# Patient Record
Sex: Female | Born: 1935 | Race: White | Hispanic: No | Smoking: Never smoker
Health system: Southern US, Community
[De-identification: ages and names within clinical notes are randomized; demographics above are authoritative.]

## PROBLEM LIST (undated history)

## (undated) DIAGNOSIS — R011 Cardiac murmur, unspecified: Secondary | ICD-10-CM

## (undated) DIAGNOSIS — I429 Cardiomyopathy, unspecified: Secondary | ICD-10-CM

## (undated) DIAGNOSIS — N183 Chronic kidney disease, stage 3 unspecified: Secondary | ICD-10-CM

## (undated) DIAGNOSIS — C55 Malignant neoplasm of uterus, part unspecified: Secondary | ICD-10-CM

## (undated) DIAGNOSIS — I482 Chronic atrial fibrillation, unspecified: Secondary | ICD-10-CM

## (undated) DIAGNOSIS — Z860100 Personal history of colon polyps, unspecified: Secondary | ICD-10-CM

## (undated) DIAGNOSIS — I499 Cardiac arrhythmia, unspecified: Secondary | ICD-10-CM

## (undated) DIAGNOSIS — I340 Nonrheumatic mitral (valve) insufficiency: Secondary | ICD-10-CM

## (undated) DIAGNOSIS — I071 Rheumatic tricuspid insufficiency: Secondary | ICD-10-CM

## (undated) DIAGNOSIS — I739 Peripheral vascular disease, unspecified: Secondary | ICD-10-CM

## (undated) DIAGNOSIS — I779 Disorder of arteries and arterioles, unspecified: Secondary | ICD-10-CM

## (undated) DIAGNOSIS — I493 Ventricular premature depolarization: Secondary | ICD-10-CM

## (undated) DIAGNOSIS — I10 Essential (primary) hypertension: Secondary | ICD-10-CM

## (undated) DIAGNOSIS — E785 Hyperlipidemia, unspecified: Secondary | ICD-10-CM

## (undated) DIAGNOSIS — I83813 Varicose veins of bilateral lower extremities with pain: Secondary | ICD-10-CM

## (undated) DIAGNOSIS — E039 Hypothyroidism, unspecified: Secondary | ICD-10-CM

## (undated) DIAGNOSIS — I5022 Chronic systolic (congestive) heart failure: Secondary | ICD-10-CM

## (undated) HISTORY — DX: Chronic kidney disease, stage 3 unspecified: N18.30

## (undated) HISTORY — DX: Peripheral vascular disease, unspecified: I73.9

## (undated) HISTORY — DX: Cardiomyopathy, unspecified: I42.9

## (undated) HISTORY — PX: APPENDECTOMY: SHX54

## (undated) HISTORY — PX: EYE SURGERY: SHX253

## (undated) HISTORY — DX: Ventricular premature depolarization: I49.3

## (undated) HISTORY — DX: Nonrheumatic mitral (valve) insufficiency: I34.0

## (undated) HISTORY — PX: PARTIAL COLECTOMY: SHX5273

## (undated) HISTORY — DX: Hyperlipidemia, unspecified: E78.5

## (undated) HISTORY — DX: Personal history of colon polyps, unspecified: Z86.0100

## (undated) HISTORY — DX: Chronic systolic (congestive) heart failure: I50.22

## (undated) HISTORY — DX: Personal history of colonic polyps: Z86.010

## (undated) HISTORY — DX: Chronic atrial fibrillation, unspecified: I48.20

## (undated) HISTORY — PX: CATARACT EXTRACTION W/ INTRAOCULAR LENS IMPLANT: SHX1309

## (undated) HISTORY — DX: Chronic kidney disease, stage 3 (moderate): N18.3

## (undated) HISTORY — PX: ABDOMINAL HYSTERECTOMY: SHX81

## (undated) HISTORY — PX: COLON SURGERY: SHX602

## (undated) HISTORY — DX: Disorder of arteries and arterioles, unspecified: I77.9

## (undated) HISTORY — PX: ABDOMINAL HYSTERECTOMY: SUR658

## (undated) HISTORY — DX: Malignant neoplasm of uterus, part unspecified: C55

## (undated) HISTORY — PX: TONSILLECTOMY: SUR1361

## (undated) HISTORY — DX: Rheumatic tricuspid insufficiency: I07.1

## (undated) HISTORY — DX: Essential (primary) hypertension: I10

---

## 2007-03-27 ENCOUNTER — Ambulatory Visit: Payer: Self-pay | Admitting: Cardiology

## 2007-04-04 ENCOUNTER — Ambulatory Visit: Payer: Self-pay

## 2007-04-04 ENCOUNTER — Encounter: Payer: Self-pay | Admitting: Cardiology

## 2007-04-26 ENCOUNTER — Ambulatory Visit: Payer: Self-pay | Admitting: Cardiology

## 2010-01-14 DIAGNOSIS — I10 Essential (primary) hypertension: Secondary | ICD-10-CM

## 2010-01-14 DIAGNOSIS — E785 Hyperlipidemia, unspecified: Secondary | ICD-10-CM | POA: Insufficient documentation

## 2010-01-15 ENCOUNTER — Ambulatory Visit: Payer: Self-pay | Admitting: Cardiology

## 2010-01-15 DIAGNOSIS — R002 Palpitations: Secondary | ICD-10-CM

## 2010-01-27 ENCOUNTER — Ambulatory Visit: Payer: Self-pay | Admitting: Cardiology

## 2010-01-27 DIAGNOSIS — R0989 Other specified symptoms and signs involving the circulatory and respiratory systems: Secondary | ICD-10-CM | POA: Insufficient documentation

## 2010-01-27 DIAGNOSIS — I482 Chronic atrial fibrillation, unspecified: Secondary | ICD-10-CM

## 2010-01-27 DIAGNOSIS — I498 Other specified cardiac arrhythmias: Secondary | ICD-10-CM

## 2010-01-28 ENCOUNTER — Telehealth: Payer: Self-pay | Admitting: Cardiology

## 2010-02-08 ENCOUNTER — Telehealth (INDEPENDENT_AMBULATORY_CARE_PROVIDER_SITE_OTHER): Payer: Self-pay | Admitting: *Deleted

## 2010-02-09 ENCOUNTER — Encounter: Payer: Self-pay | Admitting: Cardiology

## 2010-02-09 ENCOUNTER — Ambulatory Visit: Payer: Self-pay

## 2010-02-09 ENCOUNTER — Encounter (HOSPITAL_COMMUNITY): Admission: RE | Admit: 2010-02-09 | Discharge: 2010-04-23 | Payer: Self-pay | Admitting: Cardiology

## 2010-02-09 ENCOUNTER — Ambulatory Visit: Payer: Self-pay | Admitting: Cardiovascular Disease

## 2010-02-09 ENCOUNTER — Ambulatory Visit (HOSPITAL_COMMUNITY): Admission: RE | Admit: 2010-02-09 | Discharge: 2010-02-09 | Payer: Self-pay | Admitting: Gastroenterology

## 2010-02-10 ENCOUNTER — Ambulatory Visit: Payer: Self-pay | Admitting: Cardiology

## 2010-02-13 ENCOUNTER — Telehealth: Payer: Self-pay | Admitting: Physician Assistant

## 2010-02-15 ENCOUNTER — Ambulatory Visit: Payer: Self-pay | Admitting: Cardiovascular Disease

## 2010-02-15 ENCOUNTER — Telehealth: Payer: Self-pay | Admitting: Cardiology

## 2010-02-15 LAB — CONVERTED CEMR LAB

## 2010-02-17 ENCOUNTER — Telehealth (INDEPENDENT_AMBULATORY_CARE_PROVIDER_SITE_OTHER): Payer: Self-pay | Admitting: *Deleted

## 2010-02-19 ENCOUNTER — Ambulatory Visit: Payer: Self-pay | Admitting: Internal Medicine

## 2010-02-19 LAB — CONVERTED CEMR LAB: POC INR: 2

## 2010-02-25 ENCOUNTER — Ambulatory Visit: Payer: Self-pay | Admitting: Cardiology

## 2010-02-25 LAB — CONVERTED CEMR LAB: POC INR: 3

## 2010-03-03 ENCOUNTER — Ambulatory Visit: Payer: Self-pay | Admitting: Cardiology

## 2010-03-11 ENCOUNTER — Ambulatory Visit: Payer: Self-pay | Admitting: Internal Medicine

## 2010-03-31 ENCOUNTER — Encounter: Payer: Self-pay | Admitting: Cardiology

## 2010-03-31 ENCOUNTER — Ambulatory Visit: Payer: Self-pay | Admitting: Cardiology

## 2010-07-13 ENCOUNTER — Ambulatory Visit: Payer: Self-pay | Admitting: Cardiology

## 2010-08-04 ENCOUNTER — Telehealth: Payer: Self-pay | Admitting: Cardiology

## 2010-10-06 ENCOUNTER — Telehealth: Payer: Self-pay | Admitting: Cardiology

## 2010-10-12 ENCOUNTER — Telehealth: Payer: Self-pay | Admitting: Cardiology

## 2010-11-21 LAB — CONVERTED CEMR LAB
AST: 24 units/L (ref 0–37)
Albumin: 3.9 g/dL (ref 3.5–5.2)
BUN: 18 mg/dL (ref 6–23)
Basophils Absolute: 0 10*3/uL (ref 0.0–0.1)
CO2: 31 meq/L (ref 19–32)
Chloride: 106 meq/L (ref 96–112)
Eosinophils Absolute: 0.1 10*3/uL (ref 0.0–0.7)
GFR calc non Af Amer: 73.85 mL/min (ref >60)
Glucose, Bld: 90 mg/dL (ref 70–99)
HCT: 41.4 % (ref 36.0–46.0)
Hemoglobin: 13.9 g/dL (ref 12.0–15.0)
Lymphs Abs: 1.3 10*3/uL (ref 0.7–4.0)
MCHC: 33.7 g/dL (ref 30.0–36.0)
MCV: 97 fL (ref 78.0–100.0)
Monocytes Absolute: 0.5 10*3/uL (ref 0.1–1.0)
Neutro Abs: 3.7 10*3/uL (ref 1.4–7.7)
Platelets: 184 10*3/uL (ref 150.0–400.0)
Potassium: 3.9 meq/L (ref 3.5–5.1)
RDW: 12.1 % (ref 11.5–14.6)
Sodium: 144 meq/L (ref 135–145)
TSH: 3.56 microintl units/mL (ref 0.35–5.50)
Total Bilirubin: 0.7 mg/dL (ref 0.3–1.2)

## 2010-11-24 NOTE — Medication Information (Signed)
Summary: rov/tm  Anticoagulant Therapy  Managed by: Charolotte Eke, PharmD Referring MD: B.Juanda Chance PCP: Dr Marjory Sneddon MD: Shirlee Latch MD, Freida Busman Indication 1: Atrial Fibrillation Lab Used: [facility] [place] Site: [address] INR POC 3.0 INR RANGE 2.0-3.0  Dietary changes: no    Health status changes: no    Bleeding/hemorrhagic complications: no    Recent/future hospitalizations: no    Any changes in medication regimen? no    Recent/future dental: no  Any missed doses?: no       Is patient compliant with meds? yes      Comments: Interested in being dosed by Dr. Gwendlyn Deutscher at [facility] in [place].  Also, interested in reading about Pradaxa.  Gave her Carenotes.  Current Medications (verified): 1)  Crestor 10 Mg Tabs (Rosuvastatin Calcium) .... Take One Tablet By Mouth Daily. 2)  Cardizem Cd 180 Mg Xr24h-Cap (Diltiazem Hcl Coated Beads) .... Take One Tab By Mouth Once Daily 3)  Fish Oil 1200mg  .... 1 Tab Once Daily 4)  Calicum With Vit D .... As Needed 5)  Vitamin C .... As Needed 6)  Co Q 10 .... Prn 7)  Coumadin 5 Mg Tabs (Warfarin Sodium) .... Take One Tab By Mouth Once Daily As Directed 8)  Cozaar 50 Mg Tabs (Losartan Potassium) .... Take 1 Tablet By Mouth Once A Day For Blood Pressure 9)  Aspirin 81 Mg Tbec (Aspirin) .... Take One Tablet By Mouth Daily 10)  Flecainide Acetate 50 Mg Tabs (Flecainide Acetate) .... Take One Tablet By Mouth Every 12 Hours  Allergies (verified): 1)  ! * Tussionex  Anticoagulation Management History:      The patient is taking warfarin and comes in today for a routine follow up visit.  Positive risk factors for bleeding include an age of 75 years or older.  The bleeding index is 'intermediate risk'.  Positive CHADS2 values include History of HTN and Age > 50 years old.  Anticoagulation responsible provider: Shirlee Latch MD, Gidget Quizhpi.  INR POC: 3.0.  Cuvette Lot#: [id number].  Exp: 03/2011.     Anticoagulation Management Assessment/Plan:      The patient's current anticoagulation dose is Coumadin 5 mg tabs: take one tab by mouth once daily as directed.  The target INR is 2.0-3.0.  The next INR is due 03/11/2010.  Anticoagulation instructions were given to patient.  Results were reviewed/authorized by Charolotte Eke, PharmD.  She was notified by Charolotte Eke, PharmD.         Prior Anticoagulation Instructions: INR 2.0 Continue 5mg s daily. Reccheck in one week.   Current Anticoagulation Instructions: Continue 5mg  daily except take 2.5mg  every Thurs.

## 2010-11-24 NOTE — Assessment & Plan Note (Signed)
Summary: f2y   Visit Type:  Follow-up Primary Provider:  Dr Jeanie Sewer  CC:  fatigue.  History of Present Illness: the patient is 77 years old returned for a followup visit because of symptoms of palpitations. I saw her last in July of 2008. She had hypertension and left bundle branch block and symptoms of fatigue. We have added her with a Myoview scan which was negative and an echocardiogram which was normal.  She's done fairly well since that time but says over the last 6-12 months she has had recurrent symptoms of palpitations. She notices this most when she takes her blood pressure. These may occur every 2 weeks. She's also had fatigue but this has improved over the last 2 weeks. She's had no chest pain.  Problems Prior to Update: 1)  Palpitations  (ICD-785.1) 2)  Hyperlipidemia  (ICD-272.4) 3)  Hypertension  (ICD-401.9)  Current Medications (verified): 1)  Lisinopril-Hydrochlorothiazide 20-25 Mg Tabs (Lisinopril-Hydrochlorothiazide) .... 1/4 Tab By Mouth Once Daily 2)  Aspirin Ec 325 Mg Tbec (Aspirin) .... As Needed  Past History:  Past Medical History: Reviewed history from 01/14/2010 and no changes required. Current Problems:  HYPERLIPIDEMIA (ICD-272.4) HYPERTENSION (ICD-401.9)  Vital Signs:  Patient profile:   77 year old female Height:      65 inches Weight:      132 pounds BMI:     22.05 Pulse rate:   59 / minute Resp:     12 per minute BP sitting:   160 / 90  (left arm)  Vitals Entered By: Kem Parkinson (January 15, 2010 11:39 AM)   Other Orders: EKG w/ Interpretation (93000) Event (Event) TLB-BMP (Basic Metabolic Panel-BMET) (80048-METABOL) TLB-CBC Platelet - w/Differential (85025-CBCD) TLB-Hepatic/Liver Function Pnl (80076-HEPATIC) TLB-TSH (Thyroid Stimulating Hormone) (63875-IEP)  Patient Instructions: 1)  Your physician recommends that you schedule a follow-up appointment in: 4 weeks. 2)  Your physician has recommended that you wear an event monitor.   Event monitors are medical devices that record the heart's electrical activity. Doctors most often use these monitors to diagnose arrhythmias. Arrhythmias are problems with the speed or rhythm of the heartbeat. The monitor is a small, portable device. You can wear one while you do your normal daily activities. This is usually used to diagnose what is causing palpitations/syncope (passing out). 3)  Your physician recommends that you have lab work today: cbc/bmp/liver/tsh (785.1;402.10;780.79)  Appended Document: f2y Impression 1.  Palpitations:  Etiol unclear.  We will evaluate with event monitor 2.  Hypertensio:  Well controlled on current meds. 3.  Hyperlipidemia:   Currently managed with statin

## 2010-11-24 NOTE — Assessment & Plan Note (Signed)
Summary: new afib on event.amber   Visit Type:  Follow-up Primary Provider:  Dr Jeanie Sewer  CC:  new on se afib.  History of Present Illness: H. and is 77 years old and returned for evaluation and management of palpitations. I saw her a few weeks ago for symptoms of palpitations and we arranged for her to have an event monitor. This showed runs of SVT some of which were irregular and some of which were regular. Her baseline QRS has a wide complex. I'm not sure where the arrhythmia represents a multifocally to tachycardia and a reentrant supraventricular tachycardia or atrial flutter or whether this represents atrial fibrillation.  She is not having too many symptoms from the palpitations but when they occur they last 5-10 minutes.  She has a fairly high risk profile for vascular disease. She has hypertension and hyperlipidemia and she has a son who died of a heart attack in his 30s. She also has a right carotid bruit.  Current Medications (verified): 1)  Lisinopril-Hydrochlorothiazide 20-25 Mg Tabs (Lisinopril-Hydrochlorothiazide) .... 1/4 Tab By Mouth Once Daily 2)  Aspirin Ec 325 Mg Tbec (Aspirin) .... As Needed  Allergies (verified): No Known Drug Allergies  Past History:  Past Medical History: Reviewed history from 01/14/2010 and no changes required. Current Problems:  HYPERLIPIDEMIA (ICD-272.4) HYPERTENSION (ICD-401.9)  Review of Systems       ROS is negative except as outlined in HPI.   Vital Signs:  Patient profile:   77 year old female Height:      65 inches Weight:      135 pounds Pulse rate:   71 / minute BP sitting:   182 / 85 Cuff size:   regular  Vitals Entered By: Burnett Kanaris, CNA (January 27, 2010 3:48 PM)  Physical Exam  Additional Exam:  Gen. Well-nourished, in no distress   Neck: No JVD, thyroid not enlarged, right carotid bruit Lungs: No tachypnea, clear without rales, rhonchi or wheezes Cardiovascular: Rhythm regular, PMI not displaced,  heart sounds   normal, no murmurs or gallops, no peripheral edema, pulses normal in all 4 extremities. Abdomen: BS normal, abdomen soft and non-tender without masses or organomegaly, no hepatosplenomegaly. MS: No deformities, no cyanosis or clubbing   Neuro:  No focal sns   Skin:  no lesions    Impression & Recommendations:  Problem # 1:  SUPRAVENTRICULAR TACHYCARDIA (ICD-427.89) She has palpitations and documented supraventricular tachycardia on event monitor tracings. I'm not certain whether this represents a multifocal atrial tachycardia plus a reentrant SVT or atrial flutter or whether this represents some atrial fibrillation. We will continue the event monitor and get some more tracings. We will start her on Cardizem CD 180 daily and will start low-dose lisinopril hydrochlorothiazide. We will continue the aspirin and we will consider Coumadin after we have some more tracings. She has some reluctance against Coumadin and we discussed Pradaxa  as well.  We will also reevaluate her for structural ischemic heart disease. We will get a exercise stress test Myoview scan, a 2-D echocardiogram.  Her updated medication list for this problem includes:    Lisinopril-hydrochlorothiazide 20-25 Mg Tabs (Lisinopril-hydrochlorothiazide) .Marland Kitchen... 1/4 tab by mouth once daily    Aspirin Ec 325 Mg Tbec (Aspirin) .Marland Kitchen... As needed  Problem # 2:  CAROTID BRUIT, RIGHT (ICD-785.9) Shas a right carotid bruit and we'll get carotid Dopplers. Orders: Carotid Duplex (Carotid Duplex)  Problem # 3:  HYPERTENSION (ICD-401.9)  Hhypertension is poorly controlled. I think we can use a calcium  channel blocker both for her blood pressure and for treatment of her arrhythmia and we'll start Cardizem CD 180 daily and titrate as needed. The following medications were removed from the medication list:    Lisinopril-hydrochlorothiazide 20-25 Mg Tabs (Lisinopril-hydrochlorothiazide) .Marland Kitchen... 1/4 tab by mouth once daily Her updated medication list  for this problem includes:    Aspirin Ec 325 Mg Tbec (Aspirin) .Marland Kitchen... As needed    Cardizem Cd 180 Mg Xr24h-cap (Diltiazem hcl coated beads) .Marland Kitchen... Take one tab by mouth once daily  Orders: Nuclear Stress Test (Nuc Stress Test) Echocardiogram (Echo) Carotid Duplex (Carotid Duplex)  The following medications were removed from the medication list:    Lisinopril-hydrochlorothiazide 20-25 Mg Tabs (Lisinopril-hydrochlorothiazide) .Marland Kitchen... 1/4 tab by mouth once daily Her updated medication list for this problem includes:    Aspirin Ec 325 Mg Tbec (Aspirin) .Marland Kitchen... As needed    Cardizem Cd 180 Mg Xr24h-cap (Diltiazem hcl coated beads) .Marland Kitchen... Take one tab by mouth once daily  Problem # 4:  HYPERLIPIDEMIA (ICD-272.4) She was told by Dr. Jeanie Sewer that her cholesterol is greater than 300 and he gave her some Crestor samples. She was concerned that this might affect her liver and didn't take it. She has a very strong risk profile has a carotid bruit venous engorgement she be treated with a statin and I encouraged her to begin the Crestor. Her updated medication list for this problem includes:    Crestor 10 Mg Tabs (Rosuvastatin calcium) .Marland Kitchen... Take one tablet by mouth daily.  Patient Instructions: 1)  Your physician recommends that you schedule a follow-up appointment in: 2 weeks as scheduled. 2)  Your physician has recommended you make the following change in your medication: 1) STOP lisinopril/hctz, 2) START Cardizem CD 180mg  once daily 3)  Your physician has requested that you have a carotid duplex. This test is an ultrasound of the carotid arteries in your neck. It looks at blood flow through these arteries that supply the brain with blood. Allow one hour for this exam. There are no restrictions or special instructions. 4)  Your physician has requested that you have an echocardiogram.  Echocardiography is a painless test that uses sound waves to create images of your heart. It provides your doctor with  information about the size and shape of your heart and how well your heart's chambers and valves are working.  This procedure takes approximately one hour. There are no restrictions for this procedure. 5)  Your physician has requested that you have an exercise stress myoview.  For further information please visit [email or web address]  Please follow instruction sheet, as given. Prescriptions: CRESTOR 10 MG TABS (ROSUVASTATIN CALCIUM) Take one tablet by mouth daily.  #35 x 0   Entered by:   Sherri Rad, RN, BSN   Authorized by:   Lenoria Farrier, MD, [facility]   Signed by:   Lenoria Farrier, MD, [facility] on 01/27/2010   Method used:   Samples Given   RxID:   [id number] CARDIZEM CD 180 MG XR24H-CAP (DILTIAZEM HCL COATED BEADS) take one tab by mouth once daily  #30 x 6   Entered by:   Sherri Rad, RN, BSN   Authorized by:   Lenoria Farrier, MD, [facility]   Signed by:   Sherri Rad, RN, BSN on 01/27/2010   Method used:   Electronically to        [facility]. [phone]* (retail)       [address]  [place], [place]  [address]       Ph: [phone] or [phone]       Fax: [phone]   RxID:   [id number]

## 2010-11-24 NOTE — Progress Notes (Signed)
Summary: MEDICATION MAKING PT DIZZY   Phone Note Call from Patient Call back at Home Phone [phone]   Caller: Patient Summary of Call: PT CALLING ABOUT MEDICATION  HER DIZZY (CARTIA) Initial call taken by: Judie Grieve,  January 28, 2010 4:51 PM  Follow-up for Phone Call        I called and spoke with the pt. She states she took her Cartia this morning about 9:30am/ 10:00am. She has had bouts of severe dizziness today that are intermittent. They have improved through the afternoon. The pt states she took her BP a few minutes ago and it was 142/82 and she states her HR was good. She was not dizzy when she took her most recent BP. I instructed the pt to take her bp tonight if she feels an episode of dizziness setting in. I will call her in the morning to find out how her readings are and then we will decide about her meds.  Follow-up by: Sherri Rad, RN, BSN,  January 28, 2010 5:25 PM  Additional Follow-up for Phone Call Additional follow up Details #1::        I called and spoke with the pt this morning to followup on her BP readings. She took several readings last night. 5:45pm- 171/95, 6:45pm- 186/98, 7:45pm- 201/98, 8:45pm- 207/107& this morning at  8:3am- 203/101. She just recently rechecked her bp and it was 154/85. The pt states her HR's have averaged between 62-69 bpm. The pt has not taken her Cartia this morning. She does feel much better than yesterday, but still feels a little dizzy. The pt also states that when she was taking the lisinopril/hctz 20/25mg  tab, that she could only take a 1/4 of a tablet due to weakness. I explained to the pt I will address this with the DOD and call her back. Additional Follow-up by: Sherri Rad, RN, BSN,  January 29, 2010 10:47 AM    Additional Follow-up for Phone Call Additional follow up Details #2::    I discussed the pt's situation with Dr. Graciela Husbands and had him review the pt's strips from her event monitor. Since the pt has elevated BP  readings and also has what looks like an atrial tach per Dr. Graciela Husbands, orders from Dr. Graciela Husbands are as follows: 1) have the pt take her Cardizem 180mg  evening night before bed. If she still feels bad, the pt should call on monday. 2) The next step would be to put her back on lisinopril/hctz 20/25mg  1/4 tab two times a day, if that is not good for her then 3) try diltiazem 30mg  three times a day, & a last resort would be 4) flecainide 30mg . I have attempted to call the pt about this. I have left a message that I will try to call her back. Sherri Rad, RN, BSN  January 29, 2010 11:14 AM   I called the pt back and made her aware to take her Cardizem CD 180mg  at bedtime per Dr. Graciela Husbands. I also explained to her that if she does not feel well on monday to please call us and we will try another option per Dr. Graciela Husbands. The pt is agreeable. Follow-up by: Sherri Rad, RN, BSN,  January 29, 2010 3:38 PM

## 2010-11-24 NOTE — Progress Notes (Signed)
Summary: refill  Phone Note Refill Request Message from:  Patient on August 04, 2010 8:52 AM  Refills Requested: Medication #1:  COUMADIN 5 MG TABS take one tab by mouth once daily as directed [facility] [phone]  Initial call taken by: Judie Grieve,  August 04, 2010 8:52 AM  Follow-up for Phone Call        Spoke with pt and she states she is still f/u with [facility] in [place] for INR. Thus, she was instructed that they would have to refill due to the fact that they were now dosing. Understanding verbalized.  Follow-up by: Bethena Midget, RN, BSN,  August 04, 2010 4:48 PM

## 2010-11-24 NOTE — Medication Information (Signed)
Summary: rov-tp  Anticoagulant Therapy  Managed by: Eda Keys, PharmD Referring MD: B.Juanda Chance PCP: Dr Marjory Sneddon MD: Gala Romney MD, Reuel Boom Indication 1: Atrial Fibrillation Lab Used: [facility] [place] Site: [address] INR POC 2.2 INR RANGE 2.0-3.0  Dietary changes: no    Health status changes: no    Bleeding/hemorrhagic complications: no    Recent/future hospitalizations: no    Any changes in medication regimen? no    Recent/future dental: no  Any missed doses?: no       Is patient compliant with meds? yes       Allergies: 1)  ! * Tussionex  Anticoagulation Management History:      The patient is taking warfarin and comes in today for a routine follow up visit.  Positive risk factors for bleeding include an age of 75 years or older.  The bleeding index is 'intermediate risk'.  Positive CHADS2 values include History of HTN and Age > 36 years old.  Anticoagulation responsible provider: Caroll Cunnington MD, Reuel Boom.  INR POC: 2.2.  Cuvette Lot#: [id number].  Exp: 05/2011.    Anticoagulation Management Assessment/Plan:      The patient's current anticoagulation dose is Coumadin 5 mg tabs: take one tab by mouth once daily as directed.  The target INR is 2.0-3.0.  The next INR is due 03/31/2010.  Anticoagulation instructions were given to patient.  Results were reviewed/authorized by Eda Keys, PharmD.  She was notified by Eda Keys.         Prior Anticoagulation Instructions: Continue 5mg  daily except take 2.5mg  every Thurs.  Current Anticoagulation Instructions: INR 2.2  Continue taking 1/2 tablet on Thursday and 1 tablet all other days.  Return to clinic in 3 weeks.

## 2010-11-24 NOTE — Assessment & Plan Note (Signed)
Summary: per check out   Primary Provider:  Dr Jeanie Sewer  CC:  check up.  History of Present Illness: The patient is 77 years old and return for management of atrial fibrillation and hypertension. She was recently found to have paroxysmal atrial fibrillation. We evaluated her with a Myoview scan and echocardiogram which were normal. We started her on flecainide and Cardizem but she's been very noncompliant with these medications and with Coumadin.  She says she's been feeling well has had no major recurrences recently.    Her other problems include hypertension, hyperlipidemia, and a carotid bruit.  Her husband sees Dr. Antoine Poche on so we will arrange follow up with Dr. Antoine Poche next year for Mrs. Keidel.  Current Medications (verified): 1)  Cardizem Cd 240 Mg Xr24h-Cap (Diltiazem Hcl Coated Beads) .Marland Kitchen.. 1 Tab Every 3 or 4 Days 2)  Fish Oil 1200mg  .... 1 Tab Once Daily 3)  Calicum With Vit D .... As Needed 4)  Vitamin C .... As Needed 5)  Coumadin 5 Mg Tabs (Warfarin Sodium) .... Take One Tab By Mouth Once Daily As Directed 6)  Cozaar 50 Mg Tabs (Losartan Potassium) .... 3 or 4 Times Weekly 7)  Aspirin 81 Mg Tbec (Aspirin) .... Take One Tablet By Mouth Pt Does Not Take Asa Everyday(Prn) 8)  Flecainide Acetate 50 Mg Tabs (Flecainide Acetate) .Marland Kitchen.. 1 Tab 3 or 4 Times Weekly 9)  Vitamin B12 .... As Needed  Allergies: 1)  ! * Tussionex  Past History:  Family History: Last updated: 01/14/2010 .       Positive for cardiovascular disease.  She has a father   who died at age 32 of a heart attack and a son who died at age 40 of a   heart attack.  Her mother lived to be 20.     age.   Social History: Last updated: 01/14/2010   She is married and has five children.  She had one son   who played football at [employer] who died of a heart attack at a young   age.   Past Medical History: Reviewed history from 01/14/2010 and no changes required. Current Problems:  HYPERLIPIDEMIA  (ICD-272.4) HYPERTENSION (ICD-401.9)  Review of Systems       ROS is negative except as outlined in HPI.   Vital Signs:  Patient profile:   77 year old female Height:      64 inches Weight:      127 pounds BMI:     21.88 Pulse rate:   61 / minute Resp:     14 per minute BP sitting:   154 / 77  (left arm)  Vitals Entered By: Kem Parkinson (July 13, 2010 9:32 AM)  Physical Exam  Additional Exam:  Gen. Well-nourished, in no distress   Neck: No JVD, thyroid not enlarged, no carotid bruits Lungs: No tachypnea, clear without rales, rhonchi or wheezes Cardiovascular: Rhythm regular, PMI not displaced,  heart sounds  normal, no murmurs or gallops, no peripheral edema, pulses normal in all 4 extremities. Abdomen: BS normal, abdomen soft and non-tender without masses or organomegaly, no hepatosplenomegaly. MS: No deformities, no cyanosis or clubbing   Neuro:  No focal sns   Skin:  no lesions    Impression & Recommendations:  Problem # 1:  ATRIAL FIBRILLATION (ICD-427.31) She has had no recent symptomatic recurrences. Unfortunately she has been noncompliant to her Cardizem but denied and Coumadin. I explained to her that with paroxysmal atrial fibrillation and being 77  years old and having hypertension and being a woman that her risk of stroke is about 30-40% over the next 10 years.for compliance reasons we agreed to stop the flecainide and I encouraged her to take the Cardizem regularly and to take the Coumadin regularly and have it monitored regularly The following medications were removed from the medication list:    Flecainide Acetate 50 Mg Tabs (Flecainide acetate) .Marland Kitchen... Take one tablet by mouth every 12 hours Her updated medication list for this problem includes:    Coumadin 5 Mg Tabs (Warfarin sodium) .Marland Kitchen... Take one tab by mouth once daily as directed    Aspirin 81 Mg Tbec (Aspirin) .Marland Kitchen... Take one tablet by mouth pt does not take asa everyday(prn)  The following  medications were removed from the medication list:    Flecainide Acetate 50 Mg Tabs (Flecainide acetate) .Marland Kitchen... Take one tablet by mouth every 12 hours Her updated medication list for this problem includes:    Coumadin 5 Mg Tabs (Warfarin sodium) .Marland Kitchen... Take one tab by mouth once daily as directed    Aspirin 81 Mg Tbec (Aspirin) .Marland Kitchen... Take one tablet by mouth pt does not take asa everyday(prn)  Problem # 2:  HYPERTENSION (ICD-401.9) Her blood pressure slightly elevated today. If she takes her Cardizem regularly hopefully this will be under better control. Her updated medication list for this problem includes:    Cardizem Cd 240 Mg Xr24h-cap (Diltiazem hcl coated beads) .Marland Kitchen... 1 tab every 3 or 4 days    Cozaar 50 Mg Tabs (Losartan potassium) .Marland KitchenMarland KitchenMarland KitchenMarland Kitchen 3 or 4 times weekly    Aspirin 81 Mg Tbec (Aspirin) .Marland Kitchen... Take one tablet by mouth pt does not take asa everyday(prn)  Other Orders: EKG w/ Interpretation (93000)  Patient Instructions: 1)  Stop flecainide. 2)  Please take your cardizem (diltiazem) on a daily basis. 3)  Please take your coumadin (warfarin) on a daily basis. 4)  Your physician wants you to follow-up in: 6 months with Dr. Antoine Poche.  You will receive a reminder letter in the mail two months in advance. If you don't receive a letter, please call our office to schedule the follow-up appointment.

## 2010-11-24 NOTE — Medication Information (Signed)
Summary: rov/tm  Anticoagulant Therapy  Managed by: Bethena Midget, RN, BSN Referring MD: B.Juanda Chance PCP: Dr Marjory Sneddon MD: Gala Romney MD, Reuel Boom Indication 1: Atrial Fibrillation Lab Used: [facility]  Site: [address] INR POC 2.0 INR RANGE 2.0-3.0  Dietary changes: no    Health status changes: no    Bleeding/hemorrhagic complications: no    Recent/future hospitalizations: no    Any changes in medication regimen? no    Recent/future dental: no  Any missed doses?: no       Is patient compliant with meds? yes       Current Medications (verified): 1)  Crestor 10 Mg Tabs (Rosuvastatin Calcium) .... Take One Tablet By Mouth Daily. 2)  Cardizem Cd 180 Mg Xr24h-Cap (Diltiazem Hcl Coated Beads) .... Take One Tab By Mouth Once Daily 3)  Fish Oil 1200mg  .... 1 Tab Once Daily 4)  Calicum With Vit D .... As Needed 5)  Vitamin C .... As Needed 6)  Co Q 10 .... Prn 7)  Coumadin 5 Mg Tabs (Warfarin Sodium) .... Take One Tab By Mouth Once Daily As Directed 8)  Cozaar 50 Mg Tabs (Losartan Potassium) .... Take 1 Tablet By Mouth Once A Day For Blood Pressure 9)  Aspirin 81 Mg Tbec (Aspirin) .... Take One Tablet By Mouth Daily 10)  Flecainide Acetate 50 Mg Tabs (Flecainide Acetate) .... Take One Tablet By Mouth Every 12 Hours  Allergies: 1)  ! * Tussionex  Anticoagulation Management History:      The patient is taking warfarin and comes in today for a routine follow up visit.  Positive risk factors for bleeding include an age of 75 years or older.  The bleeding index is 'intermediate risk'.  Positive CHADS2 values include History of HTN and Age > 58 years old.  Anticoagulation responsible provider: Darcel Zick MD, Reuel Boom.  INR POC: 2.0.  Cuvette Lot#: [id number].  Exp: 03/2011.    Anticoagulation Management Assessment/Plan:      The patient's current anticoagulation dose is Coumadin 5 mg tabs: take one tab by mouth once daily as directed.  The target INR is  2.0-3.0.  The next INR is due 02/25/2010.  Anticoagulation instructions were given to patient.  Results were reviewed/authorized by Bethena Midget, RN, BSN.  She was notified by Bethena Midget, RN, BSN.         Prior Anticoagulation Instructions: INR 1.1 Continue 5mg s daily. Recheck in 4 days.   Current Anticoagulation Instructions: INR 2.0 Continue 5mg s daily. Reccheck in one week.

## 2010-11-24 NOTE — Assessment & Plan Note (Signed)
Summary: Cardiology Nuclear Study  Nuclear Med Background Indications for Stress Test: Evaluation for Ischemia   History: Echo, Myocardial Perfusion Study   Symptoms: DOE, Fatigue, Palpitations    Nuclear Pre-Procedure Cardiac Risk Factors: Family History - CAD, Hypertension, LBBB, Lipids Caffeine/Decaff Intake: None NPO After: 5:30 PM Lungs: clear IV 0.9% NS with Angio Cath: 22g     IV Site: (R) Hand IV Started by: Irean Hong RN Chest Size (in) 36     Cup Size B     Height (in): 64 Weight (lb): 130 BMI: 22.40 Tech Comments: The patient took cardizem at 7:20 am today. Patsy Edwards,RN.  Nuclear Med Study 1 or 2 day study:  1 day     Stress Test Type:  Adenosine Reading MD:  Charlton Haws, MD     Referring MD:  B.Brodie Resting Radionuclide:  Technetium 54m Tetrofosmin     Resting Radionuclide Dose:  11 mCi  Stress Radionuclide:  Technetium 70m Tetrofosmin     Stress Radionuclide Dose:  33 mCi   Stress Protocol  Dose of Adenosine:  33.1 mg    Stress Test Technologist:  Milana Na EMT-P     Nuclear Technologist:  Domenic Polite CNMT  Rest Procedure  Myocardial perfusion imaging was performed at rest 45 minutes following the intravenous administration of Myoview Technetium 94m Tetrofosmin.  Stress Procedure  The patient received IV adenosine at 140 mcg/kg/min for 4 minutes. 2nd degree avb with infusion. There were no significant changes with infusion. Myoview was injected at the 2 minute mark and quantitative spect images were obtained after a 45 minute delay.  QPS Raw Data Images:  Normal; no motion artifact; normal heart/lung ratio. Stress Images:  NI: Uniform and normal uptake of tracer in all myocardial segments. Rest Images:  Normal homogeneous uptake in all areas of the myocardium. Subtraction (SDS):  Normal Transient Ischemic Dilatation:  .93  (Normal <1.22)  Lung/Heart Ratio:  .24  (Normal <0.45)  Quantitative Gated Spect Images QGS EDV:  49 ml QGS  ESV:  10 ml QGS EF:  80 % QGS cine images:  normal  Findings Normal nuclear study      Overall Impression  Exercise Capacity: Adenosine study with no exercise. BP Response: Normal blood pressure response. Clinical Symptoms: Dyspnea ECG Impression: LBBB Overall Impression: Normal stress nuclear study.  Appended Document: Cardiology Nuclear Study Discussed with the pt at her office visit today.

## 2010-11-24 NOTE — Assessment & Plan Note (Signed)
Summary: ROV   Visit Type:  Follow-up Primary Provider:  Dr Jeanie Sewer  CC:  pt continues to have irregular heart beats.  History of Present Illness: Patient is 77 years old and return for management of atrial fibrillation. She developed tachycardia palpitations which we documented on event monitor to be atrial fibrillation. Initially had planned to start her on amiodarone but she read about the side effects and was frayed take this so we started on flecainide 50 mg twice a day. She has been on this about 2 weeks. She had one episode on "Sunday which lasted most of the day with tachycardia palpitations. She's doing better since that time. She says she was not sure she was taking medicine regularly.  Her other major medical problems include hypertension and hyperlipidemia. She also has a carotid bruit. She has been previously evaluated with a Myoview scan and echo which were normal.  Current Medications (verified): 1)  Crestor 10 Mg Tabs (Rosuvastatin Calcium) .... Take One Tablet By Mouth Daily. 2)  Cardizem Cd 180 Mg Xr24h-Cap (Diltiazem Hcl Coated Beads) .... Take One Tab By Mouth Once Daily 3)  Fish Oil 1200mg .... 1 Tab Once Daily 4)  Calicum With Vit D .... As Needed 5)  Vitamin C .... As Needed 6)  Co Q 10 .... Prn 7)  Coumadin 5 Mg Tabs (Warfarin Sodium) .... Take One Tab By Mouth Once Daily As Directed 8)  Cozaar 50 Mg Tabs (Losartan Potassium) .... Take 1 Tablet By Mouth Once A Day For Blood Pressure 9)  Aspirin 81 Mg Tbec (Aspirin) .... Take One Tablet By Mouth Pt Does Not Take Asa Everyday(Prn) 10)  Flecainide Acetate 50 Mg Tabs (Flecainide Acetate) .... Take One Tablet By Mouth Every 12 Hours  Allergies (verified): 1)  ! * Tussionex  Past History:  Past Medical History: Reviewed history from 01/14/2010 and no changes required. Current Problems:  HYPERLIPIDEMIA (ICD-272.4) HYPERTENSION (ICD-401.9)  Review of Systems       ROS is negative except as outlined in HPI.    Vital Signs:  Patient profile:   77 year old female Height:      64 inches Weight:      131 pounds BMI:     22" .57 Pulse rate:   72 / minute BP sitting:   157 / 79  (left arm) Cuff size:   regular  Vitals Entered By: Burnett Kanaris, CNA (Mar 03, 2010 12:07 PM)  Physical Exam  Additional Exam:  Gen. Well-nourished, in no distress   Neck: No JVD, thyroid not enlarged, no carotid bruits Lungs: No tachypnea, clear without rales, rhonchi or wheezes Cardiovascular: Rhythm regular, PMI not displaced,  heart sounds  normal, no murmurs or gallops, no peripheral edema, pulses normal in all 4 extremities. Abdomen: BS normal, abdomen soft and non-tender without masses or organomegaly, no hepatosplenomegaly. MS: No deformities, no cyanosis or clubbing   Neuro:  No focal sns   Skin:  no lesions    Impression & Recommendations:  Problem # 1:  ATRIAL FIBRILLATION (ICD-427.31)  She has paroxysmally defibrillation and was recently started on flecainide. She has had one recurrence over the first 2 weeks.  She is not sure she was taking the flecainide regularly. I encouraged her to do this and we will increase her Cardizem from 180-240 daily. We'll plan to have her come back for a stress test in 4 weeks to assess the safety of flecainide. I told her to be sure and call us if she has  significant recurrences between now and then in which case we might adjust the flecainide dose upwards.  She is [place] score 2 and is taking Coumadin. Her updated medication list for this problem includes:    Coumadin 5 Mg Tabs (Warfarin sodium) .Marland Kitchen... Take one tab by mouth once daily as directed    Aspirin 81 Mg Tbec (Aspirin) .Marland Kitchen... Take one tablet by mouth pt does not take asa everyday(prn)    Flecainide Acetate 50 Mg Tabs (Flecainide acetate) .Marland Kitchen... Take one tablet by mouth every 12 hours  Orders: EKG w/ Interpretation (93000) Treadmill (Treadmill)  Problem # 2:  HYPERTENSION (ICD-401.9)  Her updated medication  list for this problem includes:    Cardizem Cd 240 Mg Xr24h-cap (Diltiazem hcl coated beads) .Marland Kitchen... Take one capsule by mouth once daily    Cozaar 50 Mg Tabs (Losartan potassium) .Marland Kitchen... Take 1 tablet by mouth once a day for blood pressure    Aspirin 81 Mg Tbec (Aspirin) .Marland Kitchen... Take one tablet by mouth pt does not take asa everyday(prn)  Her updated medication list for this problem includes:    Cardizem Cd 180 Mg Xr24h-cap (Diltiazem hcl coated beads) .Marland Kitchen... Take one tab by mouth once daily    Cozaar 50 Mg Tabs (Losartan potassium) .Marland Kitchen... Take 1 tablet by mouth once a day for blood pressure    Aspirin 81 Mg Tbec (Aspirin) .Marland Kitchen... Take one tablet by mouth pt does not take asa everyday(prn)  Orders: EKG w/ Interpretation (93000) Treadmill (Treadmill)  Problem # 3:  HYPERLIPIDEMIA (ICD-272.4)  This is managed with Crestor. Her updated medication list for this problem includes:    Crestor 10 Mg Tabs (Rosuvastatin calcium) .Marland Kitchen... Take one tablet by mouth daily.  Her updated medication list for this problem includes:    Crestor 10 Mg Tabs (Rosuvastatin calcium) .Marland Kitchen... Take one tablet by mouth daily.  Patient Instructions: 1)  Your physician has recommended you make the following change in your medication: 1) Increase Cardizem to 240mg  once daily  2)  Your physician has requested that you have an exercise tolerance test in 4 weeks with Dr. Juanda Chance.  For further information please visit [email or web address]  Please also follow instruction sheet, as given. 3)  Call us if you have any recurrence of your symptoms prior to your treadmill test. Prescriptions: CARDIZEM CD 240 MG XR24H-CAP (DILTIAZEM HCL COATED BEADS) take one capsule by mouth once daily  #30 x 6   Entered by:   Sherri Rad, RN, BSN   Authorized by:   Lenoria Farrier, MD, [facility]   Signed by:   Sherri Rad, RN, BSN on 03/03/2010   Method used:   Electronically to        [facility]. [phone]* (retail)       [address]       [place], [place]  [address]       Ph: [phone] or [phone]       Fax: [phone]   RxID:   [id number]

## 2010-11-24 NOTE — Progress Notes (Signed)
Summary: Medication questions (lmtc)  Phone Note Other Incoming   Summary of Call: The pt was here in our office today for a coumadin check. Tiffany-RN from [facility] came to talk to me about the pt. The pt has not started amiodarone as instructed due to her concern about side effects. She is also concerned that she is still on an Aspirin 325mg  once daily now that she has started coumadin. The other concern is what dose of Cozaar is the pt to be on. Dr. Juanda Chance dictated 25mg  once daily in his note, but told me the pt needed 50mg  once daily at her office visit. I explained that the pt should be on Cozaar 50mg  once daily and that she should go ahead and start her amiodarone as prescribed, but I will address this with Dr. Juanda Chance and also talk with him about the pt's ASA dose. I will call the pt back after speaking with Dr. Juanda Chance. Initial call taken by: Sherri Rad, RN, BSN,  February 15, 2010 11:16 AM  Follow-up for Phone Call        I have discussed the pt's medications with Dr. Juanda Chance. The pt may decrease ASA to 81mg  once daily. If the pt is uncomfortable with amiodarone, then Dr. Juanda Chance recommends starting flecainide 50mg  two times a day with a return office visit in 2 weeks. I have left a message for the pt to call. Sherri Rad, RN, BSN  February 15, 2010 5:44 PM  Pt returning call about medication Judie Grieve  February 16, 2010 8:13 AM  Additional Follow-up for Phone Call Additional follow up Details #1::        Called patient back...she is aware to decrease ASA to 81mg  every day. She does not want to start Amiodarone so she will start Flecainide 50mg  two times a day and will come back to see Dr.Mylia Pondexter on 5/11. Script sent to pharmacy. Additional Follow-up by: J REISS RN    New/Updated Medications: ASPIRIN 81 MG TBEC (ASPIRIN) Take one tablet by mouth daily FLECAINIDE ACETATE 50 MG TABS (FLECAINIDE ACETATE) Take one tablet by mouth every 12 hours Prescriptions: FLECAINIDE ACETATE 50 MG TABS  (FLECAINIDE ACETATE) Take one tablet by mouth every 12 hours  #60 x 6   Entered by:   Layne Benton, RN, BSN   Authorized by:   Lenoria Farrier, MD, [facility]   Signed by:   Layne Benton, RN, BSN on 02/16/2010   Method used:   Electronically to        [facility]. [phone]* (retail)       [address]       [place], [place]  [address]       Ph: [phone] or [phone]       Fax: [phone]   RxID:   [id number]

## 2010-11-24 NOTE — Progress Notes (Signed)
Summary: Nuclear Pre-Procedure  Phone Note Outgoing Call   Call placed by: Milana Na, EMT-P,  February 08, 2010 2:39 PM Summary of Call: Left message with information on Myoview Information Sheet (see scanned document for details).      Nuclear Med Background Indications for Stress Test: Evaluation for Ischemia   History: Echo, Myocardial Perfusion Study   Symptoms: Palpitations    Nuclear Pre-Procedure Cardiac Risk Factors: Family History - CAD, Hypertension, LBBB, Lipids Height (in): 65  Nuclear Med Study Referring MD:  B.Juanda Chance

## 2010-11-24 NOTE — Miscellaneous (Signed)
Summary: Patient instructions  Clinical Lists Changes  Observations: Added new observation of PI CARDIO: Your physician recommends that you schedule a follow-up appointment in: 3 months. Your physician recommends that you continue on your current medications as directed. Please refer to the Current Medication list given to you today. (03/31/2010 16:15)      Patient Instructions: 1)  Your physician recommends that you schedule a follow-up appointment in: 3 months. 2)  Your physician recommends that you continue on your current medications as directed. Please refer to the Current Medication list given to you today.

## 2010-11-24 NOTE — Medication Information (Signed)
Summary: Coumadin Clinic  Anticoagulant Therapy  Managed by: Inactive Referring MD: B.Juanda Chance PCP: Dr Marjory Sneddon MD: Juanda Chance MD, Jazel Nimmons Indication 1: Atrial Fibrillation Lab Used: [facility] [place] Site: [address] INR RANGE 2.0-3.0          Comments: Pt being followed by [facility] in [place]  Allergies: 1)  ! * Tussionex  Anticoagulation Management History:      Positive risk factors for bleeding include an age of 72 years or older.  The bleeding index is 'intermediate risk'.  Positive CHADS2 values include History of HTN and Age > 26 years old.  Anticoagulation responsible provider: Juanda Chance MD, Smitty Cords.  Exp: 05/2011.    Anticoagulation Management Assessment/Plan:      The patient's current anticoagulation dose is Coumadin 5 mg tabs: take one tab by mouth once daily as directed.  The target INR is 2.0-3.0.  The next INR is due 04/14/2010.  Anticoagulation instructions were given to patient.  Results were reviewed/authorized by Inactive.         Prior Anticoagulation Instructions: INR 1.8  Continue same dose of 1 tablet every day.

## 2010-11-24 NOTE — Medication Information (Signed)
Summary: rov/eac  Anticoagulant Therapy  Managed by: Weston Brass, PharmD Referring MD: B.Juanda Chance PCP: Dr Marjory Sneddon MD: Juanda Chance MD, Makenna Macaluso Indication 1: Atrial Fibrillation Lab Used: [facility] [place] Site: [address] INR POC 1.8 INR RANGE 2.0-3.0  Dietary changes: no    Health status changes: yes       Details: had some chest pain this AM; had stress test today and seeing Dr. Juanda Chance  Bleeding/hemorrhagic complications: no    Recent/future hospitalizations: no    Any changes in medication regimen? no    Recent/future dental: no  Any missed doses?: no       Is patient compliant with meds? yes       Allergies: 1)  ! * Tussionex  Anticoagulation Management History:      The patient is taking warfarin and comes in today for a routine follow up visit.  Positive risk factors for bleeding include an age of 75 years or older.  The bleeding index is 'intermediate risk'.  Positive CHADS2 values include History of HTN and Age > 75 years old.  Anticoagulation responsible provider: Juanda Chance MD, Smitty Cords.  INR POC: 1.8.  Cuvette Lot#: [id number].  Exp: 05/2011.    Anticoagulation Management Assessment/Plan:      The patient's current anticoagulation dose is Coumadin 5 mg tabs: take one tab by mouth once daily as directed.  The target INR is 2.0-3.0.  The next INR is due 04/14/2010.  Anticoagulation instructions were given to patient.  Results were reviewed/authorized by Weston Brass, PharmD.  She was notified by Weston Brass PharmD.         Prior Anticoagulation Instructions: INR 2.2  Continue taking 1/2 tablet on Thursday and 1 tablet all other days.  Return to clinic in 3 weeks.      Current Anticoagulation Instructions: INR 1.8  Continue same dose of 1 tablet every day.

## 2010-11-24 NOTE — Medication Information (Signed)
Summary: coumadin new eval  Anticoagulant Therapy  Managed by: Bethena Midget, RN, BSN Referring MD: B.Juanda Chance PCP: Dr Marjory Sneddon MD: Excell Seltzer MD, Casimiro Needle Indication 1: Atrial Fibrillation Lab Used: [facility] [place] Site: [address] INR POC 1.1 INR RANGE 2.0-3.0  Dietary changes: no    Health status changes: no    Bleeding/hemorrhagic complications: no    Recent/future hospitalizations: no    Any changes in medication regimen? no    Recent/future dental: no  Any missed doses?: no       Is patient compliant with meds? yes      Comments: New pt education completed.   Allergies (verified): 1)  ! * Tussionex  Anticoagulation Management History:      The patient comes in today for her initial visit for anticoagulation therapy.  Positive risk factors for bleeding include an age of 75 years or older.  The bleeding index is 'intermediate risk'.  Positive CHADS2 values include History of HTN and Age > 64 years old.  Anticoagulation responsible provider: Excell Seltzer MD, Casimiro Needle.  INR POC: 1.1.  Cuvette Lot#: [id number].  Exp: 03/2011.    Anticoagulation Management Assessment/Plan:      The patient's current anticoagulation dose is Coumadin 5 mg tabs: take one tab by mouth once daily as directed.  The next INR is due 02/19/2010.  Anticoagulation instructions were given to patient.  Results were reviewed/authorized by Bethena Midget, RN, BSN.  She was notified by Bethena Midget, RN, BSN.         Current Anticoagulation Instructions: INR 1.1 Continue 5mg s daily. Recheck in 4 days.

## 2010-11-24 NOTE — Assessment & Plan Note (Signed)
Summary: 1 month rov   Visit Type:  Follow-up Primary Provider:  Dr Jeanie Sewer  CC:  pt had a spell last night.  History of Present Illness: The patient is 77 years old and return for management of cardiac arrhythmia. I saw her for palpitations and we did an event monitor which showed an SVT at 150 and questionable atrial fibrillation. We started her on Cardizem and held off on Coumadin until we are more certain she had atrial fibrillation. Since that time her monitor has clearly shown a 2 fibrillation as well as more SVT with rate of 150.  She had an episode last night of rapid palpitations which lasted 30 minutes.  We have evaluated her recently with an echocardiogram which was normal and with a Myoview scan which was normal.  Her other problems include hypertension, hyperlipidemia, carotid bruit, and a positive family history for CAD.  She is [place] score 2  Current Medications (verified): 1)  Aspirin Ec 325 Mg Tbec (Aspirin) .... As Needed 2)  Crestor 10 Mg Tabs (Rosuvastatin Calcium) .... Take One Tablet By Mouth Daily. 3)  Cardizem Cd 180 Mg Xr24h-Cap (Diltiazem Hcl Coated Beads) .... Take One Tab By Mouth Once Daily 4)  Fish Oil 1200mg  .... 1 Tab Once Daily 5)  Calicum With Vit D .... As Needed 6)  Vitamin C .... As Needed 7)  Co Q 10 .... Prn  Allergies (verified): 1)  ! * Tussionex  Past History:  Past Medical History: Reviewed history from 01/14/2010 and no changes required. Current Problems:  HYPERLIPIDEMIA (ICD-272.4) HYPERTENSION (ICD-401.9)  Review of Systems       ROS is negative except as outlined in HPI.   Vital Signs:  Patient profile:   77 year old female Height:      64 inches Weight:      133 pounds BMI:     22.91 Pulse rate:   72 / minute BP sitting:   167 / 82  (left arm) Cuff size:   regular  Vitals Entered By: Burnett Kanaris, CNA (February 10, 2010 11:55 AM)  Physical Exam  Additional Exam:  Gen. Well-nourished, in no distress   Neck: No JVD,  thyroid not enlarged, no carotid bruits Lungs: No tachypnea, clear without rales, rhonchi or wheezes Cardiovascular: Rhythm regular, PMI not displaced,  heart sounds  normal, no murmurs or gallops, no peripheral edema, pulses normal in all 4 extremities. Abdomen: BS normal, abdomen soft and non-tender without masses or organomegaly, no hepatosplenomegaly. MS: No deformities, no cyanosis or clubbing   Neuro:  No focal sns   Skin:  no lesions    Impression & Recommendations:  Problem # 1:  ATRIAL FIBRILLATION (ICD-427.31) She now has clear documentation of atrial fibrillation on her event monitor as well as other episodes of SVT that are regular at rate of about 150 which could be reentrant SVT or atrial flutter. She is quite symptomatic from these episodes. We will plan to start amiodarone 400 mg t.i.d. for a week and then 400 mg daily. We will also start Coumadin with early followup in the Coumadin clinic. Her updated medication list for this problem includes:    Aspirin Ec 325 Mg Tbec (Aspirin) .Marland Kitchen... As needed    Amiodarone Hcl 400 Mg Tabs (Amiodarone hcl) .Marland Kitchen... Take one tablet by mouth twice a day for 1 week, then take one tablet by mouth daily    Coumadin 5 Mg Tabs (Warfarin sodium) .Marland Kitchen... Take one tab by mouth once daily as directed  Orders: [address] Cou[facility]in Clinic Referral (Coumadin clinic)  Problem # 2:  HYPERTENSION (ICD-401.9) Her blood pressure is elevated today. We will add Cozaar 50 mg her current medications. Her updated medication list for this problem includes:    Aspirin Ec 325 Mg Tbec (Aspirin) .Marland Kitchen... As needed    Cardizem Cd 180 Mg Xr24h-cap (Diltiazem hcl coated beads) .Marland Kitchen... Take one tab by mouth once daily    Cozaar 25 Mg Tabs (Losartan potassium) .Marland Kitchen... Take one tablet by mouth daily  Problem # 3:  HYPERLIPIDEMIA (ICD-272.4) This is currently being treated with Crestor. Her updated medication list for this problem includes:    Crestor 10 Mg Tabs (Rosuvastatin  calcium) .Marland Kitchen... Take one tablet by mouth daily.  Patient Instructions: 1)  Your physician recommends that you schedule a follow-up appointment in: 4 weeks. 2)  Your physician has recommended you make the following change in your medication: 1) Start amiodarone 400mg  two times a day x one week, then take 400mg  once a day. 2) Start Cozaar (losartan) 50mg  once daily. 3) Start Coumadin (warfarin) 5 mg once daily on saturday (4/23) evening. 3)  We will make a Coumadin Clinic appointment for monday 02/15/10.

## 2010-11-24 NOTE — Progress Notes (Signed)
----   Converted from flag ---- ---- 02/17/2010 8:32 AM, Sherri Rad, RN, BSN wrote: Vickii Chafe. Ms. Louis decided not to start amiodarone, so Dr. Juanda Chance has started flecainide. Thanks ------------------------------

## 2010-11-24 NOTE — Progress Notes (Signed)
Summary: Needs Rx for Amio, Coumadin, Cozaar 50  Phone Note Call from Patient   Caller: Patient Reason for Call: Refill Medication Action Taken: Rx Called In Summary of Call: Patient seen in office last week.  Was to start Amio, coumadin and Cozaar 50 per notes. [place] never got prescription.  Initial call taken by: Tereso Newcomer PA-C,  February 13, 2010 10:39 AM    New/Updated Medications: COZAAR 50 MG TABS (LOSARTAN POTASSIUM) Take 1 tablet by mouth once a day for blood pressure Prescriptions: COZAAR 50 MG TABS (LOSARTAN POTASSIUM) Take 1 tablet by mouth once a day for blood pressure  #30 x 5   Entered and Authorized by:   Tereso Newcomer PA-C   Signed by:   Tereso Newcomer PA-C on 02/13/2010   Method used:   Electronically to        [facility]. [phone]* (retail)       [address]       [place], [place]  [address]       Ph: [phone] or [phone]       Fax: [phone]   RxID:   [id number] COUMADIN 5 MG TABS (WARFARIN SODIUM) take one tab by mouth once daily as directed  #30 x 5   Entered and Authorized by:   Tereso Newcomer PA-C   Signed by:   Tereso Newcomer PA-C on 02/13/2010   Method used:   Electronically to        [facility] [phone]* (retail)       [address]       [place], [place]  [address]       Ph: [phone] or [phone]       Fax: [phone]   RxID:   [id number] AMIODARONE HCL 400 MG TABS (AMIODARONE HCL) Take one tablet by mouth twice a day for 1 week, then take one tablet by mouth daily  #30 x 5   Entered and Authorized by:   Tereso Newcomer PA-C   Signed by:   Tereso Newcomer PA-C on 02/13/2010   Method used:   Electronically to        [facility] [phone]* (retail)       [address]       [place], [place]  [address]       Ph: [phone] or [phone]       Fax: [phone]   RxID:   [id number]

## 2010-11-25 NOTE — Progress Notes (Signed)
Summary: question about Cozaar    waiting on pt to call back  pfh  Phone Note From Pharmacy Call back at [phone]   Caller: [facility] [phone]* Summary of Call: Pharmacy have questions about  Cozaar Initial call taken by: Judie Grieve,  October 12, 2010 3:55 PM  Follow-up for Phone Call        I attempted to call the pt before calling the pharmacy back to see if she is taking Cozaar daily or is she only taking this 3 or 4 times/ week. There is no answer x 10 rings. I will ask the triage nurse to attempt to call the pt tomorrow. I have called the pharmacy and made them aware we will verify dosing tomorrow and call them back. Follow-up by: Sherri Rad, RN, BSN,  October 12, 2010 6:02 PM  Additional Follow-up for Phone Call Additional follow up Details #1::        Spoke with Mr Pollio - he is unsure of how pt takes her Cozaar and will have the pt call us back with that information  Sander Nephew, RN  8:22am 10/13/2010  per pt - states she is taking cozaar every day Additional Follow-up by: Charolotte Capuchin, RN,  October 13, 2010 3:45 PM    New/Updated Medications: COZAAR 50 MG TABS (LOSARTAN POTASSIUM) one daily

## 2010-11-25 NOTE — Progress Notes (Signed)
Summary: refill  Phone Note Refill Request Message from:  Patient on October 06, 2010 9:08 AM  Refills Requested: Medication #1:  CARDIZEM CD 240 MG XR24H-CAP 1 tab every 3 or 4 days  Medication #2:  Losartan 50 mg Send to [facility] [phone]  Initial call taken by: Judie Grieve,  October 06, 2010 9:10 AM    Prescriptions: COZAAR 50 MG TABS (LOSARTAN POTASSIUM) 3 or 4 times weekly  #30 x 12   Entered by:   Kem Parkinson   Authorized by:   Lenoria Farrier, MD, [facility]   Signed by:   Kem Parkinson on 10/06/2010   Method used:   Electronically to        [facility]. [phone]* (retail)       [address]       [place], [place]  [address]       Ph: [phone] or [phone]       Fax: [phone]   RxID:   [id number] CARDIZEM CD 240 MG XR24H-CAP (DILTIAZEM HCL COATED BEADS) 1 tab every 3 or 4 days  #30 x 12   Entered by:   Kem Parkinson   Authorized by:   Lenoria Farrier, MD, [facility]   Signed by:   Kem Parkinson on 10/06/2010   Method used:   Electronically to        [facility]. [phone]* (retail)       [address]       [place], [place]  [address]       Ph: [phone] or [phone]       Fax: [phone]   RxID:   [id number]

## 2011-02-12 ENCOUNTER — Encounter: Payer: Self-pay | Admitting: Cardiology

## 2011-02-15 ENCOUNTER — Encounter: Payer: Self-pay | Admitting: Cardiology

## 2011-02-15 ENCOUNTER — Ambulatory Visit (INDEPENDENT_AMBULATORY_CARE_PROVIDER_SITE_OTHER): Payer: Medicare Other | Admitting: Cardiology

## 2011-02-15 VITALS — BP 170/80 | HR 61 | Ht 64.0 in | Wt 130.0 lb

## 2011-02-15 DIAGNOSIS — I10 Essential (primary) hypertension: Secondary | ICD-10-CM

## 2011-02-15 DIAGNOSIS — R0989 Other specified symptoms and signs involving the circulatory and respiratory systems: Secondary | ICD-10-CM

## 2011-02-15 DIAGNOSIS — I4891 Unspecified atrial fibrillation: Secondary | ICD-10-CM

## 2011-02-15 MED ORDER — DABIGATRAN ETEXILATE MESYLATE 150 MG PO CAPS: 150.0000 mg | ORAL_CAPSULE | Freq: Two times a day (BID) | ORAL | Status: DC

## 2011-02-15 MED ORDER — LOSARTAN POTASSIUM 50 MG PO TABS: 50.0000 mg | ORAL_TABLET | Freq: Two times a day (BID) | ORAL | Status: DC

## 2011-02-15 NOTE — Assessment & Plan Note (Signed)
Her blood pressure is not controlled. I will increase Cozaar to 50 mg b.i.d.

## 2011-02-15 NOTE — Progress Notes (Signed)
HPI The patient was previously seen by Dr. Juanda Chance.  She has a history of hypertension and paroxysmal atrial fibrillation. She feels this every few months but it doesn't last long. It looks from Dr. Regino Schultze notes highlight  that she has not been consistent with taking Cardizem and she admits to this. She rarely takes her Coumadin. He has clearly outlined risk of stroke. She has not had any presyncope or syncope. She is not describing chest pressure, neck or arm discomfort. She's not having any new shortness of breath, PND or orthopnea. She has had no weight gain or edema.  Allergies  Allergen Reactions  . Tussionex Pennkinetic Er     REACTION: Reaction not known    Current Outpatient Prescriptions  Medication Sig Dispense Refill  . aspirin 81 MG tablet Take 81 mg by mouth. Pat. does not take asa everyday PRN       . Calcium Carbonate-Vitamin D (RA CALCIUM PLUS VITAMIN D) 600-400 MG-UNIT per tablet Take by mouth. As needed        . losartan (COZAAR) 50 MG tablet Take 50 mg by mouth daily.        . Omega-3 Fatty Acids (FISH OIL) 1200 MG CAPS Take 1 capsule by mouth daily.        Marland Kitchen diltiazem (CARDIZEM CD) 240 MG 24 hr capsule Take by mouth. As needed      . warfarin (COUMADIN) 5 MG tablet Take 5 mg by mouth daily. 4mg  tabs pt takes about 1 mg prn        Past Medical History  Diagnosis Date  . Hyperlipidemia   . Hypertension   . Atrial fibrillation   . Uterine cancer   . Hx of colonic polyps     Past Surgical History  Procedure Date  . Partial colectomy   . Abdominal hysterectomy     ROS:  As stated in the HPI and negative for all other systems.  PHYSICAL EXAM BP 170/80  Pulse 61  Ht 5\' 4"  (1.626 m)  Wt 130 lb (58.968 kg)  BMI 22.31 kg/m2 GENERAL:  Well appearing HEENT:  Pupils equal round and reactive, fundi not visualized, oral mucosa unremarkable NECK:  No jugular venous distention, waveform within normal limits, carotid upstroke brisk and symmetric, no bruits, no  thyromegaly LYMPHATICS:  No cervical, inguinal adenopathy LUNGS:  Clear to auscultation bilaterally BACK:  No CVA tenderness CHEST:  Unremarkable HEART:  PMI not displaced or sustained,S1 and S2 within normal limits, no S3, no S4, no clicks, no rubs, no murmurs ABD:  Flat, positive bowel sounds normal in frequency in pitch, no bruits, no rebound, no guarding, no midline pulsatile mass, no hepatomegaly, no splenomegaly EXT:  2 plus pulses throughout, no edema, no cyanosis no clubbing SKIN:  No rashes no nodules NEURO:  Cranial nerves II through XII grossly intact, motor grossly intact throughout PSYCH:  Cognitively intact, oriented to person place and time  EKG:  Sinus rhythm, rate 63, axis within normal limits, intervals within normal limits, mild interventricular conduction delay  ASSESSMENT AND PLAN

## 2011-02-15 NOTE — Assessment & Plan Note (Signed)
I will need to review her chart to see if I can find previous Dopplers and follow up appropriately.

## 2011-02-15 NOTE — Assessment & Plan Note (Signed)
We have discussed at length the risk of stroke. She agrees to try Pradaxa.Marland Kitchen She will get appropriate blood work followup. Since she is not taking the Cardizem I will stay off of this for now and treat her blood pressure is below.

## 2011-02-15 NOTE — Patient Instructions (Signed)
Stop Cardiazem Stop Warfarin Start Pradaxa 150 mg one twice a day Increase Losartan to 50 mg one twice a day Have lab work in one week See Dr Antoine Poche back in 6 months

## 2011-02-18 ENCOUNTER — Telehealth: Payer: Self-pay | Admitting: Cardiology

## 2011-02-18 NOTE — Telephone Encounter (Signed)
Pt states she does not want to take Pradaxa at this time.  She will continue Coumadin and have it followed at Dr Robyne Peers office.

## 2011-02-18 NOTE — Telephone Encounter (Signed)
Pt has question re her meds. °

## 2011-02-23 ENCOUNTER — Other Ambulatory Visit: Payer: PRIVATE HEALTH INSURANCE | Admitting: *Deleted

## 2011-03-08 NOTE — Assessment & Plan Note (Signed)
[facility]                            CARDIOLOGY OFFICE NOTE   MARNESHA, Sara Griffin                     MRN:          [id number]  DATE:04/26/2007                            DOB:          [birth year]    REFERRING PHYSICIAN:  Gwendlyn Deutscher II, M.D.   Sara Griffin is 77 years old, and returns for followup after her evaluation  for left bundle branch block and symptoms of fatigue.  She has a history  of hypertension, and a strong family history for cardiovascular disease,  and we felt she had a high-risk profile, and thought her left bundle  branch block could be an indication of ischemic heart disease or other  organic heart disease.  We evaluated her with an adenosine Myoview scan,  which showed no evidence of ischemia, and showed good left ventricular  function.  We did an echocardiogram, which showed normal left  ventricular function with ejection fraction of 60%, and no major  valvular abnormalities.   In retrospect, she thinks her symptoms of fatigue have been very  gradual, and she has attributed these to her age.   PAST MEDICAL HISTORY:  Significant for:  1. Hypertension.  2. Previous partial colectomy.  3. Hysterectomy for endometrial cancer.   EXAMINATION:  The blood pressure was 170/90, and her pulse was 68 (she  indicated she did not take her medications this morning).  There was no venous distention.  The carotid pulses were full without  bruits.  CHEST:  Clear without rales or rhonchi.  HEART:  Rhythm was regular.  I could hear no murmurs or gallops.  ABDOMEN:  Soft with normal bowel sounds.  Peripheral pulses were full.  There was no peripheral edema.   IMPRESSION:  1. Left bundle branch block.  2. No evidence of organic heart disease.  3. Hypertension.  4. Hyperlipidemia.  5. Positive family history for coronary heart disease.   IMPRESSION:  Sara Griffin has left bundle branch block, but no evidence of  organic heart disease or  ischemic heart disease.  We reassured her  today.  I encouraged her to continue her  treatment for hypertension and hyperlipidemia since she still is at  moderately high risk for cardiac events in the future.     Bruce Elvera Lennox Juanda Chance, MD, [facility]  Electronically Signed    BRB/MedQ  DD: 04/26/2007  DT: 04/26/2007  Job #: [id number]   cc:   Durenda Hurt, M.D.

## 2011-03-08 NOTE — Assessment & Plan Note (Signed)
[facility]                            CARDIOLOGY OFFICE NOTE   AURIA, MCKINLAY                     MRN:          [id number]  DATE:03/27/2007                            DOB:          12-15-1929    PRIMARY CARE PHYSICIAN:  Gwendlyn Deutscher, M.D.   REASON FOR REFERRAL:  Abnormal electrocardiogram.   CLINICAL HISTORY:  Sara Griffin is 77 years old and had been evaluated for  [place] in 2000 with an exercise nuclear scan which showed no evidence of  ischemia.  She recently was seen by Dr. Jeanie Sewer for an examination and  was found to have a new left bundle-branch block on her  electrocardiogram.  For this reason, he arranged for her referral here.   She says she has had no chest pain or palpitations but she does say she  gets short of breath if she has moderate exertion.  She had attributed  this to age.   Her risk factors for vascular disease include hypertension,  hyperlipidemia, and a strong family history as outlined below.  Dr.  Jeanie Sewer started her on enalapril 5 mg b.i.d. but she has not started  this medicine yet.   PAST MEDICAL HISTORY:  Significant for partial colectomy.  She also had  a total hysterectomy.  The hysterectomy was for endometrial cancer.  The  colectomy was for polyps.   CURRENT MEDICATIONS:  She is currently not on any medications.   SOCIAL HISTORY:  She is married and has five children.  She had one son  who played football at [employer] who died of a heart attack at a young  age.   FAMILY HISTORY:  Positive for cardiovascular disease.  She has a father  who died at age 55 of a heart attack and a son who died at age 8 of a  heart attack.  Her mother lived to be 82.   REVIEW OF SYSTEMS:  Positive for symptoms of fatigue.   EXAMINATION TODAY:  VITAL SIGNS:  The blood pressure was 190/87, the  pulse 66 and regular.  NECK:  There was no venous distension.  The carotid pulses were full  without bruits.  CHEST:  Clear  without rales or rhonchi.  CARDIAC:  Rhythm was regular.  I hear no murmurs or gallops.  ABDOMEN:  Soft.  The bowel sounds were normal.  There was no  hepatosplenomegaly and no pulsatile masses.  Peripheral pulses were  full.  There was no peripheral edema.  There were slight varicosities of  the left lower extremity.  MUSCULOSKELETAL:  Showed no deformities.  SKIN:  Warm and dry.  NEUROLOGIC:  Showed no focal neurological signs.   IMPRESSION:  1. New left bundle-branch block.  2. Symptoms of fatigue and dyspnea with exertion.  3. Hypertension, currently not taking medications.  4. Hyperlipidemia.  5. Positive family history of coronary heart disease.   RECOMMENDATIONS:  Ms. Sabo has new left bundle-branch block and has a  strong risk profile for vascular disease.  I think we should evaluate  her further with an exercise rest/stress Myoview scan and with an  echocardiogram.  I recommend that she go ahead and start the enalapril 5  mg a day that Dr. Jeanie Sewer prescribed and I have also recommended that  she go on simvastatin 40 mg a day.  She is somewhat reluctant to do this  but I think is agreeable.  We will plan to see her back in 4 weeks to  review her tests with her and decide if any further treatment is needed.     Bruce Elvera Lennox Juanda Chance, MD, [facility]  Electronically Signed    BRB/MedQ  DD: 03/27/2007  DT: 03/27/2007  Job #: [id number]   cc:   Durenda Hurt, M.D.

## 2012-01-11 ENCOUNTER — Ambulatory Visit (INDEPENDENT_AMBULATORY_CARE_PROVIDER_SITE_OTHER): Payer: Medicare Other | Admitting: Cardiology

## 2012-01-11 ENCOUNTER — Encounter: Payer: Self-pay | Admitting: Cardiology

## 2012-01-11 VITALS — BP 130/85 | HR 58 | Ht 65.0 in | Wt 132.0 lb

## 2012-01-11 DIAGNOSIS — I10 Essential (primary) hypertension: Secondary | ICD-10-CM

## 2012-01-11 DIAGNOSIS — I4891 Unspecified atrial fibrillation: Secondary | ICD-10-CM

## 2012-01-11 MED ORDER — WARFARIN SODIUM 5 MG PO TABS: 5.0000 mg | ORAL_TABLET | Freq: Every day | ORAL | Status: DC

## 2012-01-11 NOTE — Assessment & Plan Note (Signed)
The blood pressure is at target. No change in medications is indicated. We will continue with therapeutic lifestyle changes (TLC).  

## 2012-01-11 NOTE — Assessment & Plan Note (Signed)
She had 0 - 39% bilateral stenosis in 2011.  No further study is indicated at this point.

## 2012-01-11 NOTE — Progress Notes (Signed)
   HPI The patient was previously seen by Dr. Juanda Chance.  She has a history of hypertension and paroxysmal atrial fibrillation. At the last visit we talked about switching to Pradaxa because she was not taking her warfarin consistently. However, she now says she is taking this in fact it's been high recently. She called [place] back and said she couldn't wear Pradaxa.  However she may not have investigated this thoroughly. He continues to occasional palpitations but is not having any presyncope or syncope. Her symptoms lasted for only an hour at a time or maybe several minutes. She's not having any chest pressure or shortness of breath.   Allergies  Allergen Reactions  . Tussionex Pennkinetic Er     REACTION: Reaction not known    Current Outpatient Prescriptions  Medication Sig Dispense Refill  . aspirin 81 MG tablet Take 81 mg by mouth. Pat. does not take asa everyday PRN       . Calcium Carbonate-Vitamin D (RA CALCIUM PLUS VITAMIN D) 600-400 MG-UNIT per tablet Take by mouth. As needed        . dabigatran (PRADAXA) 150 MG CAPS Take 1 capsule (150 mg total) by mouth every 12 (twelve) hours.  60 capsule  6  . losartan (COZAAR) 50 MG tablet Take 1 tablet (50 mg total) by mouth 2 (two) times daily.  60 tablet  11  . Omega-3 Fatty Acids (FISH OIL) 1200 MG CAPS Take 1 capsule by mouth daily.          Past Medical History  Diagnosis Date  . Hyperlipidemia   . Hypertension   . Atrial fibrillation   . Uterine cancer   . Hx of colonic polyps     Past Surgical History  Procedure Date  . Partial colectomy   . Abdominal hysterectomy     ROS:  As stated in the HPI and negative for all other systems.  PHYSICAL EXAM There were no vitals taken for this visit. GENERAL:  Well appearing NECK:  No jugular venous distention, waveform within normal limits, carotid upstroke brisk and symmetric, no bruits, no thyromegaly LYMPHATICS:  No cervical, inguinal adenopathy LUNGS:  Clear to auscultation  bilaterally BACK:  No CVA tenderness CHEST:  Unremarkable HEART:  PMI not displaced or sustained,S1 and S2 within normal limits, no S3, no S4, no clicks, no rubs, no murmurs ABD:  Flat, positive bowel sounds normal in frequency in pitch, no bruits, no rebound, no guarding, no midline pulsatile mass, no hepatomegaly, no splenomegaly EXT:  2 plus pulses throughout, no edema, no cyanosis no clubbing  EKG:  Sinus rhythm, rate 58, axis within normal limits, intervals within normal limits, mild interventricular conduction delay.  No change from previous. 01/11/2012  ASSESSMENT AND PLAN

## 2012-01-11 NOTE — Patient Instructions (Signed)
The current medical regimen is effective;  continue present plan and medications.  Follow up in 1 year with Dr Hochrein.  You will receive a letter in the mail 2 months before you are due.  Please call us when you receive this letter to schedule your follow up appointment.  

## 2012-01-11 NOTE — Assessment & Plan Note (Signed)
If she is having trouble taking warfarin she clearly might benefit from Pradaxa.  We again discussed this and she will look into it further and call us back if she wants to switch. At least she understands now the importance of anticoagulation and she will continue.

## 2012-03-05 ENCOUNTER — Encounter: Payer: Self-pay | Admitting: Cardiology

## 2012-04-05 ENCOUNTER — Other Ambulatory Visit: Payer: Self-pay | Admitting: Cardiology

## 2012-04-05 NOTE — Telephone Encounter (Signed)
..   Requested Prescriptions   Pending Prescriptions Disp Refills  . losartan (COZAAR) 50 MG tablet [Pharmacy Med Name: LOSARTAN 50MG  TABLETS] 60 tablet 10    Sig: TAKE 1 TABLET BY MOUTH TWICE DAILY

## 2013-03-11 ENCOUNTER — Encounter: Payer: Self-pay | Admitting: Cardiology

## 2013-03-11 ENCOUNTER — Ambulatory Visit (INDEPENDENT_AMBULATORY_CARE_PROVIDER_SITE_OTHER): Payer: Medicare Other | Admitting: Cardiology

## 2013-03-11 VITALS — BP 135/94 | HR 60 | Ht 61.0 in | Wt 144.8 lb

## 2013-03-11 DIAGNOSIS — I4891 Unspecified atrial fibrillation: Secondary | ICD-10-CM

## 2013-03-11 DIAGNOSIS — E785 Hyperlipidemia, unspecified: Secondary | ICD-10-CM

## 2013-03-11 DIAGNOSIS — I498 Other specified cardiac arrhythmias: Secondary | ICD-10-CM

## 2013-03-11 DIAGNOSIS — R002 Palpitations: Secondary | ICD-10-CM

## 2013-03-11 DIAGNOSIS — R0989 Other specified symptoms and signs involving the circulatory and respiratory systems: Secondary | ICD-10-CM

## 2013-03-11 NOTE — Patient Instructions (Addendum)

## 2013-03-11 NOTE — Progress Notes (Signed)
   HPI The patient is seen for follow up of hypertension and paroxysmal atrial fibrillation. He continues to occasional palpitations but is not having any presyncope or syncope. Her symptoms last maybe several minutes. She's not having any chest pressure or shortness of breath. The patient denies any new symptoms such as neck or arm discomfort. There has been no new shortness of breath, PND or orthopnea.  She has had some recent issues with hematuria but this is now apparently microscopic and not bleeding to anemia. She's had a thorough workup.  Allergies  Allergen Reactions  . Hydrocod Polst-Cpm Polst Er     REACTION: Reaction not known    Current Outpatient Prescriptions  Medication Sig Dispense Refill  . aspirin 81 MG tablet Take 81 mg by mouth. Pat. does not take asa everyday PRN       . COD LIVER OIL PO Take by mouth.      . losartan (COZAAR) 50 MG tablet TAKE 1 TABLET BY MOUTH TWICE DAILY  60 tablet  10  . warfarin (COUMADIN) 5 MG tablet Take 5 mg by mouth daily.      . [DISCONTINUED] dabigatran (PRADAXA) 150 MG CAPS Take 1 capsule (150 mg total) by mouth every 12 (twelve) hours.  60 capsule  6   No current facility-administered medications for this visit.    Past Medical History  Diagnosis Date  . Hyperlipidemia   . Hypertension   . Atrial fibrillation   . Uterine cancer   . Hx of colonic polyps     Past Surgical History  Procedure Laterality Date  . Partial colectomy    . Abdominal hysterectomy      ROS:  As stated in the HPI and negative for all other systems.  PHYSICAL EXAM BP 135/94  Pulse 60  Ht 5\' 1"  (1.549 m)  Wt 144 lb 12.8 oz (65.681 kg)  BMI 27.37 kg/m2 GENERAL:  Well appearing NECK:  No jugular venous distention, waveform within normal limits, carotid upstroke brisk and symmetric, right bruits, no thyromegaly LYMPHATICS:  No cervical, inguinal adenopathy LUNGS:  Clear to auscultation bilaterally BACK:  No CVA tenderness CHEST:  Unremarkable HEART:   PMI not displaced or sustained,S1 and S2 within normal limits, no S3, no S4, no clicks, no rubs, no murmurs ABD:  Flat, positive bowel sounds normal in frequency in pitch, no bruits, no rebound, no guarding, no midline pulsatile mass, no hepatomegaly, no splenomegaly EXT:  2 plus pulses throughout, no edema, no cyanosis no clubbing  EKG:  Sinus rhythm, rate 60, axis within normal limits, intervals within normal limits, mild interventricular conduction delay.  No change from previous. 03/11/2013  ASSESSMENT AND PLAN  ATRIAL FIBRILLATION:   She has symptomatic paroxysms of this.  She should continue anticoagulation unless she has worsening problems with hematuria.  BRUIT:  I will order a carotid Doppler  HTN:  The blood pressure is at target. No change in medications is indicated. We will continue with therapeutic lifestyle changes (TLC).

## 2013-03-13 ENCOUNTER — Telehealth: Payer: Self-pay | Admitting: Cardiology

## 2013-03-13 NOTE — Telephone Encounter (Signed)
New problem    Patient on coumadin .   Pending procedure -biopsy  bladder .   need to come off coumadin 5 days prior .

## 2013-03-14 NOTE — Telephone Encounter (Signed)
She can hold the warfarin as needed for her procedure.  No need for bridging.  (Please update the med list.  She lists ASA, Pradaxa and warfarin.  She should be on warfarin only.)

## 2013-03-15 NOTE — Telephone Encounter (Signed)
Called and spoke with Stanton Kidney who is aware of the instructions OK to hold warfarin as needed for the procedure.  Faxed information to [phone] as requested.  Medication list updated as requested.

## 2013-03-22 ENCOUNTER — Encounter (INDEPENDENT_AMBULATORY_CARE_PROVIDER_SITE_OTHER): Payer: Medicare Other

## 2013-03-22 DIAGNOSIS — R0989 Other specified symptoms and signs involving the circulatory and respiratory systems: Secondary | ICD-10-CM

## 2013-03-22 DIAGNOSIS — I6529 Occlusion and stenosis of unspecified carotid artery: Secondary | ICD-10-CM

## 2013-05-20 ENCOUNTER — Other Ambulatory Visit: Payer: Self-pay | Admitting: *Deleted

## 2013-05-20 MED ORDER — LOSARTAN POTASSIUM 50 MG PO TABS: ORAL_TABLET | ORAL | Status: DC

## 2014-03-03 ENCOUNTER — Ambulatory Visit (INDEPENDENT_AMBULATORY_CARE_PROVIDER_SITE_OTHER): Payer: Medicare Other | Admitting: Cardiology

## 2014-03-03 ENCOUNTER — Encounter: Payer: Self-pay | Admitting: Cardiology

## 2014-03-03 VITALS — BP 146/78 | HR 55 | Ht 61.0 in | Wt 131.0 lb

## 2014-03-03 DIAGNOSIS — I4891 Unspecified atrial fibrillation: Secondary | ICD-10-CM

## 2014-03-03 DIAGNOSIS — R0989 Other specified symptoms and signs involving the circulatory and respiratory systems: Secondary | ICD-10-CM

## 2014-03-03 DIAGNOSIS — Z7901 Long term (current) use of anticoagulants: Secondary | ICD-10-CM

## 2014-03-03 LAB — PROTIME-INR
INR: 3.1 ratio — AB (ref 0.8–1.0)
Prothrombin Time: 33.5 s — ABNORMAL HIGH (ref 9.6–13.1)

## 2014-03-03 NOTE — Patient Instructions (Signed)
Your physician recommends that you continue on your current medications as directed. Please refer to the Current Medication list given to you today.  Your physician wants you to follow-up in: 1 year with Dr. Percival Spanish at [facility]. You will receive a reminder letter in the mail two months in advance. If you don't receive a letter, please call our office to schedule the follow-up appointment.   Dr. Percival Spanish would like for you to get PT/INR checked here in our office today.   Your physician has requested that you have a carotid duplex MAY of 2016. This test is an ultrasound of the carotid arteries in your neck. It looks at blood flow through these arteries that supply the brain with blood. Allow one hour for this exam. There are no restrictions or special instructions.

## 2014-03-03 NOTE — Progress Notes (Signed)
HPI The patient is seen for follow up of hypertension and paroxysmal atrial fibrillation. He continues to occasional palpitations at times but is not having any presyncope or syncope. Her symptoms last maybe several minutes. She's not having any chest pressure or shortness of breath. The patient denies any new symptoms such as neck or arm discomfort. There has been no new shortness of breath, PND or orthopnea.  She does have some increasing fatigue.  She unfortunately reports she's not been getting her warfarin check by primary provider as she was supposed to. She says she hasn't had it checked since September. She denies any bleeding issues. She had hematuria last year but this cleared up. She does have some mild bruising.  Allergies  Allergen Reactions  . Hydrocod Polst-Cpm Polst Er     REACTION: Reaction not known    Current Outpatient Prescriptions  Medication Sig Dispense Refill  . aspirin 81 MG tablet Take 81 mg by mouth daily. Taking every other day      . COD LIVER OIL PO Take by mouth.      . losartan (COZAAR) 50 MG tablet TAKE 1 TABLET BY MOUTH TWICE DAILY  60 tablet  10  . warfarin (COUMADIN) 5 MG tablet Take 5 mg by mouth daily.      Marland Kitchen warfarin (COUMADIN) 5 MG tablet Take 1 tablet (5 mg total) by mouth daily.      . [DISCONTINUED] dabigatran (PRADAXA) 150 MG CAPS Take 1 capsule (150 mg total) by mouth every 12 (twelve) hours.  60 capsule  6   No current facility-administered medications for this visit.    Past Medical History  Diagnosis Date  . Hyperlipidemia   . Hypertension   . Atrial fibrillation   . Uterine cancer   . Hx of colonic polyps     Past Surgical History  Procedure Laterality Date  . Partial colectomy    . Abdominal hysterectomy      ROS:  As stated in the HPI and negative for all other systems.  PHYSICAL EXAM BP 146/78  Pulse 55  Ht 5\' 1"  (1.549 m)  Wt 131 lb (59.421 kg)  BMI 24.76 kg/m2 GENERAL:  Well appearing NECK:  No jugular venous  distention, waveform within normal limits, carotid upstroke brisk and symmetric, right bruits, no thyromegaly LYMPHATICS:  No cervical, inguinal adenopathy LUNGS:  Clear to auscultation bilaterally BACK:  No CVA tenderness CHEST:  Unremarkable HEART:  PMI not displaced or sustained,S1 and S2 within normal limits, no S3, no S4, no clicks, no rubs, no murmurs ABD:  Flat, positive bowel sounds normal in frequency in pitch, no bruits, no rebound, no guarding, no midline pulsatile mass, no hepatomegaly, no splenomegaly EXT:  2 plus pulses throughout, no edema, no cyanosis no clubbing  EKG:  Sinus rhythm, rate 55, axis within normal limits, intervals within normal limits, mild interventricular conduction delay.  No change from previous. 03/03/2014  ASSESSMENT AND PLAN  ATRIAL FIBRILLATION:   She has symptomatic paroxysms of this.  She refuses to take a NOAC.  We discussed dangerous it is that she is not getting of warfarin checked and she says she will do this routinely. If she does not she will need to stop warfarin.  This is followed by [name], MD  BRUIT:  She had mild plaque last year and this can be repeated next year.   HTN:  The blood pressure is at target. No change in medications is indicated. We will continue with therapeutic  lifestyle changes (TLC).

## 2014-07-16 ENCOUNTER — Other Ambulatory Visit: Payer: Self-pay

## 2014-07-16 MED ORDER — LOSARTAN POTASSIUM 50 MG PO TABS: ORAL_TABLET | ORAL | Status: DC

## 2015-03-02 ENCOUNTER — Encounter: Payer: Self-pay | Admitting: Cardiology

## 2015-03-02 ENCOUNTER — Ambulatory Visit (INDEPENDENT_AMBULATORY_CARE_PROVIDER_SITE_OTHER): Payer: Medicare Other | Admitting: Cardiology

## 2015-03-02 VITALS — BP 162/80 | HR 58 | Ht 64.0 in | Wt 130.0 lb

## 2015-03-02 DIAGNOSIS — I4819 Other persistent atrial fibrillation: Secondary | ICD-10-CM

## 2015-03-02 DIAGNOSIS — I481 Persistent atrial fibrillation: Secondary | ICD-10-CM

## 2015-03-02 NOTE — Progress Notes (Signed)
HPI The patient is seen for follow up of hypertension and paroxysmal atrial fibrillation. He continues to occasional palpitations at times but is not having any presyncope or syncope. She's not having any chest pressure or shortness of breath. The patient denies any new symptoms such as neck or arm discomfort. There has been no new shortness of breath, PND or orthopnea.  She does have some increasing fatigue.  She denies any bleeding issues.  She does her chores of daily living without significant limitations.    Allergies  Allergen Reactions  . Hydrocod Polst-Cpm Polst Er     REACTION: Reaction not known    Current Outpatient Prescriptions  Medication Sig Dispense Refill  . aspirin 81 MG tablet Take 81 mg by mouth daily. Taking every other day    . losartan (COZAAR) 50 MG tablet TAKE 1 TABLET BY MOUTH TWICE DAILY 60 tablet 10  . warfarin (COUMADIN) 5 MG tablet Take 5 mg by mouth daily.    . [DISCONTINUED] dabigatran (PRADAXA) 150 MG CAPS Take 1 capsule (150 mg total) by mouth every 12 (twelve) hours. 60 capsule 6   No current facility-administered medications for this visit.    Past Medical History  Diagnosis Date  . Hyperlipidemia   . Hypertension   . Atrial fibrillation   . Uterine cancer   . Hx of colonic polyps     Past Surgical History  Procedure Laterality Date  . Partial colectomy    . Abdominal hysterectomy      ROS:  As stated in the HPI and negative for all other systems.  PHYSICAL EXAM BP 162/80 mmHg  Pulse 58  Ht 5\' 4"  (1.626 m)  Wt 130 lb (58.968 kg)  BMI 22.30 kg/m2 GENERAL:  Well appearing NECK:  No jugular venous distention, waveform within normal limits, carotid upstroke brisk and symmetric, right bruits, no thyromegaly LYMPHATICS:  No cervical, inguinal adenopathy LUNGS:  Clear to auscultation bilaterally BACK:  No CVA tenderness CHEST:  Unremarkable HEART:  PMI not displaced or sustained,S1 and S2 within normal limits, no S3, no S4, no clicks,  no rubs, no murmurs ABD:  Flat, positive bowel sounds normal in frequency in pitch, no bruits, no rebound, no guarding, no midline pulsatile mass, no hepatomegaly, no splenomegaly EXT:  2 plus pulses throughout, no edema, no cyanosis no clubbing  EKG:  Sinus rhythm, rate 58, axis within normal limits, intervals within normal limits, mild interventricular conduction delay. PACs.   No change from previous. 03/02/2015  ASSESSMENT AND PLAN  ATRIAL FIBRILLATION:   She has symptomatic paroxysms of this.  She refuses to take a NOAC.  She was to be having her warfarin checked by   [name],[name], MD.   However, we called his office today and she has not been compliant with Coumadin checked. We have discussed this last year.  Sara Griffin has a CHA2DS2 - VASc score of 4 with a risk of stroke of 4%.  We checked her INR today. The level was 1.5.  I told her I would need to take her off work if she missed even 1 more warfarin check at Dr. Arna Griffin office.  I will ask him to stop the warfarin if that happens and she understands the risk of this. We discussed at length the risk of bleeding or subtherapeutic levels. She promises this year to do better.  BRUIT:  She had mild plaque last year and this can be repeated next year when she comes back in  one year.   HTN:  The blood pressure is at target. No change in medications is indicated. We will continue with therapeutic lifestyle changes (TLC).

## 2015-03-02 NOTE — Patient Instructions (Signed)
Your physician recommends that you schedule a follow-up appointment in: one year with Dr. Hochrein  

## 2015-06-03 ENCOUNTER — Other Ambulatory Visit: Payer: Self-pay | Admitting: Cardiology

## 2016-01-12 DIAGNOSIS — Z7901 Long term (current) use of anticoagulants: Secondary | ICD-10-CM | POA: Insufficient documentation

## 2016-02-29 ENCOUNTER — Encounter: Payer: Self-pay | Admitting: *Deleted

## 2016-03-02 ENCOUNTER — Ambulatory Visit (INDEPENDENT_AMBULATORY_CARE_PROVIDER_SITE_OTHER): Payer: Medicare Other | Admitting: Cardiology

## 2016-03-02 ENCOUNTER — Other Ambulatory Visit: Payer: Self-pay

## 2016-03-02 VITALS — Ht 64.0 in | Wt 130.0 lb

## 2016-03-02 DIAGNOSIS — I480 Paroxysmal atrial fibrillation: Secondary | ICD-10-CM | POA: Diagnosis not present

## 2016-03-02 DIAGNOSIS — Z79899 Other long term (current) drug therapy: Secondary | ICD-10-CM

## 2016-03-02 DIAGNOSIS — R072 Precordial pain: Secondary | ICD-10-CM | POA: Diagnosis not present

## 2016-03-02 DIAGNOSIS — R079 Chest pain, unspecified: Secondary | ICD-10-CM | POA: Insufficient documentation

## 2016-03-02 DIAGNOSIS — R0609 Other forms of dyspnea: Secondary | ICD-10-CM | POA: Diagnosis not present

## 2016-03-02 DIAGNOSIS — Z7901 Long term (current) use of anticoagulants: Secondary | ICD-10-CM | POA: Diagnosis not present

## 2016-03-02 DIAGNOSIS — R0602 Shortness of breath: Secondary | ICD-10-CM | POA: Insufficient documentation

## 2016-03-02 LAB — CBC WITH DIFFERENTIAL/PLATELET
Basophils Absolute: 0 cells/uL (ref 0–200)
Basophils Relative: 0 %
Eosinophils Absolute: 128 cells/uL (ref 15–500)
Eosinophils Relative: 2 %
HCT: 47.1 % — ABNORMAL HIGH (ref 35.0–45.0)
Hemoglobin: 16 g/dL — ABNORMAL HIGH (ref 11.7–15.5)
Lymphocytes Relative: 29 %
Lymphs Abs: 1856 cells/uL (ref 850–3900)
MCH: 32.2 pg (ref 27.0–33.0)
MCHC: 34 g/dL (ref 32.0–36.0)
MCV: 94.8 fL (ref 80.0–100.0)
MPV: 9.8 fL (ref 7.5–12.5)
Monocytes Absolute: 384 cells/uL (ref 200–950)
Monocytes Relative: 6 %
Neutro Abs: 4032 cells/uL (ref 1500–7800)
Neutrophils Relative %: 63 %
Platelets: 162 10*3/uL (ref 140–400)
RBC: 4.97 MIL/uL (ref 3.80–5.10)
RDW: 14 % (ref 11.0–15.0)
WBC: 6.4 10*3/uL (ref 3.8–10.8)

## 2016-03-02 LAB — COMPREHENSIVE METABOLIC PANEL
ALT: 23 U/L (ref 6–29)
AST: 25 U/L (ref 10–35)
Albumin: 4.3 g/dL (ref 3.6–5.1)
Alkaline Phosphatase: 79 U/L (ref 33–130)
BUN: 20 mg/dL (ref 7–25)
CO2: 25 mmol/L (ref 20–31)
Calcium: 9.3 mg/dL (ref 8.6–10.4)
Chloride: 103 mmol/L (ref 98–110)
Creat: 0.91 mg/dL — ABNORMAL HIGH (ref 0.60–0.88)
Glucose, Bld: 82 mg/dL (ref 65–99)
Potassium: 5 mmol/L (ref 3.5–5.3)
Sodium: 139 mmol/L (ref 135–146)
Total Bilirubin: 1.1 mg/dL (ref 0.2–1.2)
Total Protein: 6.5 g/dL (ref 6.1–8.1)

## 2016-03-02 LAB — PROTIME-INR
INR: 2.04 — ABNORMAL HIGH (ref <1.50)
Prothrombin Time: 23.3 seconds — ABNORMAL HIGH (ref 11.6–15.2)

## 2016-03-02 MED ORDER — DILTIAZEM HCL 60 MG PO TABS: 60.0000 mg | ORAL_TABLET | Freq: Every day | ORAL | Status: DC | PRN

## 2016-03-02 NOTE — Progress Notes (Signed)
    HPI  82 year old female, previously followed by Dr Percival Spanish, h/o PAF and hypertension. She gets episodes of a-fin 1-2 /months, she is not interested in ablation. She states that she can live with it. She takes PRN diltiazem for it. No presyncope or syncope, but DOE and chest tightness with it. She is very active, completely independent, she has noticed worsening worsening DOE in the last few months. No LE edema, orthopnea or PND.    Allergies  Allergen Reactions  . Hydrocod Polst-Cpm Polst Er     REACTION: Reaction not known    Current Outpatient Prescriptions  Medication Sig Dispense Refill  . aspirin 81 MG tablet Take 81 mg by mouth as needed. Taking as needed when heart feels funny    . losartan (COZAAR) 50 MG tablet TAKE 1 TABLET BY MOUTH TWICE DAILY 60 tablet 6  . warfarin (COUMADIN) 5 MG tablet Take 5 mg by mouth daily.    Marland Kitchen diltiazem (CARDIZEM) 60 MG tablet Take 1 tablet (60 mg total) by mouth daily as needed (FOR PARAXYMAL ATRIAL FIBRILLATION). 90 tablet 3  . [DISCONTINUED] dabigatran (PRADAXA) 150 MG CAPS Take 1 capsule (150 mg total) by mouth every 12 (twelve) hours. 60 capsule 6   No current facility-administered medications for this visit.    Past Medical History  Diagnosis Date  . Hyperlipidemia   . Hypertension   . Atrial fibrillation ([place])   . Uterine cancer ([place])   . Hx of colonic polyps     Past Surgical History  Procedure Laterality Date  . Partial colectomy    . Abdominal hysterectomy      ROS:  As stated in the HPI and negative for all other systems.  PHYSICAL EXAM BP 140/90 mmHg, HR 60 Ht 5\' 4"  (1.626 m)  Wt 130 lb (58.968 kg)  BMI 22.30 kg/m2 GENERAL:  Well appearing NECK:  No jugular venous distention, waveform within normal limits, carotid upstroke brisk and symmetric, right bruits, no thyromegaly LYMPHATICS:  No cervical, inguinal adenopathy LUNGS:  Clear to auscultation bilaterally BACK:  No CVA tenderness CHEST:  Unremarkable HEART:   PMI not displaced or sustained,S1 and S2 within normal limits, no S3, no S4, no clicks, no rubs, no murmurs ABD:  Flat, positive bowel sounds normal in frequency in pitch, no bruits, no rebound, no guarding, no midline pulsatile mass, no hepatomegaly, no splenomegaly EXT:  2 plus pulses throughout, no edema, no cyanosis no clubbing  EKG:  Performed today 03/02/2016 showed normal sinus rhythm old anterior infarct age undetermined and negative T waves in inferolateral leads that are new when compared to 03/03/2014.  ASSESSMENT AND PLAN  1. DOE - ECG shows new neg T waves in the inferolateral leads, I will order an exercise treadmill stress test.  2. PAF - continue the same regimen with diltiazem PRN, none on daily basis as she is bradycardic at baseline. On Warfarin. CHA2DS2 - VASc score of 4.  3. Essential hypertension - controlled for age  81. Bruit - B/L carotid stenosis only 1-39%  Follow up in 3 months.  [name], MD 03/02/2016

## 2016-03-02 NOTE — Patient Instructions (Signed)
Medication Instructions:   DR [name] HAS PRESCRIBED YOU CARDIZEM 60 MG ONCE DAILY ONLY AS NEEDED FOR PAROXYMAL ATRIAL FIBRILLATION   Labwork:  TODAY--CMET, CBC W DIFF, AND PT/INR   Testing/Procedures:  Your physician has requested that you have en exercise stress myoview. For further information please visit [email or web address]. Please follow instruction sheet, as given.    Follow-Up:  3 MONTHS WITH DR [name]       If you need a refill on your cardiac medications before your next appointment, please call your pharmacy.

## 2016-03-03 MED ORDER — LOSARTAN POTASSIUM 50 MG PO TABS
50.0000 mg | ORAL_TABLET | Freq: Two times a day (BID) | ORAL | Status: DC
Start: 2016-03-03 — End: 2016-06-10

## 2016-03-15 ENCOUNTER — Telehealth (HOSPITAL_COMMUNITY): Payer: Self-pay | Admitting: *Deleted

## 2016-03-15 NOTE — Telephone Encounter (Signed)
Patient given detailed instructions per Myocardial Perfusion Study Information Sheet for the test on 03/18/16 at 0930. Patient notified to arrive 15 minutes early and that it is imperative to arrive on time for appointment to keep from having the test rescheduled.  If you need to cancel or reschedule your appointment, please call the office within 24 hours of your appointment. Failure to do so may result in a cancellation of your appointment, and a $50 no show fee. Patient verbalized understanding.Sara Griffin, [place]anae [place]

## 2016-03-18 ENCOUNTER — Ambulatory Visit (HOSPITAL_COMMUNITY): Payer: Medicare Other | Attending: Cardiology

## 2016-03-18 ENCOUNTER — Encounter (HOSPITAL_COMMUNITY): Payer: PRIVATE HEALTH INSURANCE

## 2016-03-18 DIAGNOSIS — Z7901 Long term (current) use of anticoagulants: Secondary | ICD-10-CM | POA: Insufficient documentation

## 2016-03-18 DIAGNOSIS — R072 Precordial pain: Secondary | ICD-10-CM | POA: Diagnosis not present

## 2016-03-18 DIAGNOSIS — Z79899 Other long term (current) drug therapy: Secondary | ICD-10-CM | POA: Diagnosis not present

## 2016-03-18 DIAGNOSIS — I10 Essential (primary) hypertension: Secondary | ICD-10-CM | POA: Insufficient documentation

## 2016-03-18 DIAGNOSIS — I480 Paroxysmal atrial fibrillation: Secondary | ICD-10-CM | POA: Insufficient documentation

## 2016-03-18 DIAGNOSIS — R9439 Abnormal result of other cardiovascular function study: Secondary | ICD-10-CM | POA: Insufficient documentation

## 2016-03-18 DIAGNOSIS — I779 Disorder of arteries and arterioles, unspecified: Secondary | ICD-10-CM | POA: Insufficient documentation

## 2016-03-18 DIAGNOSIS — R0609 Other forms of dyspnea: Secondary | ICD-10-CM | POA: Diagnosis present

## 2016-03-18 LAB — MYOCARDIAL PERFUSION IMAGING
LV dias vol: 59 mL (ref 46–106)
LV sys vol: 25 mL
Peak HR: 85 {beats}/min
RATE: 0.24
Rest HR: 60 {beats}/min
SDS: 0
SRS: 3
SSS: 3
TID: 1.08

## 2016-03-18 MED ORDER — TECHNETIUM TC 99M TETROFOSMIN IV KIT
11.0000 | PACK | Freq: Once | INTRAVENOUS | Status: AC | PRN
  Administered 2016-03-18: 11 via INTRAVENOUS
  Filled 2016-03-18: qty 11

## 2016-03-18 MED ORDER — TECHNETIUM TC 99M TETROFOSMIN IV KIT
31.7000 | PACK | Freq: Once | INTRAVENOUS | Status: AC | PRN
  Administered 2016-03-18: 31.7 via INTRAVENOUS
  Filled 2016-03-18: qty 32

## 2016-03-18 MED ORDER — REGADENOSON 0.4 MG/5ML IV SOLN
0.4000 mg | Freq: Once | INTRAVENOUS | Status: AC
  Administered 2016-03-18: 0.4 mg via INTRAVENOUS

## 2016-04-14 DIAGNOSIS — L309 Dermatitis, unspecified: Secondary | ICD-10-CM | POA: Insufficient documentation

## 2016-04-14 DIAGNOSIS — B372 Candidiasis of skin and nail: Secondary | ICD-10-CM | POA: Insufficient documentation

## 2016-06-10 ENCOUNTER — Ambulatory Visit (INDEPENDENT_AMBULATORY_CARE_PROVIDER_SITE_OTHER): Payer: Medicare Other | Admitting: Cardiology

## 2016-06-10 VITALS — BP 124/80 | HR 67 | Ht 64.0 in | Wt 125.0 lb

## 2016-06-10 DIAGNOSIS — I10 Essential (primary) hypertension: Secondary | ICD-10-CM

## 2016-06-10 DIAGNOSIS — Z7901 Long term (current) use of anticoagulants: Secondary | ICD-10-CM | POA: Diagnosis not present

## 2016-06-10 DIAGNOSIS — I480 Paroxysmal atrial fibrillation: Secondary | ICD-10-CM | POA: Diagnosis not present

## 2016-06-10 DIAGNOSIS — R0600 Dyspnea, unspecified: Secondary | ICD-10-CM

## 2016-06-10 DIAGNOSIS — E785 Hyperlipidemia, unspecified: Secondary | ICD-10-CM | POA: Diagnosis not present

## 2016-06-10 DIAGNOSIS — R0609 Other forms of dyspnea: Secondary | ICD-10-CM

## 2016-06-10 MED ORDER — LOSARTAN POTASSIUM 25 MG PO TABS: 25.0000 mg | ORAL_TABLET | Freq: Every day | ORAL | 6 refills | Status: DC

## 2016-06-10 NOTE — Progress Notes (Signed)
Cardiology Office Note    Date:  06/10/2016   ID:  Sara Griffin, DOB Mar 27, 1930, MRN [id number]  PCP:  [name], MD  Cardiologist:   Ena Dawley, MD   No chief complaint on file.   History of Present Illness:  Sara Griffin is a 82 y.o. female followed for paroxysmal atrial fibrillation, hyperlipidemia and hypertension. She was seen in May 2017 when she complained of Versed and dyspnea on exertion and underwent an exercise nuclear stress test that was negative for ischemia she showed good access capacity for 82 year old. Today she denies any chest pain she says her symptoms are shortness of breath have improved, she is no lower extremity edema orthopnea or proximal nocturnal dyspnea. Her new concern is that she feels very weak especially in the morning and her blood pressure tends to be real low. No syncope.  Past Medical History:  Diagnosis Date  . Atrial fibrillation ([place])   . Hx of colonic polyps   . Hyperlipidemia   . Hypertension   . Uterine cancer [facility])     Past Surgical History:  Procedure Laterality Date  . ABDOMINAL HYSTERECTOMY    . PARTIAL COLECTOMY      Current Medications: Outpatient Medications Prior to Visit  Medication Sig Dispense Refill  . aspirin 81 MG tablet Take 81 mg by mouth as needed. Taking as needed when heart feels funny    . diltiazem (CARDIZEM) 60 MG tablet Take 1 tablet (60 mg total) by mouth daily as needed (FOR PARAXYMAL ATRIAL FIBRILLATION). 90 tablet 3  . losartan (COZAAR) 50 MG tablet Take 1 tablet (50 mg total) by mouth 2 (two) times daily. 60 tablet 6  . warfarin (COUMADIN) 5 MG tablet Take 5 mg by mouth daily.     No facility-administered medications prior to visit.      Allergies:   Hydrocod polst-cpm polst er   Social History   Social History  . Marital status: Married    Spouse name: N/A  . Number of children: 5  . Years of education: N/A   Social History Main Topics  . Smoking status: Never Smoker  .  Smokeless tobacco: Not on file  . Alcohol use Not on file  . Drug use: Unknown  . Sexual activity: Not on file   Other Topics Concern  . Not on file   Social History Narrative  . No narrative on file     Family History:  The patient's family history includes Heart attack (age of onset: 40) in her son; Heart attack (age of onset: 16) in her father.   ROS:   Please see the history of present illness.    ROS All other systems reviewed and are negative.   PHYSICAL EXAM:   VS:  BP 124/80   Pulse 67   Ht 5\' 4"  (1.626 m)   Wt 125 lb (56.7 kg)   SpO2 94%   BMI 21.46 kg/m    GEN: Well nourished, well developed, in no acute distress  HEENT: normal  Neck: no JVD, carotid bruits, or masses Cardiac: RRR; no murmurs, rubs, or gallops,no edema  Respiratory:  clear to auscultation bilaterally, normal work of breathing GI: soft, nontender, nondistended, + BS MS: no deformity or atrophy  Skin: warm and dry, no rash Neuro:  Alert and Oriented x 3, Strength and sensation are intact Psych: euthymic mood, full affect  Wt Readings from Last 3 Encounters:  06/10/16 125 lb (56.7 kg)  03/02/16 130 lb (59 kg)  03/02/15 130 lb (59 kg)    Studies/Labs Reviewed:    Recent Labs: 03/02/2016: ALT 23; BUN 20; Creat 0.91; Hemoglobin 16.0; Platelets 162; Potassium 5.0; Sodium 139   Lipid Panel No results found for: CHOL, TRIG, HDL, CHOLHDL, VLDL, LDLCALC, LDLDIRECT  EKG:  Performed today 03/02/2016 showed normal sinus rhythm old anterior infarct age undetermined and negative T waves in inferolateral leads that are new when compared to 03/03/2014.  Nuclear stress test: 02/2016  Nuclear stress EF: 57%.  Defect 1: There is a medium defect of mild severity present in the mid inferoseptal location. Low risk stress nuclear study with inferoseptal thinning; no ischemia; EF 57 with normal wall motion.     ASSESSMENT:    No diagnosis found.   PLAN:  In order of problems listed above:  1.  DOE - ECG showed new neg T waves in the inferolateral leads, an exercise treadmill nuclear stress test was negative for ischemia. LVEF is normal. No further workup at this point.  2. PAF - continue the same regimen with diltiazem PRN, none on daily basis as she is bradycardic at baseline. On Warfarin, INR is always 2-3 and is no bleeding complications. CHA2DS2 - VASc score of 4.  3. Essential hypertension - currently pillow with symptoms of dizziness, I will decrease to losartan 25 mg by mouth daily.   4. Bruit - B/L carotid stenosis only 1-39%  Follow up in 6 months.     Medication Adjustments/Labs and Tests Ordered: Current medicines are reviewed at length with the patient today.  Concerns regarding medicines are outlined above.  Medication changes, Labs and Tests ordered today are listed in the Patient Instructions below. There are no Patient Instructions on file for this visit.   Signed, Ena Dawley, MD  06/10/2016 11:22 AM    [facility] [address], [place], [place]  [address] Phone: [phone]; Fax: [phone]

## 2016-06-10 NOTE — Patient Instructions (Signed)
Medication Instructions:   DECREASE YOUR LOSARTAN TO 25 MG ONCE DAILY    Follow-Up:  Your physician wants you to follow-up in: [place] will receive a reminder letter in the mail two months in advance. If you don't receive a letter, please call our office to schedule the follow-up appointment.       If you need a refill on your cardiac medications before your next appointment, please call your pharmacy.

## 2016-06-22 DIAGNOSIS — Z131 Encounter for screening for diabetes mellitus: Secondary | ICD-10-CM | POA: Insufficient documentation

## 2016-10-20 ENCOUNTER — Encounter (HOSPITAL_COMMUNITY): Payer: Self-pay | Admitting: Emergency Medicine

## 2016-10-20 ENCOUNTER — Other Ambulatory Visit: Payer: Self-pay

## 2016-10-20 ENCOUNTER — Inpatient Hospital Stay (HOSPITAL_COMMUNITY)
Admission: EM | Admit: 2016-10-20 | Discharge: 2016-10-23 | DRG: 316 | Disposition: A | Discharge status: Home / Self Care | Payer: Medicare Other | Attending: Internal Medicine | Admitting: Internal Medicine

## 2016-10-20 DIAGNOSIS — Z8249 Family history of ischemic heart disease and other diseases of the circulatory system: Secondary | ICD-10-CM | POA: Diagnosis not present

## 2016-10-20 DIAGNOSIS — Z79899 Other long term (current) drug therapy: Secondary | ICD-10-CM

## 2016-10-20 DIAGNOSIS — Z90710 Acquired absence of both cervix and uterus: Secondary | ICD-10-CM | POA: Diagnosis not present

## 2016-10-20 DIAGNOSIS — I480 Paroxysmal atrial fibrillation: Secondary | ICD-10-CM | POA: Diagnosis present

## 2016-10-20 DIAGNOSIS — E8779 Other fluid overload: Secondary | ICD-10-CM

## 2016-10-20 DIAGNOSIS — I272 Pulmonary hypertension, unspecified: Secondary | ICD-10-CM | POA: Diagnosis present

## 2016-10-20 DIAGNOSIS — R079 Chest pain, unspecified: Secondary | ICD-10-CM | POA: Diagnosis not present

## 2016-10-20 DIAGNOSIS — Z7901 Long term (current) use of anticoagulants: Secondary | ICD-10-CM

## 2016-10-20 DIAGNOSIS — R0602 Shortness of breath: Secondary | ICD-10-CM

## 2016-10-20 DIAGNOSIS — Z86010 Personal history of colonic polyps: Secondary | ICD-10-CM | POA: Diagnosis not present

## 2016-10-20 DIAGNOSIS — I10 Essential (primary) hypertension: Secondary | ICD-10-CM | POA: Diagnosis present

## 2016-10-20 DIAGNOSIS — Z7982 Long term (current) use of aspirin: Secondary | ICD-10-CM | POA: Diagnosis not present

## 2016-10-20 DIAGNOSIS — I482 Chronic atrial fibrillation, unspecified: Secondary | ICD-10-CM | POA: Diagnosis present

## 2016-10-20 DIAGNOSIS — I420 Dilated cardiomyopathy: Secondary | ICD-10-CM | POA: Diagnosis present

## 2016-10-20 DIAGNOSIS — Z8542 Personal history of malignant neoplasm of other parts of uterus: Secondary | ICD-10-CM

## 2016-10-20 DIAGNOSIS — I481 Persistent atrial fibrillation: Secondary | ICD-10-CM | POA: Diagnosis not present

## 2016-10-20 DIAGNOSIS — Z885 Allergy status to narcotic agent status: Secondary | ICD-10-CM | POA: Diagnosis not present

## 2016-10-20 DIAGNOSIS — I4891 Unspecified atrial fibrillation: Secondary | ICD-10-CM | POA: Diagnosis present

## 2016-10-20 DIAGNOSIS — E785 Hyperlipidemia, unspecified: Secondary | ICD-10-CM | POA: Diagnosis present

## 2016-10-20 LAB — URINALYSIS, ROUTINE W REFLEX MICROSCOPIC
BILIRUBIN URINE: NEGATIVE
Bacteria, UA: NONE SEEN
Glucose, UA: NEGATIVE mg/dL
Ketones, ur: NEGATIVE mg/dL
LEUKOCYTES UA: NEGATIVE
Nitrite: NEGATIVE
PH: 5 (ref 5.0–8.0)
Protein, ur: NEGATIVE mg/dL
SPECIFIC GRAVITY, URINE: 1.004 — AB (ref 1.005–1.030)
SQUAMOUS EPITHELIAL / LPF: NONE SEEN

## 2016-10-20 LAB — BASIC METABOLIC PANEL
ANION GAP: 10 (ref 5–15)
BUN: 17 mg/dL (ref 6–20)
CALCIUM: 9.1 mg/dL (ref 8.9–10.3)
CHLORIDE: 108 mmol/L (ref 101–111)
CO2: 22 mmol/L (ref 22–32)
Creatinine, Ser: 0.91 mg/dL (ref 0.44–1.00)
GFR calc non Af Amer: 56 mL/min — ABNORMAL LOW (ref >60)
Glucose, Bld: 94 mg/dL (ref 65–99)
Potassium: 4.3 mmol/L (ref 3.5–5.1)
SODIUM: 140 mmol/L (ref 135–145)

## 2016-10-20 LAB — PROTIME-INR
INR: 2.49
PROTHROMBIN TIME: 27.4 s — AB (ref 11.4–15.2)

## 2016-10-20 LAB — CBC
HCT: 46.7 % — ABNORMAL HIGH (ref 36.0–46.0)
HEMOGLOBIN: 15.4 g/dL — AB (ref 12.0–15.0)
MCH: 31.9 pg (ref 26.0–34.0)
MCHC: 33 g/dL (ref 30.0–36.0)
MCV: 96.7 fL (ref 78.0–100.0)
Platelets: 171 10*3/uL (ref 150–400)
RBC: 4.83 MIL/uL (ref 3.87–5.11)
RDW: 13.5 % (ref 11.5–15.5)
WBC: 6 10*3/uL (ref 4.0–10.5)

## 2016-10-20 LAB — I-STAT TROPONIN, ED: TROPONIN I, POC: 0.02 ng/mL (ref 0.00–0.08)

## 2016-10-20 LAB — BRAIN NATRIURETIC PEPTIDE: B NATRIURETIC PEPTIDE 5: 530.7 pg/mL — AB (ref 0.0–100.0)

## 2016-10-20 MED ORDER — DILTIAZEM HCL 60 MG PO TABS
60.0000 mg | ORAL_TABLET | Freq: Once | ORAL | Status: DC
  Filled 2016-10-20: qty 1

## 2016-10-20 MED ORDER — DILTIAZEM HCL 25 MG/5ML IV SOLN
10.0000 mg | Freq: Once | INTRAVENOUS | Status: AC
  Administered 2016-10-20: 10 mg via INTRAVENOUS
  Filled 2016-10-20: qty 5

## 2016-10-20 MED ORDER — FUROSEMIDE 10 MG/ML IJ SOLN
20.0000 mg | Freq: Once | INTRAMUSCULAR | Status: AC
  Administered 2016-10-20: 20 mg via INTRAVENOUS
  Filled 2016-10-20: qty 2

## 2016-10-20 MED ORDER — DILTIAZEM HCL 100 MG IV SOLR
5.0000 mg/h | INTRAVENOUS | Status: DC
  Administered 2016-10-20: 5 mg/h via INTRAVENOUS
  Filled 2016-10-20 (×2): qty 100

## 2016-10-20 NOTE — ED Triage Notes (Signed)
Pt reports she has been short of breath and having sharp chest pains since last night. Pt is in A fib RVR in triage. ekg done.

## 2016-10-20 NOTE — ED Notes (Signed)
MD Little at the bedside  

## 2016-10-20 NOTE — ED Notes (Signed)
IV team at bedside at this time. 

## 2016-10-20 NOTE — H&P (Signed)
Sara Griffin [id number] DOB: 1930/07/03 DOA: 10/20/2016     PCP: [name], MD   Outpatient Specialists: [name] Cardiologist Patient coming from:   home Lives alone,      Chief Complaint: palpitation, chest pain SOB  HPI: Sara Griffin is a 82 y.o. female with medical history significant of a.fib  On anticoagulation, hyperlipidemia and hypertension, uterine cancer  Presented with a 24-hour history of worsening dyspnea and exertion as well as at rest with occasional sharp chest pain lasting just a few seconds at a time. Patient has known history of atrial fibrillation but not on any agent long-term secondary to propensity to develop bradycardia when she converts back to sinus rhythm. Patient states she only takes Cardizem "when she feels bad" she does not check her HR or BP prior to taking it. She is unaware of its affects or purpose. Spoke at length with patient and family to make them aware that Cardizem is ment to be taken if her HR is elevated and she has normal or  high blood pressure. She reports occasional palpitations but hasn't been felling well lately for the past 24 hours. Also reports nonproductive cough but no fevers or chills. On her presentation to emergency department patient was found to be in A. fib with RVR with heart rate up to 140s. Her cough has not resolved. She hasn't had any chest pain today, currently chest pain free. Given lower extremity edema and some shortness of breath patient was given Lasix emerge department and has diuresed her lower extremity edema now resolved   Regarding pertinent Chronic problems: in May 2017 was seen for dyspnea on exertion had nuclear stress test Was Negative for Ischemia. Patient has history of hyperlipidemia controlled by her family medicine provider not at goal total cholesterol over 300   IN ER:  Temp (24hrs), Avg:98 F (36.7 C), Min:98 F (36.7 C), Max:98 F (36.7 C)      RR 20 satting 92% HR initially 146 BP  155/96 K4.3 currently 0.91 which is baseline WBC 6.0 hemoglobin 15.4 INR 2.49 BNP 530 troponin 0.02 Following Medications were ordered in ER: Medications  diltiazem (CARDIZEM) tablet 60 mg (60 mg Oral Not Given 10/20/16 1817)  diltiazem (CARDIZEM) injection 10 mg (10 mg Intravenous Given 10/20/16 1631)  furosemide (LASIX) injection 20 mg (20 mg Intravenous Given 10/20/16 1824)     ER provider discussed case with: [name] with cardiology rec administering diltiazem PO and observe over night As patient has propensity to develop bradycardia  Hospitalist was called for admission for A.fib w RVR and chest pain   Review of Systems:    Pertinent positives include: chest pain, palpitations shortness of breath at rest. dyspnea on exertion,  non-productive cough, Constitutional:  No weight loss, night sweats, Fevers, chills, fatigue, weight loss  HEENT:  No headaches, Difficulty swallowing,Tooth/dental problems,Sore throat,  No sneezing, itching, ear ache, nasal congestion, post nasal drip,  Cardio-vascular:  No  Orthopnea, PND, anasarca, dizziness, .no Bilateral lower extremity swelling  GI:  No heartburn, indigestion, abdominal pain, nausea, vomiting, diarrhea, change in bowel habits, loss of appetite, melena, blood in stool, hematemesis Resp:    No excess mucus, no productive cough, No No coughing up of blood.No change in color of mucus.No wheezing. Skin:  no rash or lesions. No jaundice GU:  no dysuria, change in color of urine, no urgency or frequency. No straining to urinate.  No flank pain.  Musculoskeletal:  No joint pain or no joint swelling.  No decreased range of motion. No back pain.  Psych:  No change in mood or affect. No depression or anxiety. No memory loss.  Neuro: no localizing neurological complaints, no tingling, no weakness, no double vision, no gait abnormality, no slurred speech, no confusion  As per HPI otherwise 10 point review of systems negative.   Past  Medical History: Past Medical History:  Diagnosis Date  . Atrial fibrillation ([place])   . Hx of colonic polyps   . Hyperlipidemia   . Hypertension   . Uterine cancer [facility])    Past Surgical History:  Procedure Laterality Date  . ABDOMINAL HYSTERECTOMY    . PARTIAL COLECTOMY       Social History:  Ambulatory   independently      reports that she has never smoked. She does not have any smokeless tobacco history on file. Her alcohol and drug histories are not on file.  Allergies:   Allergies  Allergen Reactions  . Hydrocod Polst-Cpm Polst Er Other (See Comments)    Felt bad       Family History:   Family History  Problem Relation Age of Onset  . Heart attack Father 93    died  . Heart disease      famil,y hx of  . Heart attack Son 82    died    Medications: Prior to Admission medications   Medication Sig Start Date End Date Taking? Authorizing Provider  diltiazem (CARDIZEM) 60 MG tablet Take 1 tablet (60 mg total) by mouth daily as needed (FOR PARAXYMAL ATRIAL FIBRILLATION). 03/02/16  Yes [name], MD  losartan (COZAAR) 25 MG tablet Take 1 tablet (25 mg total) by mouth daily. 06/10/16  Yes [name], MD  warfarin (JANTOVEN) 5 MG tablet Take 5 mg by mouth daily. 03/08/16  Yes Historical Provider, MD    Physical Exam: Patient Vitals for the past 24 hrs:  BP Temp Temp src Pulse Resp SpO2 Height  10/20/16 1815 155/96 - - 68 20 92 % -  10/20/16 1800 150/86 - - (!) 51 21 94 % -  10/20/16 1730 130/96 - - 63 21 94 % -  10/20/16 1700 149/94 - - 68 (!) 27 91 % -  10/20/16 1645 135/83 - - - 19 93 % -  10/20/16 1630 (!) 147/127 - - (!) 146 19 93 % -  10/20/16 1615 (!) 158/118 - - (!) 137 20 93 % -  10/20/16 1604 (!) 150/110 - - (!) 123 (!) 28 93 % -  10/20/16 1436 127/77 98 F (36.7 C) Oral (!) 154 18 99 % 5\' 4"  (1.626 m)    1. General:  in No Acute distress 2. Psychological: Alert and  Oriented 3. Head/ENT:   Dry Mucous Membranes                           Head Non traumatic, neck supple                           Poor Dentition 4. SKIN:   decreased Skin turgor,  Skin clean Dry and intact no rash 5. Heart: irRegular rate and rhythm no Murmur, Rub or gallop 6. Lungs:  no wheezes or crackles   7. Abdomen: Soft,  non-tender, Non distended 8. Lower extremities: no clubbing, cyanosis, or edema 9. Neurologically Grossly intact, moving all 4 extremities equally   10. MSK: Normal range of  motion   body mass index is unknown because there is no height or weight on file.  Labs on Admission:   Labs on Admission: I have personally reviewed following labs and imaging studies  CBC:  Recent Labs Lab 10/20/16 1438  WBC 6.0  HGB 15.4*  HCT 46.7*  MCV 96.7  PLT [id number]   Basic Metabolic Panel:  Recent Labs Lab 10/20/16 1438  NA 140  K 4.3  CL 108  CO2 22  GLUCOSE 94  BUN 17  CREATININE 0.91  CALCIUM 9.1   GFR: CrCl cannot be calculated (Unknown ideal weight.). Liver Function Tests: No results for input(s): AST, ALT, ALKPHOS, BILITOT, PROT, ALBUMIN in the last 168 hours. No results for input(s): LIPASE, AMYLASE in the last 168 hours. No results for input(s): AMMONIA in the last 168 hours. Coagulation Profile:  Recent Labs Lab 10/20/16 1438  INR 2.49   Cardiac Enzymes: No results for input(s): CKTOTAL, CKMB, CKMBINDEX, TROPONINI in the last 168 hours. BNP (last 3 results) No results for input(s): PROBNP in the last 8760 hours. HbA1C: No results for input(s): HGBA1C in the last 72 hours. CBG: No results for input(s): GLUCAP in the last 168 hours. Lipid Profile: No results for input(s): CHOL, HDL, LDLCALC, TRIG, CHOLHDL, LDLDIRECT in the last 72 hours. Thyroid Function Tests: No results for input(s): TSH, T4TOTAL, FREET4, T3FREE, THYROIDAB in the last 72 hours. Anemia Panel: No results for input(s): VITAMINB12, FOLATE, FERRITIN, TIBC, IRON, RETICCTPCT in the last 72 hours. Urine analysis: No results found for:  COLORURINE, APPEARANCEUR, LABSPEC, PHURINE, GLUCOSEU, HGBUR, BILIRUBINUR, KETONESUR, PROTEINUR, UROBILINOGEN, NITRITE, LEUKOCYTESUR Sepsis Labs: @LABRCNTIP (procalcitonin:4,lacticidven:4) )No results found for this or any previous visit (from the past 240 hour(s)).    UA  ordered  No results found for: HGBA1C  CrCl cannot be calculated (Unknown ideal weight.).  BNP (last 3 results) No results for input(s): PROBNP in the last 8760 hours.   ECG REPORT  Independently reviewed Rate: 143  Rhythm: Right axis deviation ST&T Change: No acute ischemic changes   QTC 512      There were no vitals filed for this visit.   Cultures: No results found for: [place], [place], [place], REPTSTATUS   Radiological Exams on Admission: No results found.  Chart has been reviewed    Assessment/Plan   82 y.o. female with medical history significant of a.fib  On anticoagulation, hyperlipidemia and hypertension, uterine cancer being admitted for A.fib w RVR and chest pain    Present on Admission: . Atrial fibrillation with RVR (HCC) -  - Admit to step down on Cardizem drip       CHA2D-VASC score 5       Will contiue coumadin,                  Check TSH      Cycle cardiac enzymes      Obtain ECHO      Cardiology consult in AM       . Essential hypertension would convert to by mouth Cardizem when heart rate is  controlled examination needs better education requiring use of when necessary Cardizem versus changing medication approach. Appreciate cardiology consult to discuss this with her. Patient has been seen extensively by cardiology in the past and had her medications adjusted . Chest pain cycle cardiac enzymes and  Monitor on  telemetry Fluid overload - clinically evidence of fluid overload likely in a setting of atrial fibrillation will order echo gram   Other plan as per  orders.  DVT prophylaxis:  On coumadin  Code Status:  FULL CODE as per patient    Family Communication:   Family  at  Bedside  plan of care was discussed with  Son,   Disposition Plan:     To home once workup is complete and patient is stable                    Consults called: Cardiology   Admission status:    inpatient     Level of care       SDU      I have spent a total of 56 min on this admission  Rastus Borton 10/20/2016, 8:35 PM    [facility]  Pager [phone]   after 2 AM please page floor coverage PA If 7AM-7PM, please contact the day team taking care of the patient  [email or web address]  Password [email or web address]

## 2016-10-20 NOTE — ED Provider Notes (Signed)
[name] DEPT Provider Note   CSN: [id number] Arrival date & time: 10/20/16 1401     History    Chief Complaint  Patient presents with  . Shortness of Breath     HPI Sara Griffin is a 82 y.o. female.  82yo F w/ A fib on coumadin, HTN, HLD who p/w shortness of breath and chest pain. Last night around bedtime, the patient states that she began having a deep cough productive of mucus and she then started feeling bad. She reports intermittent, sharp, central chest pain lasting a second in duration and occurring intermittently, associated with some shortness of breath. These symptoms have been present today but are better than yesterday. She denies any problem with cough today. No fevers or recent illness. She has not taken any diltiazem today but has been compliant with her other medications. She has not been able to tell that her heart is racing.  Past Medical History:  Diagnosis Date  . Atrial fibrillation ([place])   . Hx of colonic polyps   . Hyperlipidemia   . Hypertension   . Uterine cancer [facility])      Patient Active Problem List   Diagnosis Date Noted  . Intertriginous candidiasis 04/14/2016  . Dermatitis 04/14/2016  . Paroxysmal atrial fibrillation ([place]) 03/02/2016  . Encounter for long-term (current) use of high-risk medication 03/02/2016  . Warfarin anticoagulation 03/02/2016  . DOE (dyspnea on exertion) 03/02/2016  . Chest pain 03/02/2016  . Chronic atrial fibrillation ([place]) 01/27/2010  . SUPRAVENTRICULAR TACHYCARDIA 01/27/2010  . Cardiovascular symptoms 01/27/2010  . PALPITATIONS 01/15/2010  . HYPERLIPIDEMIA 01/14/2010  . Essential hypertension 01/14/2010    Past Surgical History:  Procedure Laterality Date  . ABDOMINAL HYSTERECTOMY    . PARTIAL COLECTOMY      OB History    No data available        Home Medications    Prior to Admission medications   Medication Sig Start Date End Date Taking? Authorizing Provider  diltiazem (CARDIZEM) 60  MG tablet Take 1 tablet (60 mg total) by mouth daily as needed (FOR PARAXYMAL ATRIAL FIBRILLATION). 03/02/16  Yes [name], MD  losartan (COZAAR) 25 MG tablet Take 1 tablet (25 mg total) by mouth daily. 06/10/16  Yes [name], MD  warfarin (JANTOVEN) 5 MG tablet Take 5 mg by mouth daily. 03/08/16  Yes Historical Provider, MD      Family History  Problem Relation Age of Onset  . Heart attack Father 69    died  . Heart disease      famil,y hx of  . Heart attack Son 51    died     Social History  Substance Use Topics  . Smoking status: Never Smoker  . Smokeless tobacco: Not on file  . Alcohol use Not on file     Allergies     Hydrocod polst-cpm polst er    Review of Systems  10 Systems reviewed and are negative for acute change except as noted in the HPI.   Physical Exam Updated Vital Signs BP (!) 150/110 (BP Location: Right Arm)   Pulse (!) 123   Temp 98 F (36.7 C) (Oral)   Resp (!) 28   Ht 5\' 4"  (1.626 m)   SpO2 93%   Physical Exam  Constitutional: She is oriented to person, place, and time. She appears well-developed and well-nourished. No distress.  HENT:  Head: Normocephalic and atraumatic.  Moist mucous membranes  Eyes: Conjunctivae are normal. Pupils are  equal, round, and reactive to light.  Neck: Neck supple.  Cardiovascular: Normal heart sounds.  An irregularly irregular rhythm present. Tachycardia present.   No murmur heard. Pulmonary/Chest: Effort normal and breath sounds normal.  Abdominal: Soft. Bowel sounds are normal. She exhibits no distension. There is no tenderness.  Musculoskeletal: She exhibits no edema.  Neurological: She is alert and oriented to person, place, and time.  Fluent speech  Skin: Skin is warm and dry.  Psychiatric: She has a normal mood and affect. Judgment normal.  Nursing note and vitals reviewed.     ED Treatments / Results  Labs (all labs ordered are listed, but only abnormal results are  displayed) Labs Reviewed  BASIC METABOLIC PANEL - Abnormal; Notable for the following:       Result Value   GFR calc non Af Amer 56 (*)    All other components within normal limits  CBC - Abnormal; Notable for the following:    Hemoglobin 15.4 (*)    HCT 46.7 (*)    All other components within normal limits  PROTIME-INR - Abnormal; Notable for the following:    Prothrombin Time 27.4 (*)    All other components within normal limits  BRAIN NATRIURETIC PEPTIDE - Abnormal; Notable for the following:    B Natriuretic Peptide 530.7 (*)    All other components within normal limits  URINALYSIS, ROUTINE W REFLEX MICROSCOPIC - Abnormal; Notable for the following:    Color, Urine COLORLESS (*)    Specific Gravity, Urine 1.004 (*)    Hgb urine dipstick SMALL (*)    All other components within normal limits  I-STAT TROPOININ, ED     EKG  EKG Interpretation  Date/Time:  Thursday October 20 2016 14:32:49 EST Ventricular Rate:  143 PR Interval:    QRS Duration: 102 QT Interval:  332 QTC Calculation: 512 R Axis:   115 Text Interpretation:  Atrial fibrillation with rapid ventricular response Right axis deviation Anteroseptal infarct , age undetermined Abnormal ECG No previous ECGs available Confirmed by LITTLE MD, RACHEL [id number]) on 10/20/2016 3:46:53 PM         Radiology No results found.  Procedures Procedures (including critical care time) Procedures  Medications Ordered in ED  Medications  diltiazem (CARDIZEM) injection 10 mg (not administered)     Initial Impression / Assessment and Plan / ED Course  I have reviewed the triage vital signs and the nursing notes.  Pertinent labs & imaging results that were available during my care of the patient were reviewed by me and considered in my medical decision making (see chart for details).  Clinical Course     PT w/ a fib on coumadin p/w intermittent sharp chest pains since last night, lasting a second at a time, associated  with shortness of breath. She was in atrial fibrillation with RVR on arrival, mildly hypertensive. Mentating appropriately and comfortable on exam. Gave 10 mg IV diltiazem and obtained above labs.   Patient's heart rate responded well to single dose of diltiazem. Gave 60 mg PO dilt.  labwork shows normal troponin, INR 2.49, BNP slightly elevated at 530. CBC and BMP reassuring. Gave a dose of IV Lasix given BNP. I discussed with cardiology, [name], who recommended overnight medicine admission given that the patient has a history of bradycardia when in sinus rhythm on diltiazem. I discussed with [facility] hospitalist, Dr. Roel Cluck, appreciate her assistance. Patient admitted for further treatment.   Final Clinical Impressions(s) / ED Diagnoses   Final diagnoses:  Atrial fibrillation with rapid ventricular response (HCC)  Chest pain, unspecified type  SOB (shortness of breath)     New Prescriptions   No medications on file       Sharlett Iles, MD 10/21/16 0020

## 2016-10-21 ENCOUNTER — Encounter (HOSPITAL_COMMUNITY): Payer: Self-pay

## 2016-10-21 ENCOUNTER — Inpatient Hospital Stay (HOSPITAL_COMMUNITY): Payer: Medicare Other

## 2016-10-21 DIAGNOSIS — I420 Dilated cardiomyopathy: Principal | ICD-10-CM

## 2016-10-21 DIAGNOSIS — R0602 Shortness of breath: Secondary | ICD-10-CM

## 2016-10-21 DIAGNOSIS — I4891 Unspecified atrial fibrillation: Secondary | ICD-10-CM

## 2016-10-21 DIAGNOSIS — R079 Chest pain, unspecified: Secondary | ICD-10-CM

## 2016-10-21 DIAGNOSIS — I10 Essential (primary) hypertension: Secondary | ICD-10-CM

## 2016-10-21 DIAGNOSIS — I272 Pulmonary hypertension, unspecified: Secondary | ICD-10-CM

## 2016-10-21 LAB — TROPONIN I
Troponin I: 0.03 ng/mL (ref <0.03)
Troponin I: 0.03 ng/mL (ref <0.03)

## 2016-10-21 LAB — COMPREHENSIVE METABOLIC PANEL
ALT: 28 U/L (ref 14–54)
AST: 29 U/L (ref 15–41)
Albumin: 3.4 g/dL — ABNORMAL LOW (ref 3.5–5.0)
Alkaline Phosphatase: 65 U/L (ref 38–126)
Anion gap: 9 (ref 5–15)
BUN: 16 mg/dL (ref 6–20)
CO2: 24 mmol/L (ref 22–32)
Calcium: 8.7 mg/dL — ABNORMAL LOW (ref 8.9–10.3)
Chloride: 107 mmol/L (ref 101–111)
Creatinine, Ser: 0.89 mg/dL (ref 0.44–1.00)
GFR calc Af Amer: >60 mL/min (ref >60)
GFR, EST NON AFRICAN AMERICAN: 58 mL/min — AB (ref >60)
Glucose, Bld: 90 mg/dL (ref 65–99)
POTASSIUM: 3.7 mmol/L (ref 3.5–5.1)
SODIUM: 140 mmol/L (ref 135–145)
Total Bilirubin: 1.9 mg/dL — ABNORMAL HIGH (ref 0.3–1.2)
Total Protein: 5.6 g/dL — ABNORMAL LOW (ref 6.5–8.1)

## 2016-10-21 LAB — CBC
HEMATOCRIT: 44.6 % (ref 36.0–46.0)
Hemoglobin: 15 g/dL (ref 12.0–15.0)
MCH: 32.4 pg (ref 26.0–34.0)
MCHC: 33.6 g/dL (ref 30.0–36.0)
MCV: 96.3 fL (ref 78.0–100.0)
Platelets: 166 10*3/uL (ref 150–400)
RBC: 4.63 MIL/uL (ref 3.87–5.11)
RDW: 13.7 % (ref 11.5–15.5)
WBC: 5.2 10*3/uL (ref 4.0–10.5)

## 2016-10-21 LAB — ECHOCARDIOGRAM COMPLETE
AVPHT: 563 ms
CHL CUP TV REG PEAK VELOCITY: 279 cm/s
E/e' ratio: 9.72
EWDT: 180 ms
FS: 28 % (ref 28–44)
Height: 64 in
IVS/LV PW RATIO, ED: 0.98
LA vol index: 51.4 mL/m2
LADIAMINDEX: 2 cm/m2
LASIZE: 32 mm
LAVOL: 82.3 mL
LAVOLA4C: 62.2 mL
LEFT ATRIUM END SYS DIAM: 32 mm
LV E/e' medial: 9.72
LV PW d: 13.1 mm — AB (ref 0.6–1.1)
LV TDI E'MEDIAL: 9.68
LV e' LATERAL: 8.49 cm/s
LVEEAVG: 9.72
LVOT area: 2.54 cm2
LVOT diameter: 18 mm
Lateral S' vel: 14 cm/s
MV Dec: 180
MV Peak grad: 3 mmHg
MV pk E vel: 82.5 m/s
RV sys press: 39 mmHg
TAPSE: 15.9 mm
TDI e' lateral: 8.49
TRMAXVEL: 279 cm/s
Weight: 2003.54 oz

## 2016-10-21 LAB — PROTIME-INR
INR: 2.34
Prothrombin Time: 26.1 seconds — ABNORMAL HIGH (ref 11.4–15.2)

## 2016-10-21 LAB — BRAIN NATRIURETIC PEPTIDE: B NATRIURETIC PEPTIDE 5: 504.1 pg/mL — AB (ref 0.0–100.0)

## 2016-10-21 LAB — MAGNESIUM: MAGNESIUM: 2.2 mg/dL (ref 1.7–2.4)

## 2016-10-21 LAB — MRSA PCR SCREENING: MRSA BY PCR: NEGATIVE

## 2016-10-21 LAB — HEMOGLOBIN A1C
HEMOGLOBIN A1C: 5.3 % (ref 4.8–5.6)
MEAN PLASMA GLUCOSE: 105 mg/dL

## 2016-10-21 LAB — TSH: TSH: 5.13 u[IU]/mL — ABNORMAL HIGH (ref 0.350–4.500)

## 2016-10-21 LAB — PHOSPHORUS: PHOSPHORUS: 3.4 mg/dL (ref 2.5–4.6)

## 2016-10-21 MED ORDER — SODIUM CHLORIDE 0.9 % IV SOLN: 250.0000 mL | INTRAVENOUS | Status: DC | PRN

## 2016-10-21 MED ORDER — WARFARIN SODIUM 5 MG PO TABS
5.0000 mg | ORAL_TABLET | Freq: Every day | ORAL | Status: DC
  Administered 2016-10-21 – 2016-10-22 (×2): 5 mg via ORAL
  Filled 2016-10-21 (×2): qty 1

## 2016-10-21 MED ORDER — SODIUM CHLORIDE 0.9% FLUSH
3.0000 mL | Freq: Two times a day (BID) | INTRAVENOUS | Status: DC
  Administered 2016-10-21 – 2016-10-22 (×2): 3 mL via INTRAVENOUS

## 2016-10-21 MED ORDER — ONDANSETRON HCL 4 MG/2ML IJ SOLN: 4.0000 mg | Freq: Four times a day (QID) | INTRAMUSCULAR | Status: DC | PRN

## 2016-10-21 MED ORDER — GUAIFENESIN ER 600 MG PO TB12
600.0000 mg | ORAL_TABLET | Freq: Two times a day (BID) | ORAL | Status: DC
  Administered 2016-10-21 – 2016-10-22 (×4): 600 mg via ORAL
  Filled 2016-10-21 (×5): qty 1

## 2016-10-21 MED ORDER — SODIUM CHLORIDE 0.9% FLUSH: 3.0000 mL | INTRAVENOUS | Status: DC | PRN

## 2016-10-21 MED ORDER — WARFARIN - PHARMACIST DOSING INPATIENT
Freq: Every day | Status: DC
  Administered 2016-10-21 – 2016-10-22 (×2)

## 2016-10-21 MED ORDER — ONDANSETRON HCL 4 MG PO TABS
4.0000 mg | ORAL_TABLET | Freq: Four times a day (QID) | ORAL | Status: DC | PRN
Start: 2016-10-21 — End: 2016-10-23

## 2016-10-21 MED ORDER — DILTIAZEM HCL 60 MG PO TABS
60.0000 mg | ORAL_TABLET | Freq: Four times a day (QID) | ORAL | Status: AC
  Administered 2016-10-21 – 2016-10-22 (×6): 60 mg via ORAL
  Filled 2016-10-21 (×6): qty 1

## 2016-10-21 MED ORDER — ACETAMINOPHEN 325 MG PO TABS: 650.0000 mg | ORAL_TABLET | Freq: Four times a day (QID) | ORAL | Status: DC | PRN

## 2016-10-21 MED ORDER — ACETAMINOPHEN 650 MG RE SUPP
650.0000 mg | Freq: Four times a day (QID) | RECTAL | Status: DC | PRN
Start: 2016-10-21 — End: 2016-10-23

## 2016-10-21 NOTE — Progress Notes (Addendum)
ANTICOAGULATION CONSULT NOTE - Initial Consult  Pharmacy Consult for Coumadin Indication: atrial fibrillation  Allergies  Allergen Reactions  . Hydrocod Polst-Cpm Polst Er Other (See Comments)    Felt bad    Patient Measurements: Height: 5\' 4"  (162.6 cm) Weight: 125 lb (56.7 kg) IBW/kg (Calculated) : 54.7  Vital Signs: Temp: 98 F (36.7 C) (12/28 1436) Temp Source: Oral (12/28 1436) BP: 116/73 (12/28 2330) Pulse Rate: 69 (12/28 2330)  Labs:  Recent Labs  10/20/16 1438  HGB 15.4*  HCT 46.7*  PLT 171  LABPROT 27.4*  INR 2.49  CREATININE 0.91    Estimated Creatinine Clearance: 39.7 mL/min (by C-G formula based on SCr of 0.91 mg/dL).   Medical History: Past Medical History:  Diagnosis Date  . Atrial fibrillation ([place])   . Hx of colonic polyps   . Hyperlipidemia   . Hypertension   . Uterine cancer [facility])     Assessment: 82yo female c/o palpitations, CP, and SOB, found to be in Afib w/ RVR in ED, to continue Coumadin for Afib; current INR at goal w/ last dose of Coumadin taken 12/28.  Goal of Therapy:  INR 2-3   Plan:  Will continue home dose of Coumadin 5mg  po daily and monitor INR.  Wynona Neat, PharmD, BCPS  10/21/2016,12:27 AM

## 2016-10-21 NOTE — Consult Note (Signed)
Cardiology Consult    Patient ID: Sara Griffin MRN: [id number], DOB/AGE: [birth year]   Admit date: 10/20/2016 Date of Consult: 10/21/2016  Primary Physician: [name], MD Reason for Consult: Afib, CHF Primary Cardiologist: [name]  Requesting Provider: Dr. Sherral Hammers  Patient Profile    Sara Griffin is a 82 year old female with a past medical history of PAF(on Coumadin), HTN, and HLD. She presented to the ED on 10/20/16 with chest pain and AFib RVR.   History of Present Illness    Sara Griffin says that she has been feeling more tired for about a week. On 10/20/16 she developed palpitations and some transient chest pain. She was found to be in atrial fibrillation with RVR. She was started on a diltiazem gtt and her rate is better controlled now.   She has a history of PAF and sees [name] and has seen Dr. Percival Spanish in the past. It was noted in her visit with [name] that she only take the diltiazem "as needed" when she feels bad. Presumably when she is having rapid Afib.   Her last Echo was in April 2011, and her EF was normal and she had some diastolic dysfunction. Her left atrium was mildly dilated. She also had a nuclear stress test in May of this year for dyspnea on exertion. There was some inferoseptal thinning, but no ischemia.   She is chest pain free, denies palpitations.   Past Medical History   Past Medical History:  Diagnosis Date  . Atrial fibrillation ([place])   . Hx of colonic polyps   . Hyperlipidemia   . Hypertension   . Uterine cancer [facility])     Past Surgical History:  Procedure Laterality Date  . ABDOMINAL HYSTERECTOMY    . PARTIAL COLECTOMY       Allergies  Allergies  Allergen Reactions  . Hydrocod Polst-Cpm Polst Er Other (See Comments)    Felt bad    Inpatient Medications    . diltiazem  60 mg Oral Once  . guaiFENesin  600 mg Oral BID  . sodium chloride flush  3 mL Intravenous Q12H  . sodium chloride flush  3 mL Intravenous Q12H  .  warfarin  5 mg Oral q1800  . Warfarin - Pharmacist Dosing Inpatient   Does not apply q1800    Family History    Family History  Problem Relation Age of Onset  . Heart attack Father 58    died  . Heart disease      famil,y hx of  . Heart attack Son 51    died    Social History    Social History   Social History  . Marital status: Married    Spouse name: N/A  . Number of children: 5  . Years of education: N/A   Occupational History  . Not on file.   Social History Main Topics  . Smoking status: Never Smoker  . Smokeless tobacco: Never Used  . Alcohol use No  . Drug use: No  . Sexual activity: Not on file   Other Topics Concern  . Not on file   Social History Narrative  . No narrative on file     Review of Systems    General:  No chills, fever, night sweats or weight changes.  Cardiovascular:  No chest pain, + dyspnea on exertion, edema, orthopnea, palpitations, paroxysmal nocturnal dyspnea. Dermatological: No rash, lesions/masses Respiratory: No cough, dyspnea Urologic: No hematuria, dysuria Abdominal:   No nausea,  vomiting, diarrhea, bright red blood per rectum, melena, or hematemesis Neurologic:  No visual changes, wkns, changes in mental status. All other systems reviewed and are otherwise negative except as noted above.  Physical Exam    Blood pressure 118/67, pulse 73, temperature 98.1 F (36.7 C), temperature source Oral, resp. rate (!) 26, height 5\' 4"  (1.626 m), weight 125 lb 3.5 oz (56.8 kg), SpO2 93 %.  General: Elderly female  Psych: Normal affect. Neuro: Alert and oriented X 3. Moves all extremities spontaneously. HEENT: Normal  Neck: Supple without bruits or JVD. Lungs:  Resp regular and unlabored, CTA. Heart: irregularly irregular rhythm, no s3, s4, or murmurs. Abdomen: Soft, non-tender, non-distended, BS + x 4.  Extremities: No clubbing, cyanosis or edema. DP/PT/Radials 2+ and equal bilaterally.  Labs    Troponin [facility] of Care  Test)  Recent Labs  10/20/16 1520  TROPIPOC 0.02    Recent Labs  10/21/16 0126 10/21/16 0339  TROPONINI 0.03* 0.03*   Lab Results  Component Value Date   WBC 5.2 10/21/2016   HGB 15.0 10/21/2016   HCT 44.6 10/21/2016   MCV 96.3 10/21/2016   PLT 166 10/21/2016    Recent Labs Lab 10/21/16 0339  NA 140  K 3.7  CL 107  CO2 24  BUN 16  CREATININE 0.89  CALCIUM 8.7*  PROT 5.6*  BILITOT 1.9*  ALKPHOS 65  ALT 28  AST 29  GLUCOSE 90     Radiology Studies    No results found.  EKG & Cardiac Imaging    EKG: Afib RVR  Echocardiogram: pending   Assessment & Plan    1. Atrial fibrillation with RVR: Patient has a known history of PAF, and presents with symptomatic RVR. She is now more rate controlled on diltiazem gtt. She takes diltiazem at home on an "as needed basis". She will need to take this daily.   There was some mention of baseline of bradycardia, however her past 2 office visits with [name] her HR was 60 and 64.   Her EKG has anterolateral T wave inversion, this was noted on the EKG from May 2017, which is why she was sent for the stress test as mentioned above.   This patients CHA2DS2-VASc Score and unadjusted Ischemic Stroke Rate (% per year) is equal to 3.2 % stroke rate/year from a score of 3 Above score calculated as 1 point each if present [CHF, HTN, DM, Vascular=MI/PAD/Aortic Plaque, Age if 65-74, or Female], 2 points each if present [Age > 75, or Stroke/TIA/TE]   She wants to remain on Coumadin, not interested in starting a NOAC.   We will follow the results of her Echo, if her EF is reduced, will need an ischemic evaluation.    Signed, Arbutus Leas, NP 10/21/2016, 1:12 PM Pager: [phone]  ------------------------------------------------------------------- 10/21/16 ECHO Study Conclusions  - Left ventricle: The cavity size was normal. Wall thickness was   increased in a pattern of mild LVH. Indeterminant diastolic   function (atrial  fibrillation). Systolic function was normal. The   estimated ejection fraction was in the range of 60% to 65%. Wall   motion was normal; there were no regional wall motion   abnormalities. - Aortic valve: There was no stenosis. There was trivial   regurgitation. - Mitral valve: Mildly calcified annulus. There was trivial   regurgitation. - Left atrium: The atrium was moderately to severely dilated. - Right ventricle: The cavity size was mildly dilated. Systolic   function was normal. -  Right atrium: The atrium was mildly dilated. - Tricuspid valve: Peak RV-RA gradient (S): 31 mm Hg. - Pulmonary arteries: PA peak pressure: 39 mm Hg (S). - Systemic veins: IVC measured 1.9 cm with < 50% respirophasic   variation, suggesting RA pressure 8 mmHg.  Impressions:  - The patient was in atrial fibrillation. Normal LV size with mild   LV hypertrophy. EF 60-65%. Mildly dilated RV with normal systolic   function. Biatrial enlargement. Mild pulmonary hypertension.  Patient seen and examined. Agree with assessment and plan.Sara Griffin is a very pleasant 54-year-old female patient who is recently been followed by [name].  She has a history of paroxysmal atrial fibrillation, hyperlipidemia, and hypertension.  In May 2017 she experienced symptoms with dyspnea on exertion and a nuclear stress test was negative for ischemia.  She last saw [name] in August 2017.  She was on losartan 50 mg twice a day, Coumadin, aspirin 81 mg, and was not on any chronic rate control medication, but had a prescription for diltiazem 60 mg to take as needed for PAF.  Two nights ago, she noticed her heart racing.  She also noticed some chest fullness.  She was admitted to [facility] hospital with a AF with rapid ventricular rate yesterday.  Her ECG showed A. fib at 143 bpm with right axis deviation and a QS complex in V1 and V2.  She underwent an echo Doppler study today which shows normal systolic function with an EF of 60-65%.  Her  RV was mildly dilated with normal function.  There was biatrial enlargement and mild pulmonary hypertension.  Specifically, her LA volume was significantly increased at 51.3 mL's per meter squared, although by the parasternal measurement was 32 mm.  At present, telemetry demonstrates atrial fibrillation with rates now in the 90s.  I will initiate beta blocker therapy with metoprolol 25 mg every 8 hours initially.  She may ultimately benefit from a pill in the pocket regimen of flecainide if she does not cardiovert.  She has been on chronic anticoagulation.  Her INR today is 2.34.  She will be seen by my colleagues in the morning for follow-up evaluation.   Troy Sine, MD, [facility] 10/21/2016 6:02 PM

## 2016-10-21 NOTE — Progress Notes (Signed)
PROGRESS NOTE    Sara Griffin  [id number] DOB: [birth year] DOA: 10/20/2016 PCP: [name], MD   Brief Narrative:  82 y.o. WF PMHx A-.Fib on Coumadin, HTN, HLD, Uterine cancer  Presented with a 24-hour history of worsening dyspnea and exertion as well as at rest with occasional sharp chest pain lasting just a few seconds at a time. Patient has known history of atrial fibrillation but not on any agent long-term secondary to propensity to develop bradycardia when she converts back to sinus rhythm. Patient states she only takes Cardizem "when she feels bad" she does not check her HR or BP prior to taking it. She is unaware of its affects or purpose. Spoke at length with patient and family to make them aware that Cardizem is ment to be taken if her HR is elevated and she has normal or  high blood pressure. She reports occasional palpitations but hasn't been felling well lately for the past 24 hours. Also reports nonproductive cough but no fevers or chills. On her presentation to emergency department patient was found to be in A. fib with RVR with heart rate up to 140s. Her cough has not resolved. She hasn't had any chest pain today, currently chest pain free. Given lower extremity edema and some shortness of breath patient was given Lasix emerge department and has diuresed her lower extremity edema now resolved   Regarding pertinent Chronic problems: in May 2017 was seen for dyspnea on exertion had nuclear stress test Was Negative for Ischemia. Patient has history of hyperlipidemia controlled by her family medicine provider not at goal total cholesterol over 300   Subjective: 12/29  A/O 4, NAD, negative CP, negative SOB. Patient states initial presenting symptoms were chest tightness. He has been up today ambulating around room without recurrent symptoms.   Assessment & Plan:   Active Problems:   Essential hypertension   Chronic atrial fibrillation (HCC)   Chest pain   Atrial  fibrillation with RVR (HCC)   A.- Fib with RVR (CHA2D-VASC score 5)/Chest pain -DC dust diltiazem drip, start Cardizem 60 mg QID -Patient desires to continue Coumadin Recent Labs Lab 10/20/16 1438 10/21/16 0339  INR 2.49 2.34  -Currently therapeutic  Dilated cardiomyopathy -See A. fib  Pulmonary hypertension -See A. fib  Essential HTN -See A. fib   DVT prophylaxis: Coumadin Code Status: Full Family Communication: None Disposition Plan: DC in a.m. if A. fib controlled   Consultants:  Cardiology    Procedures/Significant Events:  12/29 Echocardiogram: Left ventricle: mild LVH. -LVEF =60% to 65%. - Left atrium: moderately to severely dilated. - Pulmonary arteries: PA peak pressure: 39 mm Hg (S).   VENTILATOR SETTINGS: NA   Cultures NA  Antimicrobials:    Devices NA   LINES / TUBES:      Continuous Infusions: . diltiazem (CARDIZEM) infusion 5 mg/hr (10/20/16 2103)     Objective: Vitals:   10/21/16 0352 10/21/16 0830 10/21/16 1234 10/21/16 1654  BP: (!) 82/74 140/80 118/67 (!) 139/95  Pulse:  68 73 70  Resp: 17 15 (!) 26 (!) 22  Temp: 98.9 F (37.2 C) 97.9 F (36.6 C) 98.1 F (36.7 C) 98 F (36.7 C)  TempSrc: Oral Oral Oral Oral  SpO2: 95% 97% 93% 94%  Weight: 56.8 kg (125 lb 3.5 oz)     Height:        Intake/Output Summary (Last 24 hours) at 10/21/16 1738 Last data filed at 10/21/16 0300  Gross per 24 hour  Intake  29.75 ml  Output              400 ml  Net          -370.25 ml   Filed Weights   10/21/16 0000 10/21/16 0158 10/21/16 0352  Weight: 56.7 kg (125 lb) 56.9 kg (125 lb 7.1 oz) 56.8 kg (125 lb 3.5 oz)    Examination:  General: A/O 4, NAD, No acute respiratory distress Eyes: negative scleral hemorrhage, negative anisocoria, negative icterus ENT: Negative Runny nose, negative gingival bleeding, Neck:  Negative scars, masses, torticollis, lymphadenopathy, JVD Lungs: Clear to auscultation bilaterally without  wheezes or crackles Cardiovascular: Irregular irregular rhythm and rate, without murmur gallop or rub normal S1 and S2 Abdomen: negative abdominal pain, nondistended, positive soft, bowel sounds, no rebound, no ascites, no appreciable mass Extremities: No significant cyanosis, clubbing, or edema bilateral lower extremities Skin: Negative rashes, lesions, ulcers Psychiatric:  Negative depression, negative anxiety, negative fatigue, negative mania  Central nervous system:  Cranial nerves II through XII intact, tongue/uvula midline, all extremities muscle strength 5/5, sensation intact throughout, negative dysarthria, negative expressive aphasia, negative receptive aphasia.  .     Data Reviewed: Care during the described time interval was provided by me .  I have reviewed this patient's available data, including medical history, events of note, physical examination, and all test results as part of my evaluation. I have personally reviewed and interpreted all radiology studies.  CBC:  Recent Labs Lab 10/20/16 1438 10/21/16 0339  WBC 6.0 5.2  HGB 15.4* 15.0  HCT 46.7* 44.6  MCV 96.7 96.3  PLT 171 [id number]   Basic Metabolic Panel:  Recent Labs Lab 10/20/16 1438 10/21/16 0339  NA 140 140  K 4.3 3.7  CL 108 107  CO2 22 24  GLUCOSE 94 90  BUN 17 16  CREATININE 0.91 0.89  CALCIUM 9.1 8.7*  MG  --  2.2  PHOS  --  3.4   GFR: Estimated Creatinine Clearance: 40.6 mL/min (by C-G formula based on SCr of 0.89 mg/dL). Liver Function Tests:  Recent Labs Lab 10/21/16 0339  AST 29  ALT 28  ALKPHOS 65  BILITOT 1.9*  PROT 5.6*  ALBUMIN 3.4*   No results for input(s): LIPASE, AMYLASE in the last 168 hours. No results for input(s): AMMONIA in the last 168 hours. Coagulation Profile:  Recent Labs Lab 10/20/16 1438 10/21/16 0339  INR 2.49 2.34   Cardiac Enzymes:  Recent Labs Lab 10/21/16 0126 10/21/16 0339 10/21/16 1247  TROPONINI 0.03* 0.03* <0.03   BNP (last 3  results) No results for input(s): PROBNP in the last 8760 hours. HbA1C: No results for input(s): HGBA1C in the last 72 hours. CBG: No results for input(s): GLUCAP in the last 168 hours. Lipid Profile: No results for input(s): CHOL, HDL, LDLCALC, TRIG, CHOLHDL, LDLDIRECT in the last 72 hours. Thyroid Function Tests:  Recent Labs  10/21/16 0339  TSH 5.130*   Anemia Panel: No results for input(s): VITAMINB12, FOLATE, FERRITIN, TIBC, IRON, RETICCTPCT in the last 72 hours. Urine analysis:    Component Value Date/Time   COLORURINE COLORLESS (A) 10/20/2016 2100   APPEARANCEUR CLEAR 10/20/2016 2100   LABSPEC 1.004 (L) 10/20/2016 2100   PHURINE 5.0 10/20/2016 2100   GLUCOSEU NEGATIVE 10/20/2016 2100   HGBUR SMALL (A) 10/20/2016 2100   BILIRUBINUR NEGATIVE 10/20/2016 2100   KETONESUR NEGATIVE 10/20/2016 2100   PROTEINUR NEGATIVE 10/20/2016 2100   NITRITE NEGATIVE 10/20/2016 2100   LEUKOCYTESUR NEGATIVE 10/20/2016 2100   Sepsis  Labs: @LABRCNTIP (procalcitonin:4,lacticidven:4)  ) Recent Results (from the past 240 hour(s))  MRSA PCR Screening     Status: None   Collection Time: 10/21/16  2:29 AM  Result Value Ref Range Status   MRSA by PCR NEGATIVE NEGATIVE Final    Comment:        The GeneXpert MRSA Assay (FDA approved for NASAL specimens only), is one component of a comprehensive MRSA colonization surveillance program. It is not intended to diagnose MRSA infection nor to guide or monitor treatment for MRSA infections.          Radiology Studies: No results found.      Scheduled Meds: . diltiazem  60 mg Oral Once  . guaiFENesin  600 mg Oral BID  . sodium chloride flush  3 mL Intravenous Q12H  . sodium chloride flush  3 mL Intravenous Q12H  . warfarin  5 mg Oral q1800  . Warfarin - Pharmacist Dosing Inpatient   Does not apply q1800   Continuous Infusions: . diltiazem (CARDIZEM) infusion 5 mg/hr (10/20/16 2103)     LOS: 1 day    Time spent: 40  minutes    WOODS, Geraldo Docker, MD [facility] Pager [phone]   If 7PM-7AM, please contact night-coverage [email or web address] Password [facility] 10/21/2016, 5:38 PM

## 2016-10-21 NOTE — Discharge Instructions (Signed)

## 2016-10-22 ENCOUNTER — Encounter (HOSPITAL_COMMUNITY): Payer: Self-pay

## 2016-10-22 ENCOUNTER — Inpatient Hospital Stay (HOSPITAL_COMMUNITY): Payer: Medicare Other

## 2016-10-22 DIAGNOSIS — I481 Persistent atrial fibrillation: Secondary | ICD-10-CM

## 2016-10-22 LAB — BASIC METABOLIC PANEL
Anion gap: 7 (ref 5–15)
BUN: 23 mg/dL — AB (ref 6–20)
CHLORIDE: 107 mmol/L (ref 101–111)
CO2: 25 mmol/L (ref 22–32)
CREATININE: 0.96 mg/dL (ref 0.44–1.00)
Calcium: 8.7 mg/dL — ABNORMAL LOW (ref 8.9–10.3)
GFR, EST NON AFRICAN AMERICAN: 53 mL/min — AB (ref >60)
Glucose, Bld: 85 mg/dL (ref 65–99)
POTASSIUM: 4.1 mmol/L (ref 3.5–5.1)
SODIUM: 139 mmol/L (ref 135–145)

## 2016-10-22 LAB — PROTIME-INR
INR: 2.38
PROTHROMBIN TIME: 26.4 s — AB (ref 11.4–15.2)

## 2016-10-22 LAB — T4, FREE: Free T4: 0.82 ng/dL (ref 0.61–1.12)

## 2016-10-22 MED ORDER — DILTIAZEM HCL ER COATED BEADS 240 MG PO CP24
240.0000 mg | ORAL_CAPSULE | Freq: Every day | ORAL | Status: DC
  Administered 2016-10-23: 240 mg via ORAL
  Filled 2016-10-22: qty 1

## 2016-10-22 NOTE — Progress Notes (Signed)
PROGRESS NOTE    Sara Griffin  [id number] DOB: 06-21-1930 DOA: 10/20/2016 PCP: [name], MD   Brief Narrative:  82 y.o. WF PMHx A-.Fib on Coumadin, HTN, HLD, Uterine cancer  Presented with a 24-hour history of worsening dyspnea and exertion as well as at rest with occasional sharp chest pain lasting just a few seconds at a time. Patient has known history of atrial fibrillation but not on any agent long-term secondary to propensity to develop bradycardia when she converts back to sinus rhythm. Patient states she only takes Cardizem "when she feels bad" she does not check her HR or BP prior to taking it. She is unaware of its affects or purpose. Spoke at length with patient and family to make them aware that Cardizem is ment to be taken if her HR is elevated and she has normal or  high blood pressure. She reports occasional palpitations but hasn't been felling well lately for the past 24 hours. Also reports nonproductive cough but no fevers or chills. On her presentation to emergency department patient was found to be in A. fib with RVR with heart rate up to 140s. Her cough has not resolved. She hasn't had any chest pain today, currently chest pain free. Given lower extremity edema and some shortness of breath patient was given Lasix emerge department and has diuresed her lower extremity edema now resolved   Regarding pertinent Chronic problems: in May 2017 was seen for dyspnea on exertion had nuclear stress test Was Negative for Ischemia. Patient has history of hyperlipidemia controlled by her family medicine provider not at goal total cholesterol over 300   Subjective: 12/29  A/O 4, NAD, negative CP, negative SOB. Patient ambulated around ward without DOE, CP, tachycardia.   Assessment & Plan:   Active Problems:   Essential hypertension   Chronic atrial fibrillation (HCC)   Chest pain   Atrial fibrillation with RVR (HCC)   Atrial fibrillation with rapid ventricular response  (HCC)   Congestive dilated cardiomyopathy ([place])   Pulmonary hypertension   A.- Fib with RVR (CHA2D-VASC score 5)/Chest pain -12/30 Patient stable for discharge, however cardiology requests patient stay overnight and change Cardizem 60 mg QID--> start patient on Cardizem long-acting 240 mg daily on 12/31@0600  -Continue Coumadin Recent Labs Lab 10/20/16 1438 10/21/16 0339 10/22/16 0211  INR 2.49 2.34 2.38  -Currently therapeutic  Dilated cardiomyopathy -See A. fib  Pulmonary hypertension -See A. fib  Essential HTN -See A. fib   DVT prophylaxis: Coumadin Code Status: Full Family Communication: None Disposition Plan: DC in A.m.    Consultants:  Cardiology    Procedures/Significant Events:  12/29 Echocardiogram: Left ventricle: mild LVH. -LVEF =60% to 65%. - Left atrium: moderately to severely dilated. - Pulmonary arteries: PA peak pressure: 39 mm Hg (S).   VENTILATOR SETTINGS: NA   Cultures NA  Antimicrobials:    Devices NA   LINES / TUBES:      Continuous Infusions:    Objective: Vitals:   10/22/16 0309 10/22/16 0830 10/22/16 1120 10/22/16 1616  BP: 123/75 113/85 127/67 123/66  Pulse: 86 88 74 73  Resp: 20 (!) 23 (!) 22   Temp: 98.3 F (36.8 C) 97.6 F (36.4 C) 98.1 F (36.7 C) 98.2 F (36.8 C)  TempSrc: Oral Oral Oral Oral  SpO2: 95% 96% 94% 91%  Weight: 56.8 kg (125 lb 3.2 oz)     Height:        Intake/Output Summary (Last 24 hours) at 10/22/16 1643 Last data filed  at 10/22/16 1600  Gross per 24 hour  Intake              720 ml  Output                0 ml  Net              720 ml   Filed Weights   10/21/16 0158 10/21/16 0352 10/22/16 0309  Weight: 56.9 kg (125 lb 7.1 oz) 56.8 kg (125 lb 3.5 oz) 56.8 kg (125 lb 3.2 oz)    Examination:  General: A/O 4, NAD, No acute respiratory distress Eyes: negative scleral hemorrhage, negative anisocoria, negative icterus ENT: Negative Runny nose, negative gingival bleeding, Neck:   Negative scars, masses, torticollis, lymphadenopathy, JVD Lungs: Clear to auscultation bilaterally without wheezes or crackles Cardiovascular: Irregular irregular rhythm and rate, without murmur gallop or rub normal S1 and S2 Abdomen: negative abdominal pain, nondistended, positive soft, bowel sounds, no rebound, no ascites, no appreciable mass Extremities: No significant cyanosis, clubbing, or edema bilateral lower extremities Skin: Negative rashes, lesions, ulcers Psychiatric:  Negative depression, negative anxiety, negative fatigue, negative mania  Central nervous system:  Cranial nerves II through XII intact, tongue/uvula midline, all extremities muscle strength 5/5, sensation intact throughout, negative dysarthria, negative expressive aphasia, negative receptive aphasia.  .     Data Reviewed: Care during the described time interval was provided by me .  I have reviewed this patient's available data, including medical history, events of note, physical examination, and all test results as part of my evaluation. I have personally reviewed and interpreted all radiology studies.  CBC:  Recent Labs Lab 10/20/16 1438 10/21/16 0339  WBC 6.0 5.2  HGB 15.4* 15.0  HCT 46.7* 44.6  MCV 96.7 96.3  PLT 171 [id number]   Basic Metabolic Panel:  Recent Labs Lab 10/20/16 1438 10/21/16 0339 10/22/16 0211  NA 140 140 139  K 4.3 3.7 4.1  CL 108 107 107  CO2 22 24 25   GLUCOSE 94 90 85  BUN 17 16 23*  CREATININE 0.91 0.89 0.96  CALCIUM 9.1 8.7* 8.7*  MG  --  2.2  --   PHOS  --  3.4  --    GFR: Estimated Creatinine Clearance: 37.7 mL/min (by C-G formula based on SCr of 0.96 mg/dL). Liver Function Tests:  Recent Labs Lab 10/21/16 0339  AST 29  ALT 28  ALKPHOS 65  BILITOT 1.9*  PROT 5.6*  ALBUMIN 3.4*   No results for input(s): LIPASE, AMYLASE in the last 168 hours. No results for input(s): AMMONIA in the last 168 hours. Coagulation Profile:  Recent Labs Lab 10/20/16 1438  10/21/16 0339 10/22/16 0211  INR 2.49 2.34 2.38   Cardiac Enzymes:  Recent Labs Lab 10/21/16 0126 10/21/16 0339 10/21/16 1247  TROPONINI 0.03* 0.03* <0.03   BNP (last 3 results) No results for input(s): PROBNP in the last 8760 hours. HbA1C:  Recent Labs  10/21/16 0339  HGBA1C 5.3   CBG: No results for input(s): GLUCAP in the last 168 hours. Lipid Profile: No results for input(s): CHOL, HDL, LDLCALC, TRIG, CHOLHDL, LDLDIRECT in the last 72 hours. Thyroid Function Tests:  Recent Labs  10/21/16 0339 10/22/16 1426  TSH 5.130*  --   FREET4  --  0.82   Anemia Panel: No results for input(s): VITAMINB12, FOLATE, FERRITIN, TIBC, IRON, RETICCTPCT in the last 72 hours. Urine analysis:    Component Value Date/Time   COLORURINE COLORLESS (A) 10/20/2016 2100   APPEARANCEUR  CLEAR 10/20/2016 2100   LABSPEC 1.004 (L) 10/20/2016 2100   PHURINE 5.0 10/20/2016 2100   GLUCOSEU NEGATIVE 10/20/2016 2100   HGBUR SMALL (A) 10/20/2016 2100   BILIRUBINUR NEGATIVE 10/20/2016 2100   KETONESUR NEGATIVE 10/20/2016 2100   PROTEINUR NEGATIVE 10/20/2016 2100   NITRITE NEGATIVE 10/20/2016 2100   LEUKOCYTESUR NEGATIVE 10/20/2016 2100   Sepsis Labs: @LABRCNTIP (procalcitonin:4,lacticidven:4)  ) Recent Results (from the past 240 hour(s))  MRSA PCR Screening     Status: None   Collection Time: 10/21/16  2:29 AM  Result Value Ref Range Status   MRSA by PCR NEGATIVE NEGATIVE Final    Comment:        The GeneXpert MRSA Assay (FDA approved for NASAL specimens only), is one component of a comprehensive MRSA colonization surveillance program. It is not intended to diagnose MRSA infection nor to guide or monitor treatment for MRSA infections.          Radiology Studies: No results found.      Scheduled Meds: . diltiazem  60 mg Oral Q6H  . guaiFENesin  600 mg Oral BID  . sodium chloride flush  3 mL Intravenous Q12H  . sodium chloride flush  3 mL Intravenous Q12H  . warfarin   5 mg Oral q1800  . Warfarin - Pharmacist Dosing Inpatient   Does not apply q1800   Continuous Infusions:    LOS: 2 days    Time spent: 40 minutes    WOODS, Geraldo Docker, MD [facility] Pager [phone]   If 7PM-7AM, please contact night-coverage [email or web address] Password [email or web address] 10/22/2016, 4:43 PM

## 2016-10-22 NOTE — Progress Notes (Signed)
   Subjective: Breatiing OK  No CP  No palpitatisons   Objective: Vitals:   10/22/16 0003 10/22/16 0309 10/22/16 0830 10/22/16 1120  BP: 114/68 123/75 113/85 127/67  Pulse:  86 88 74  Resp:  20 (!) 23 (!) 22  Temp:  98.3 F (36.8 C) 97.6 F (36.4 C) 98.1 F (36.7 C)  TempSrc:  Oral Oral Oral  SpO2:  95% 96% 94%  Weight:  125 lb 3.2 oz (56.8 kg)    Height:       Weight change: 3.2 oz (0.09 kg)  Intake/Output Summary (Last 24 hours) at 10/22/16 1329 Last data filed at 10/22/16 0934  Gross per 24 hour  Intake              480 ml  Output                0 ml  Net              480 ml    General: Alert, awake, oriented x3, in no acute distress Neck:  JVP is normal Heart: Regular rate and rhythm, without murmurs, rubs, gallops.  Lungs: Clear to auscultation.  No rales or wheezes. Exemities:  No edema.   Neuro: Grossly intact, nonfocal.  Tele  Afib  80s    Lab Results: Results for orders placed or performed during the hospital encounter of 10/20/16 (from the past 24 hour(s))  Basic metabolic panel     Status: Abnormal   Collection Time: 10/22/16  2:11 AM  Result Value Ref Range   Sodium 139 135 - 145 mmol/L   Potassium 4.1 3.5 - 5.1 mmol/L   Chloride 107 101 - 111 mmol/L   CO2 25 22 - 32 mmol/L   Glucose, Bld 85 65 - 99 mg/dL   BUN 23 (H) 6 - 20 mg/dL   Creatinine, Ser 0.96 0.44 - 1.00 mg/dL   Calcium 8.7 (L) 8.9 - 10.3 mg/dL   GFR calc non Af Amer 53 (L) >60 mL/min   GFR calc Af Amer >60 >60 mL/min   Anion gap 7 5 - 15  Protime-INR     Status: Abnormal   Collection Time: 10/22/16  2:11 AM  Result Value Ref Range   Prothrombin Time 26.4 (H) 11.4 - 15.2 seconds   INR 2.38     Studies/Results: No results found.  Medications: REviewed    @PROBHOSP @  1  Persistent atrial fib    Rates are good on current regimen of dilt 60 q 6  Not too slow    Keep on coumadin    Check thryoid   WOuld continue dilt today q 6 hours Switch to long acting tomorrow If comfortable  and HR not too fast d/c tomorrow   Would plan rate control then with anticoaguation  Echo shows significant atrial enlargement  Chance of maintaining SR without antiarrhythmic low  Would not use flecanide with age     LOS: 2 days   Sara Griffin 10/22/2016, 1:29 PM

## 2016-10-22 NOTE — Progress Notes (Signed)
Patient ambulated in hall way with RN for 200 yards HR never got above 116 and O2 level stayed above 93% the entire walk.

## 2016-10-22 NOTE — Progress Notes (Signed)
ANTICOAGULATION CONSULT NOTE - Follow Up Consult  Pharmacy Consult for Coumadin Indication: atrial fibrillation  Allergies  Allergen Reactions  . Hydrocod Polst-Cpm Polst Er Other (See Comments)    Felt bad    Patient Measurements: Height: 5\' 4"  (162.6 cm) Weight: 125 lb 3.2 oz (56.8 kg) IBW/kg (Calculated) : 54.7  Vital Signs: Temp: 97.6 F (36.4 C) (12/30 0830) Temp Source: Oral (12/30 0830) BP: 113/85 (12/30 0830) Pulse Rate: 88 (12/30 0830)  Labs:  Recent Labs  10/20/16 1438 10/21/16 0126 10/21/16 0339 10/21/16 1247 10/22/16 0211  HGB 15.4*  --  15.0  --   --   HCT 46.7*  --  44.6  --   --   PLT 171  --  166  --   --   LABPROT 27.4*  --  26.1*  --  26.4*  INR 2.49  --  2.34  --  2.38  CREATININE 0.91  --  0.89  --  0.96  TROPONINI  --  0.03* 0.03* <0.03  --     Estimated Creatinine Clearance: 37.7 mL/min (by C-G formula based on SCr of 0.96 mg/dL).  Assessment:  82yo female c/o palpitations, CP, and SOB, found to be in Afib w/ RVR in ED, to continue Coumadin for Afib. INR at goal. No issues noted. PTA warfarin 5mg  daily.   Goal of Therapy:  INR 2-3 Monitor platelets by anticoagulation protocol: Yes   Plan:  Warfarin 5mg  po daily Monitor INR  Angela Burke, PharmD, BCPS Pharmacy Resident Pager: [phone] 10/22/2016,10:04 AM

## 2016-10-23 LAB — BASIC METABOLIC PANEL
Anion gap: 7 (ref 5–15)
BUN: 23 mg/dL — AB (ref 6–20)
CHLORIDE: 107 mmol/L (ref 101–111)
CO2: 26 mmol/L (ref 22–32)
CREATININE: 1.1 mg/dL — AB (ref 0.44–1.00)
Calcium: 8.9 mg/dL (ref 8.9–10.3)
GFR calc Af Amer: 52 mL/min — ABNORMAL LOW (ref >60)
GFR, EST NON AFRICAN AMERICAN: 45 mL/min — AB (ref >60)
GLUCOSE: 93 mg/dL (ref 65–99)
Potassium: 5.8 mmol/L — ABNORMAL HIGH (ref 3.5–5.1)
SODIUM: 140 mmol/L (ref 135–145)

## 2016-10-23 LAB — POTASSIUM: Potassium: 4.1 mmol/L (ref 3.5–5.1)

## 2016-10-23 LAB — PROTIME-INR
INR: 2.7
PROTHROMBIN TIME: 29.2 s — AB (ref 11.4–15.2)

## 2016-10-23 LAB — T3, FREE: T3 FREE: 2.4 pg/mL (ref 2.0–4.4)

## 2016-10-23 MED ORDER — DILTIAZEM HCL ER COATED BEADS 240 MG PO CP24: 240.0000 mg | ORAL_CAPSULE | Freq: Every day | ORAL | 0 refills | Status: DC

## 2016-10-23 NOTE — Discharge Summary (Addendum)
Physician Discharge Summary  Sara Griffin [id number] DOB: [birth year] DOA: 10/20/2016  PCP: Sara Lower, MD  Admit date: 10/20/2016 Discharge date: 10/23/2016  Time spent:35 minutes  Recommendations for Outpatient Follow-up:  A.- Fib with RVR(CHA2D-VASC score 5)/Chest pain -12/30 Patient stable for discharge, however cardiology requests patient stay overnight and change Cardizem 60 mg QID--> start patient on Cardizem long-acting 240 mg daily on 12/31@0600  -Continue Coumadin  Recent Labs Lab 10/20/16 1438 10/21/16 0339 10/22/16 0211 10/23/16 0308  INR 2.49 2.34 2.38 2.70  -schedule Follow-up in one week with Dr. Minus Breeding Cardiology, follow-up A. fib RVR (INR checked), dilated cardiomyopathy, HTN  Dilated cardiomyopathy -See A. fib  Pulmonary hypertension -See A. fib  Essential HTN -See A. Fib  Lung Nodule -12/20 PCXR shows Possible nodule in the left mid lung zone. -Schedule Followup with Dr Sara Griffin in 1 week; recommend obtain Chest CT      Discharge Diagnoses:  Active Problems:   Essential hypertension   Chronic atrial fibrillation (HCC)   Chest pain   Atrial fibrillation with RVR (HCC)   Atrial fibrillation with rapid ventricular response (HCC)   Congestive dilated cardiomyopathy [facility])   Pulmonary hypertension   Discharge Condition: Stable  Diet recommendation: Heart healthy  Filed Weights   10/21/16 0352 10/22/16 0309 10/23/16 0300  Weight: 56.8 kg (125 lb 3.5 oz) 56.8 kg (125 lb 3.2 oz) 56 kg (123 lb 8 oz)    History of present illness:  82 y.o.WF PMHx A-.Fib on Coumadin, HTN, HLD, Uterine cancer  Presented with a 24-hour history of worsening dyspnea and exertion as well as at rest with occasional sharp chest pain lasting just a few seconds at a time. Patient has known history of atrial fibrillation but not on any agent long-term secondary to propensity to develop bradycardia when she converts back to sinus rhythm. Patient  states she only takes Cardizem "when she feels bad" she does not check her HR or BP prior to taking it. She is unaware of its affects or purpose. Spoke at length with patient and family to make them aware that Cardizem is ment to be taken if her HR is elevated and she has normal or high blood pressure. She reports occasional palpitations but hasn't been felling well lately for the past 24 hours. Also reports nonproductive cough but no fevers or chills. On her presentation to emergency department patient was found to be in A. fib with RVR with heart rate up to 140s. Her cough has not resolved. She hasn't had any chest pain today, currently chest pain free. Given Griffin extremity edema and some shortness of breath patient was given Lasix emerge department and has diuresed her Griffin extremity edema now resolved  Regarding pertinent Chronic problems: in May 2017 was seen for dyspnea on exertion had nuclear stress test Was Negative for Ischemia. Patient has history of hyperlipidemia controlled by her family medicine provider not at goal total cholesterol over 300 During his hospitalization patient was treated for A. fib with RVR. Patient now on long-acting Cardizem per cardiology's request stable for discharge.   Consultants:  Cardiology    Procedures/Significant Events:  12/29 Echocardiogram: Left ventricle: mild LVH. -LVEF =60% to 65%. - Left atrium: moderately to severely dilated. - Pulmonary arteries: PA peak pressure: 39 mm Hg (S). 12/30 PCXR:-Possible nodule in the left mid lung zone.   Discharge Exam: Vitals:   10/22/16 2308 10/22/16 2346 10/23/16 0300 10/23/16 0619  BP: (!) 153/69 (!) 115/94 (!) 149/93 115/77  Pulse: 74  87   Resp: (!) 22  (!) 21   Temp: 98.5 F (36.9 C)  97.8 F (36.6 C)   TempSrc: Oral  Oral   SpO2: 91%  95%   Weight:   56 kg (123 lb 8 oz)   Height:        General: A/O 4, NAD, No acute respiratory distress Eyes: negative scleral hemorrhage, negative  anisocoria, negative icterus ENT: Negative Runny nose, negative gingival bleeding, Neck:  Negative scars, masses, torticollis, lymphadenopathy, JVD Lungs: Clear to auscultation bilaterally without wheezes or crackles Cardiovascular: Irregular irregular rhythm and rate, without murmur gallop or rub normal S1 and S2  Discharge Instructions   Allergies as of 10/23/2016      Reactions   Hydrocod Polst-cpm Polst Er Other (See Comments)   Felt bad      Medication List    STOP taking these medications   diltiazem 60 MG tablet Commonly known as:  CARDIZEM   losartan 25 MG tablet Commonly known as:  COZAAR     TAKE these medications   diltiazem 240 MG 24 hr capsule Commonly known as:  CARDIZEM CD Take 1 capsule (240 mg total) by mouth daily. Start taking on:  10/24/2016   JANTOVEN 5 MG tablet Generic drug:  warfarin Take 5 mg by mouth daily.      Allergies  Allergen Reactions  . Hydrocod Polst-Cpm Polst Er Other (See Comments)    Felt bad   Follow-up Information    Minus Breeding, MD. Schedule an appointment as soon as possible for a visit in 1 week(s).   Specialty:  Cardiology Why:  schedule Follow-up in one week with Dr. Minus Breeding Cardiology, follow-up A. fib RVR (INR checked), dilated cardiomyopathy, HTN Contact information: [address] [place] [address] [phone]            The results of significant diagnostics from this hospitalization (including imaging, microbiology, ancillary and laboratory) are listed below for reference.    Significant Diagnostic Studies: Dg Chest Port 1 View  Result Date: 10/22/2016 CLINICAL DATA:  Shortness of breath. EXAM: PORTABLE CHEST 1 VIEW COMPARISON:  None FINDINGS: There is cardiomegaly with tortuosity and calcification of the thoracic aorta. Chronic pleural calcifications at the right lung base. Vague ovoid density in the left midzone could represent a pleural plaque or artifact are nodule in the  lung. This was not visible on the scout image of the CT scan dated 03/13/2012. The lungs are otherwise clear. IMPRESSION: Possible nodule in the left mid lung zone. Chronic cardiomegaly. Aortic atherosclerosis. Electronically Signed   By: Lorriane Shire M.D.   On: 10/22/2016 21:23    Microbiology: Recent Results (from the past 240 hour(s))  MRSA PCR Screening     Status: None   Collection Time: 10/21/16  2:29 AM  Result Value Ref Range Status   MRSA by PCR NEGATIVE NEGATIVE Final    Comment:        The GeneXpert MRSA Assay (FDA approved for NASAL specimens only), is one component of a comprehensive MRSA colonization surveillance program. It is not intended to diagnose MRSA infection nor to guide or monitor treatment for MRSA infections.      Labs: Basic Metabolic Panel:  Recent Labs Lab 10/20/16 1438 10/21/16 0339 10/22/16 0211 10/23/16 0308 10/23/16 0654  NA 140 140 139 140  --   K 4.3 3.7 4.1 5.8* 4.1  CL 108 107 107 107  --   CO2 22 24 25 26   --  GLUCOSE 94 90 85 93  --   BUN 17 16 23* 23*  --   CREATININE 0.91 0.89 0.96 1.10*  --   CALCIUM 9.1 8.7* 8.7* 8.9  --   MG  --  2.2  --   --   --   PHOS  --  3.4  --   --   --    Liver Function Tests:  Recent Labs Lab 10/21/16 0339  AST 29  ALT 28  ALKPHOS 65  BILITOT 1.9*  PROT 5.6*  ALBUMIN 3.4*   No results for input(s): LIPASE, AMYLASE in the last 168 hours. No results for input(s): AMMONIA in the last 168 hours. CBC:  Recent Labs Lab 10/20/16 1438 10/21/16 0339  WBC 6.0 5.2  HGB 15.4* 15.0  HCT 46.7* 44.6  MCV 96.7 96.3  PLT 171 166   Cardiac Enzymes:  Recent Labs Lab 10/21/16 0126 10/21/16 0339 10/21/16 1247  TROPONINI 0.03* 0.03* <0.03   BNP: BNP (last 3 results)  Recent Labs  10/20/16 1438 10/21/16 0339  BNP 530.7* 504.1*    ProBNP (last 3 results) No results for input(s): PROBNP in the last 8760 hours.  CBG: No results for input(s): GLUCAP in the last 168  hours.     Signed:  Dia Crawford, MD [facility] [phone] pager

## 2016-10-23 NOTE — Progress Notes (Signed)
ANTICOAGULATION CONSULT NOTE - Follow Up Consult  Pharmacy Consult for Coumadin Indication: atrial fibrillation  Allergies  Allergen Reactions  . Hydrocod Polst-Cpm Polst Er Other (See Comments)    Felt bad    Patient Measurements: Height: 5\' 4"  (162.6 cm) Weight: 123 lb 8 oz (56 kg) IBW/kg (Calculated) : 54.7  Vital Signs: Temp: 98.1 F (36.7 C) (12/31 0907) Temp Source: Oral (12/31 0907) BP: 142/79 (12/31 0907) Pulse Rate: 86 (12/31 0907)  Labs:  Recent Labs  10/20/16 1438 10/21/16 0126 10/21/16 0339 10/21/16 1247 10/22/16 0211 10/23/16 0308  HGB 15.4*  --  15.0  --   --   --   HCT 46.7*  --  44.6  --   --   --   PLT 171  --  166  --   --   --   LABPROT 27.4*  --  26.1*  --  26.4* 29.2*  INR 2.49  --  2.34  --  2.38 2.70  CREATININE 0.91  --  0.89  --  0.96 1.10*  TROPONINI  --  0.03* 0.03* <0.03  --   --     Estimated Creatinine Clearance: 32.9 mL/min (by C-G formula based on SCr of 1.1 mg/dL (H)).  Assessment:  82yo female c/o palpitations, CP, and SOB, found to be in Afib w/ RVR in ED, to continue Coumadin for Afib. INR at goal. No issues noted. PTA warfarin 5mg  daily.   Goal of Therapy:  INR 2-3 Monitor platelets by anticoagulation protocol: Yes   Plan:  Warfarin 5mg  po daily Monitor INR, CBC, and for S&S of bleed  Angela Burke, PharmD, BCPS Pharmacy Resident Pager: [phone] 10/23/2016,9:30 AM

## 2016-10-26 DIAGNOSIS — R911 Solitary pulmonary nodule: Secondary | ICD-10-CM | POA: Insufficient documentation

## 2016-11-02 NOTE — Progress Notes (Signed)
Cardiology Office Note    Date:  11/03/2016   ID:  Sara Griffin, DOB Mar 17, 1930, MRN [id number]  PCP:  [name], MD  Cardiologist:  Dr. Rocky Crafts  CC: post hospital follow up.   History of Present Illness:  Sara Griffin is a 83 y.o. female with a history of PAF on Coumadin, HLD, HTN and carotid artery stenosis who presents to clinic for post hospital follow-up.  She has a history of PAF and was maintained on PRN diltiazem due to baseline sinus bradycardia. In May 2017 she experienced symptoms with dyspnea on exertion and a nuclear stress test was negative for ischemia.  She was recently admitted 12/28-12/31/17 for atrial fibrillation with RVR. She was started on a dilt gtt that was later transitioned to PO. Her heart rate was well-controlled with Cardizem 240 mg daily. 2-D echo showed normal EF, mild pulm HTN and severely dilated left atrium. Rate control strategy  was recommended. She was continued on Coumadin. Chest x-ray showed possible nodule in left lung zone and CT of the chest was recommended.  Today she was asked to clinic for follow-up. Feeling pretty good since leaving the hospital. Sometimes gets some mild SOB but only once since leaving the hospital. No chest pain. No LE edema, orthopnea or PND. No dizziness or syncope. No blood in stool or urine. Sometimes feels some short palpitations.    Past Medical History:  Diagnosis Date  . Atrial fibrillation ([place])   . Hx of colonic polyps   . Hyperlipidemia   . Hypertension   . Uterine cancer [facility])     Past Surgical History:  Procedure Laterality Date  . ABDOMINAL HYSTERECTOMY    . PARTIAL COLECTOMY      Current Medications: Outpatient Medications Prior to Visit  Medication Sig Dispense Refill  . diltiazem (CARDIZEM CD) 240 MG 24 hr capsule Take 1 capsule (240 mg total) by mouth daily. 30 capsule 0  . warfarin (JANTOVEN) 5 MG tablet Take 5 mg by mouth daily.     No facility-administered medications prior to  visit.      Allergies:   Hydrocod polst-cpm polst er   Social History   Social History  . Marital status: Married    Spouse name: N/A  . Number of children: 5  . Years of education: N/A   Social History Main Topics  . Smoking status: Never Smoker  . Smokeless tobacco: Never Used  . Alcohol use No  . Drug use: No  . Sexual activity: Not Asked   Other Topics Concern  . None   Social History Narrative  . None     Family History:  The patient's family history includes Heart attack (age of onset: 22) in her son; Heart attack (age of onset: 65) in her father.      ROS:   Please see the history of present illness.    ROS All other systems reviewed and are negative.   PHYSICAL EXAM:   VS:  BP 140/90   Pulse 90   Ht 5\' 4"  (1.626 m)   Wt 128 lb (58.1 kg)   BMI 21.97 kg/m    GEN: Well nourished, well developed, in no acute distress  HEENT: normal  Neck: no JVD, carotid bruits, or masses Cardiac: irreg irreg; no murmurs, rubs, or gallops,no edema  Respiratory:  clear to auscultation bilaterally, normal work of breathing GI: soft, nontender, nondistended, + BS MS: no deformity or atrophy  Skin: warm and dry, no rash Neuro:  Alert and Oriented x 3, Strength and sensation are intact Psych: euthymic mood, full affect   Wt Readings from Last 3 Encounters:  11/03/16 128 lb (58.1 kg)  10/23/16 123 lb 8 oz (56 kg)  06/10/16 125 lb (56.7 kg)      Studies/Labs Reviewed:   EKG:  EKG is ordered today.  The ekg ordered today demonstrates atrial fib with CVR. HR 90  Recent Labs: 10/21/2016: ALT 28; B Natriuretic Peptide 504.1; Hemoglobin 15.0; Magnesium 2.2; Platelets 166; TSH 5.130 10/23/2016: BUN 23; Creatinine, Ser 1.10; Potassium 4.1; Sodium 140   Lipid Panel No results found for: CHOL, TRIG, HDL, CHOLHDL, VLDL, LDLCALC, LDLDIRECT  Additional studies/ records that were reviewed today include:  10/21/16 ECHO Study Conclusions - Left ventricle: The cavity size was  normal. Wall thickness was increased in a pattern of mild LVH. Indeterminant diastolic function (atrial fibrillation). Systolic function was normal. The estimated ejection fraction was in the range of 60% to 65%. Wall motion was normal; there were no regional wall motion abnormalities. - Aortic valve: There was no stenosis. There was trivial regurgitation. - Mitral valve: Mildly calcified annulus. There was trivial regurgitation. - Left atrium: The atrium was moderately to severely dilated. - Right ventricle: The cavity size was mildly dilated. Systolic function was normal. - Right atrium: The atrium was mildly dilated. - Tricuspid valve: Peak RV-RA gradient (S): 31 mm Hg. - Pulmonary arteries: PA peak pressure: 39 mm Hg (S). - Systemic veins: IVC measured 1.9 cm with < 50% respirophasic variation, suggesting RA pressure 8 mmHg. Impressions: - The patient was in atrial fibrillation. Normal LV size with mild LV hypertrophy. EF 60-65%. Mildly dilated RV with normal systolic function. Biatrial enlargement. Mild pulmonary hypertension.   ASSESSMENT & PLAN:   Persistent atrial fibrillation: heart rate well controlled on Cardizem DC 240mg  daily. Continue coumadin for CHADSVASC of at least 4 (age, HTN, f sex). Dr. Garlon Griffin in [place] usually follows her INR. She asks if we can do this for her today. INR checked and 4.1. She has been told to hold coumadin.  Will fax over to PCP. They will need to be contacted by their coumadin clinic in next few days  HTN: BP well controlled on Cardizem 240mg  daily.   Possible pulmonary nodule: CT recently done in [place] that showed no mass. No further work up.   Carotid artery disease: 0-39% on dopplers in 2014. No ASA given coumadin use.   Medication Adjustments/Labs and Tests Ordered: Current medicines are reviewed at length with the patient today.  Concerns regarding medicines are outlined above.  Medication changes, Labs and  Tests ordered today are listed in the Patient Instructions below. Patient Instructions  Medication Instructions:  Your physician recommends that you continue on your current medications as directed. Please refer to the Current Medication list given to you today.   Labwork: None ordered  Testing/Procedures: None ordered  Follow-Up: Your physician recommends that you schedule a follow-up appointment in: 3 [place]  Any Other Special Instructions Will Be Listed Below (If Applicable).   If you need a refill on your cardiac medications before your next appointment, please call your pharmacy.      Signed, Angelena Form, PA-C  11/03/2016 9:58 AM    [facility] [place], [place], [place]  [address] Phone: [phone]; Fax: [phone]

## 2016-11-03 ENCOUNTER — Encounter: Payer: Self-pay | Admitting: Physician Assistant

## 2016-11-03 ENCOUNTER — Ambulatory Visit (INDEPENDENT_AMBULATORY_CARE_PROVIDER_SITE_OTHER): Payer: Medicare Other | Admitting: Physician Assistant

## 2016-11-03 VITALS — BP 140/90 | HR 90 | Ht 64.0 in | Wt 128.0 lb

## 2016-11-03 DIAGNOSIS — I10 Essential (primary) hypertension: Secondary | ICD-10-CM | POA: Diagnosis not present

## 2016-11-03 DIAGNOSIS — I481 Persistent atrial fibrillation: Secondary | ICD-10-CM

## 2016-11-03 DIAGNOSIS — I779 Disorder of arteries and arterioles, unspecified: Secondary | ICD-10-CM

## 2016-11-03 DIAGNOSIS — Z7901 Long term (current) use of anticoagulants: Secondary | ICD-10-CM

## 2016-11-03 DIAGNOSIS — I739 Peripheral vascular disease, unspecified: Secondary | ICD-10-CM

## 2016-11-03 DIAGNOSIS — I4819 Other persistent atrial fibrillation: Secondary | ICD-10-CM

## 2016-11-03 NOTE — Patient Instructions (Addendum)
Medication Instructions:  Your physician recommends that you continue on your current medications as directed. Please refer to the Current Medication list given to you today.   Labwork: None ordered  Testing/Procedures: None ordered  Follow-Up: Your physician recommends that you schedule a follow-up appointment in: 3 MONTHS WITH DR. NELSON  Any Other Special Instructions Will Be Listed Below (If Applicable).     If you need a refill on your cardiac medications before your next appointment, please call your pharmacy.   

## 2016-11-11 DIAGNOSIS — C55 Malignant neoplasm of uterus, part unspecified: Secondary | ICD-10-CM | POA: Insufficient documentation

## 2016-11-16 ENCOUNTER — Telehealth: Payer: Self-pay | Admitting: Cardiology

## 2016-11-16 ENCOUNTER — Other Ambulatory Visit: Payer: Self-pay | Admitting: Cardiology

## 2016-11-16 DIAGNOSIS — I10 Essential (primary) hypertension: Secondary | ICD-10-CM

## 2016-11-16 MED ORDER — DILTIAZEM HCL ER COATED BEADS 360 MG PO CP24: 360.0000 mg | ORAL_CAPSULE | Freq: Every day | ORAL | 0 refills | Status: DC

## 2016-11-16 NOTE — Telephone Encounter (Signed)
Please prescribe Cardizem CD 360 mg po daily and have her see a PA in 1-2 weeks for follow up.

## 2016-11-16 NOTE — Telephone Encounter (Signed)
Patient notified that [name] wants to change her prescription to Cardizem CD 360 mg by mouth daily. Prescription sent to patient's preferred pharmacy. Follow up appointment made with Nell Range on 11/29/16 at 8:30 am. Patient verbalized understanding and is in agreement with this plan.

## 2016-11-16 NOTE — Telephone Encounter (Signed)
New Message  Pt c/o medication issue:  1. Name of Medication: Cartia  2. How are you currently taking this medication (dosage and times per day)? 240mg   3. Are you having a reaction (difficulty breathing--STAT)? no  4. What is your medication issue? Pt requesting to speak with RN. Pt states she has no refills on medication would like to know if she needs to change her dosage that's she taking daily. Please call back to discuss

## 2016-11-16 NOTE — Telephone Encounter (Signed)
Patient states that when she was in the hospital in December that they stopped her losartan and changed her diltiazem 60 mg daily as needed to diltiazem 240 mg (24 hr capsule) daily. She states that she has 4 of the 240 mg pills left with no refills and wants to know if her dose can be changed to use the 60 mg pills which she already has or if she should continue the long acting 240 mg dose. She states that her BP has been running around 150/80. Please advise.

## 2016-11-17 ENCOUNTER — Telehealth: Payer: Self-pay

## 2016-11-17 NOTE — Telephone Encounter (Signed)
Please refill Cardizem CD 240 mg po daily

## 2016-11-17 NOTE — Telephone Encounter (Signed)
Patient states someone called her yesterday and changed her diltiazem to 360 mg qd. Patient states she doesn't think she needs to be on that high of a dose because her BP was well regulated on the 240 mg qd dose. Wants to know if [name] will call in and authorize the dose change. Please advise.

## 2016-11-18 MED ORDER — DILTIAZEM HCL ER COATED BEADS 240 MG PO CP24: 240.0000 mg | ORAL_CAPSULE | Freq: Every day | ORAL | 5 refills | Status: DC

## 2016-11-18 NOTE — Telephone Encounter (Signed)
Advised to bring blood pressure monitor to ov next week

## 2016-11-18 NOTE — Telephone Encounter (Signed)
Spoke with patient and she has been monitoring blood pressure at home and it has been elevated. Discussed with pharmacist and variances with blood pressure and felt secondary to Afib  She had elevated blood pressure at home yesterday 187/94, 185/96, and 132/89 all taken at the same time. When she went to a well screening fair offered by her PCP her blood pressure was 139/79 Advised to continue the Diltiazem 240 mg as recommended and scheduled follow up ov next week with Mickel Baas I PA Patient aware of date and time of ov

## 2016-11-23 ENCOUNTER — Encounter: Payer: Self-pay | Admitting: Cardiology

## 2016-11-23 ENCOUNTER — Encounter (INDEPENDENT_AMBULATORY_CARE_PROVIDER_SITE_OTHER): Payer: Self-pay

## 2016-11-23 ENCOUNTER — Ambulatory Visit (INDEPENDENT_AMBULATORY_CARE_PROVIDER_SITE_OTHER): Payer: Medicare Other | Admitting: Cardiology

## 2016-11-23 VITALS — BP 140/98 | HR 79 | Ht 62.0 in | Wt 129.8 lb

## 2016-11-23 DIAGNOSIS — I4819 Other persistent atrial fibrillation: Secondary | ICD-10-CM

## 2016-11-23 DIAGNOSIS — Z7901 Long term (current) use of anticoagulants: Secondary | ICD-10-CM | POA: Diagnosis not present

## 2016-11-23 DIAGNOSIS — I779 Disorder of arteries and arterioles, unspecified: Secondary | ICD-10-CM

## 2016-11-23 DIAGNOSIS — I10 Essential (primary) hypertension: Secondary | ICD-10-CM

## 2016-11-23 DIAGNOSIS — I739 Peripheral vascular disease, unspecified: Secondary | ICD-10-CM

## 2016-11-23 DIAGNOSIS — I481 Persistent atrial fibrillation: Secondary | ICD-10-CM

## 2016-11-23 MED ORDER — LOSARTAN POTASSIUM 25 MG PO TABS: 25.0000 mg | ORAL_TABLET | Freq: Every day | ORAL | 3 refills | Status: DC

## 2016-11-23 NOTE — Progress Notes (Signed)
Cardiology Office Note   Date:  11/23/2016   ID:  Sara Griffin, DOB Nov 13, 1929, MRN [id number]  PCP:  [name], MD  Cardiologist:  [name]     Chief Complaint  Patient presents with  . Hypertension    Seen for Dr. Nelson/BP/Afib      History of Present Illness: Sara Griffin is a 83 y.o. female who presents for BP issues.  She has a hx of PAF on coumadin, HTN, HLD and carotid  Stenosis.  Recent hospitalization for PAF with RVR 09/2016.  Echo with normal EF severely dilated LA and mild pulm. HTN.  Rate control was planned.  Last visit 11/03/16 and without complications. CHADSVASC of at least 61 (age, HTN, f sex). Dr. [name] in [place] usually follows her INR.   Since that visit her BP has been 150/80 and her meds changed to dilt 360 mg.  Recently her BP has been 187/94 to 132/89  Pt wanted to be back on the 240 mg of dilt.  She is here today to decide and check her BP cuff.    Today she does complain of some SOB at random times.  Here her BP is elevated and her home cuff is about 20 mmHg higher than out manual cuff.    Her insurance will not pay for cardizem 360 mg.  They suggest TartiaXT. Or Verapmil ER capsule or tab - she did not fill her dilt due to this-      Past Medical History:  Diagnosis Date  . Atrial fibrillation ([place])   . Hx of colonic polyps   . Hyperlipidemia   . Hypertension   . Uterine cancer [name])     Past Surgical History:  Procedure Laterality Date  . ABDOMINAL HYSTERECTOMY    . PARTIAL COLECTOMY       Current Outpatient Prescriptions  Medication Sig Dispense Refill  . diltiazem (CARDIZEM CD) 240 MG 24 hr capsule Take 1 capsule (240 mg total) by mouth daily. 30 capsule 5  . warfarin (JANTOVEN) 5 MG tablet Take 5 mg by mouth daily.     No current facility-administered medications for this visit.     Allergies:   Hydrocod polst-cpm polst er    Social History:  The patient  reports that she has never smoked. She has never used  smokeless tobacco. She reports that she does not drink alcohol or use drugs.   Family History:  The patient's family history includes Heart attack (age of onset: 85) in her son; Heart attack (age of onset: 59) in her father.    ROS:  General:no colds or fevers, no weight changes Skin:no rashes or ulcers HEENT:no blurred vision, no congestion CV:see HPI PUL:see HPI GI:no diarrhea constipation or melena, no indigestion GU:no hematuria, no dysuria MS:no joint pain, no claudication Neuro:no syncope, no lightheadedness Endo:no diabetes, no thyroid disease  Wt Readings from Last 3 Encounters:  11/23/16 129 lb 12.8 oz (58.9 kg)  11/03/16 128 lb (58.1 kg)  10/23/16 123 lb 8 oz (56 kg)     PHYSICAL EXAM: VS:  BP (!) 140/98   Pulse 79   Ht 5\' 2"  (1.575 m)   Wt 129 lb 12.8 oz (58.9 kg)   BMI 23.74 kg/m  , BMI Body mass index is 23.74 kg/m. General:Pleasant affect, NAD Skin:Warm and dry, brisk capillary refill HEENT:normocephalic, sclera clear, mucus membranes moist Neck:supple, no JVD, no bruits  Heart:irreg irreg without murmur, gallup, rub or click Lungs:clear without rales, rhonchi, or wheezes  [id number], non tender, + BS, do not palpate liver spleen or masses Ext:no lower ext edema, 1+ pedal pulses, 2+ radial pulses Neuro:alert and oriented X 3, MAE, follows commands, + facial symmetry    EKG:  EKG is ordered today. The ekg ordered today demonstrates a fib with rate of &(  Old ant sept MI.     Recent Labs: 10/21/2016: ALT 28; B Natriuretic Peptide 504.1; Hemoglobin 15.0; Magnesium 2.2; Platelets 166; TSH 5.130 10/23/2016: BUN 23; Creatinine, Ser 1.10; Potassium 4.1; Sodium 140    Lipid Panel No results found for: CHOL, TRIG, HDL, CHOLHDL, VLDL, LDLCALC, LDLDIRECT     Other studies Reviewed: Additional studies/ records that were reviewed today include: . ECHO: Study Conclusions  - Left ventricle: The cavity size was normal. Wall thickness was   increased in a  pattern of mild LVH. Indeterminant diastolic   function (atrial fibrillation). Systolic function was normal. The   estimated ejection fraction was in the range of 60% to 65%. Wall   motion was normal; there were no regional wall motion   abnormalities. - Aortic valve: There was no stenosis. There was trivial   regurgitation. - Mitral valve: Mildly calcified annulus. There was trivial   regurgitation. - Left atrium: The atrium was moderately to severely dilated. - Right ventricle: The cavity size was mildly dilated. Systolic   function was normal. - Right atrium: The atrium was mildly dilated. - Tricuspid valve: Peak RV-RA gradient (S): 31 mm Hg. - Pulmonary arteries: PA peak pressure: 39 mm Hg (S). - Systemic veins: IVC measured 1.9 cm with < 50% respirophasic   variation, suggesting RA pressure 8 mmHg.  Impressions:  - The patient was in atrial fibrillation. Normal LV size with mild   LV hypertrophy. EF 60-65%. Mildly dilated RV with normal systolic   function. Biatrial enlargement. Mild pulmonary hypertension.   ASSESSMENT AND PLAN:  1.  Labile HTN  May be due to her a fib.  Overall her BP is elevated.  Add losartan 25 mg daily   Recheck labs in 1 week at PCP, copy to [place].  Also BP recheck.   2. Persistent/chronic a fib with plans to only control rate.  Her insurance will not pay for cardizem 360 mg.  They suggest TartiaXT. Or Verapmil ER capsule or tab - she did not fill her dilt due to this-  If we need to increase wil have her take dilt 180 mg 2 pills daily  3. Anticoagulation followed by PCP    4. SOB may be due to HTN, will control BP first, if SOB continues would have her wear a 48 hour holter and eval HR.  Pt is active.  We discussed other ways to treat a fib. But would send to EP at that point.  She is to follow up with [name] in April    Current medicines are reviewed with the patient today.  The patient Has no concerns regarding medicines.  The following  changes have been made:  See above Labs/ tests ordered today include:see above  Disposition:   FU:  see above  Signed, Cecilie Kicks, NP  11/23/2016 10:08 AM    [facility], [place], [place], [place] Phone: [phone]; Fax: [phone]

## 2016-11-23 NOTE — Patient Instructions (Addendum)
Medication Instructions:  Your physician has recommended you make the following change in your medication:  1.  START Losartan 25 mg taking 1 tablet daily   Labwork: 1 WEEK AT YOUR PRIMARY CARE PHYSICIAN:  BMET AND BLOOD PRESSURE CHECK PLEASE HAVE THEM FAX RESULTS TO Cecilie Kicks, NP ATTNRico Junker AT [phone]  Testing/Procedures: None ordered  Follow-Up: Your physician recommends that you schedule a follow-up appointment in: SEE [name] AS PLANNED   Any Other Special Instructions Will Be Listed Below (If Applicable).     If you need a refill on your cardiac medications before your next appointment, please call your pharmacy.

## 2016-11-29 ENCOUNTER — Ambulatory Visit: Payer: Medicare Other | Admitting: Physician Assistant

## 2016-11-30 DIAGNOSIS — K635 Polyp of colon: Secondary | ICD-10-CM | POA: Insufficient documentation

## 2016-11-30 DIAGNOSIS — R42 Dizziness and giddiness: Secondary | ICD-10-CM | POA: Insufficient documentation

## 2016-11-30 DIAGNOSIS — H9190 Unspecified hearing loss, unspecified ear: Secondary | ICD-10-CM | POA: Insufficient documentation

## 2017-01-18 ENCOUNTER — Encounter: Payer: Self-pay | Admitting: Cardiology

## 2017-02-01 ENCOUNTER — Ambulatory Visit (INDEPENDENT_AMBULATORY_CARE_PROVIDER_SITE_OTHER): Payer: Medicare Other | Admitting: Cardiology

## 2017-02-01 ENCOUNTER — Encounter: Payer: Self-pay | Admitting: Cardiology

## 2017-02-01 VITALS — BP 126/70 | HR 71 | Ht 62.0 in | Wt 127.0 lb

## 2017-02-01 DIAGNOSIS — I482 Chronic atrial fibrillation, unspecified: Secondary | ICD-10-CM

## 2017-02-01 DIAGNOSIS — E7849 Other hyperlipidemia: Secondary | ICD-10-CM

## 2017-02-01 DIAGNOSIS — R600 Localized edema: Secondary | ICD-10-CM

## 2017-02-01 DIAGNOSIS — I10 Essential (primary) hypertension: Secondary | ICD-10-CM | POA: Diagnosis not present

## 2017-02-01 DIAGNOSIS — E784 Other hyperlipidemia: Secondary | ICD-10-CM

## 2017-02-01 DIAGNOSIS — R0989 Other specified symptoms and signs involving the circulatory and respiratory systems: Secondary | ICD-10-CM | POA: Insufficient documentation

## 2017-02-01 MED ORDER — FUROSEMIDE 20 MG PO TABS: ORAL_TABLET | ORAL | 3 refills | Status: DC

## 2017-02-01 MED ORDER — DILTIAZEM HCL ER COATED BEADS 120 MG PO CP24: 120.0000 mg | ORAL_CAPSULE | Freq: Every day | ORAL | 2 refills | Status: DC

## 2017-02-01 NOTE — Patient Instructions (Addendum)
Medication Instructions:   STOP TAKING CARDIZEM CD 240 MG NOW  STOP TAKING DILTIAZEM 60 MG NOW  START TAKING DILTIAZEM (CARDIZEM CD) 120 MG ONCE DAILY   START TAKING LASIX 20 MG BY MOUTH DAILY FOR 3 DAYS ONLY, THEN TAKE 1 TABLET BY MOUTH DAILY AS NEEDED FOR LOWER EXTREMITY SWELLING, THEREAFTER.     Labwork:  TODAY--BMET AND PRO-BNP     Follow-Up:  4 WEEKS WITH AN EXTENDER ON DR NELSON'S TEAM       If you need a refill on your cardiac medications before your next appointment, please call your pharmacy.

## 2017-02-01 NOTE — Progress Notes (Signed)
Cardiology Office Note   Date:  02/01/2017   ID:  Sara Griffin, DOB Jul 01, 1930, MRN [id number]  PCP:  [name], MD  Cardiologist:  [name]     Chief complain: LE edema  History of Present Illness: Sara Griffin is a 83 y.o. female who presents for BP issues.  She has a hx of PAF on coumadin, HTN, HLD and carotid  Stenosis.  Recent hospitalization for PAF with RVR 09/2016.  Echo with normal EF severely dilated LA and mild pulm. HTN.  Rate control was planned.  Last visit 11/03/16 and without complications. CHADSVASC of at least 90 (age, HTN, f sex). Dr. [name] in [place] usually follows her INR.   Since that visit her BP has been 150/80 and her meds changed to dilt 360 mg.  Recently her BP has been 187/94 to 132/89  Pt wanted to be back on the 240 mg of dilt.  She is here today to decide and check her BP cuff.    Today she does complain of some SOB at random times.  Here her BP is elevated and her home cuff is about 20 mmHg higher than out manual cuff.    02/01/2017 - the patient is coming after 2 months, she is doing well, denies DOE or CP< she has developed a mild LE edema that is new for her. No orthopnea, PND. She has been alternating cardizem SA 60 mg po daily and Cardizem CD 240 mg po daily. Denies palpitations or syncope.   Past Medical History:  Diagnosis Date  . Atrial fibrillation ([place])   . Hx of colonic polyps   . Hyperlipidemia   . Hypertension   . Uterine cancer [facility])     Past Surgical History:  Procedure Laterality Date  . ABDOMINAL HYSTERECTOMY    . PARTIAL COLECTOMY       Current Outpatient Prescriptions  Medication Sig Dispense Refill  . losartan (COZAAR) 50 MG tablet Take 25-50 mg by mouth daily.    Marland Kitchen warfarin (JANTOVEN) 5 MG tablet Take 5 mg by mouth daily.    Marland Kitchen diltiazem (CARDIZEM CD) 120 MG 24 hr capsule Take 1 capsule (120 mg total) by mouth daily. 90 capsule 2  . furosemide (LASIX) 20 MG tablet Take 1 tablet by mouth daily for 3 days only,  then take 1 tablet by mouth daily as needed for lower extremity swelling thereafter. 30 tablet 3   No current facility-administered medications for this visit.     Allergies:   Hydrocod polst-cpm polst er    Social History:  The patient  reports that she has never smoked. She has never used smokeless tobacco. She reports that she does not drink alcohol or use drugs.   Family History:  The patient's family history includes Heart attack (age of onset: 30) in her son; Heart attack (age of onset: 11) in her father.    ROS:  General:no colds or fevers, no weight changes Skin:no rashes or ulcers HEENT:no blurred vision, no congestion CV:see HPI PUL:see HPI GI:no diarrhea constipation or melena, no indigestion GU:no hematuria, no dysuria MS:no joint pain, no claudication Neuro:no syncope, no lightheadedness Endo:no diabetes, no thyroid disease  Wt Readings from Last 3 Encounters:  02/01/17 127 lb (57.6 kg)  11/23/16 129 lb 12.8 oz (58.9 kg)  11/03/16 128 lb (58.1 kg)     PHYSICAL EXAM: VS:  BP 126/70   Pulse 71   Ht 5\' 2"  (1.575 m)   Wt 127 lb (57.6 kg)  BMI 23.23 kg/m  , BMI Body mass index is 23.23 kg/m. General:Pleasant affect, NAD Skin:Warm and dry, brisk capillary refill HEENT:normocephalic, sclera clear, mucus membranes moist Neck:supple, no JVD, no bruits  Heart:irreg irreg without murmur, gallup, rub or click Lungs:clear without rales, rhonchi, or wheezes LPF:XTKW, non tender, + BS, do not palpate liver spleen or masses Ext: mild lower ext edema, 1+ pedal pulses, 2+ radial pulses Neuro:alert and oriented X 3, MAE, follows commands, + facial symmetry    EKG:  EKG is ordered today. The ekg ordered today demonstrates a fib with rate of &(  Old ant sept MI.     Recent Labs: 10/21/2016: ALT 28; B Natriuretic Peptide 504.1; Hemoglobin 15.0; Magnesium 2.2; Platelets 166; TSH 5.130 10/23/2016: BUN 23; Creatinine, Ser 1.10; Potassium 4.1; Sodium 140    Lipid  Panel No results found for: CHOL, TRIG, HDL, CHOLHDL, VLDL, LDLCALC, LDLDIRECT     Other studies Reviewed: Additional studies/ records that were reviewed today include: . ECHO: Study Conclusions  - Left ventricle: The cavity size was normal. Wall thickness was   increased in a pattern of mild LVH. Indeterminant diastolic   function (atrial fibrillation). Systolic function was normal. The   estimated ejection fraction was in the range of 60% to 65%. Wall   motion was normal; there were no regional wall motion   abnormalities. - Aortic valve: There was no stenosis. There was trivial   regurgitation. - Mitral valve: Mildly calcified annulus. There was trivial   regurgitation. - Left atrium: The atrium was moderately to severely dilated. - Right ventricle: The cavity size was mildly dilated. Systolic   function was normal. - Right atrium: The atrium was mildly dilated. - Tricuspid valve: Peak RV-RA gradient (S): 31 mm Hg. - Pulmonary arteries: PA peak pressure: 39 mm Hg (S). - Systemic veins: IVC measured 1.9 cm with < 50% respirophasic   variation, suggesting RA pressure 8 mmHg.  Impressions:  - The patient was in atrial fibrillation. Normal LV size with mild   LV hypertrophy. EF 60-65%. Mildly dilated RV with normal systolic   function. Biatrial enlargement. Mild pulmonary hypertension.   ASSESSMENT AND PLAN:  1.  Lower extremity edema - new problem - decrease cardizem CD to 120 mg po daily - start lasix 20 mg po daily for 3 days, afterwards as needed   2. Labile HTN  - controlled on current regimen.   3. Persistent/chronic a fib with plans to only control rate.  We will change to cardizem CD 120 mg po daily  4. Anticoagulation followed by PCP, no bleeding    Current medicines are reviewed with the patient today.  The patient Has no concerns regarding medicines.  The following changes have been made:  See above Labs/ tests ordered today include:see  above  Disposition:   Follow up in 4 weeks with me.  Signed, Ena Dawley, MD  02/01/2017 7:47 PM    [facility] [place], [place], [place] Phone: [phone]; Fax: [phone]

## 2017-02-02 ENCOUNTER — Telehealth: Payer: Self-pay | Admitting: *Deleted

## 2017-02-02 DIAGNOSIS — R600 Localized edema: Secondary | ICD-10-CM

## 2017-02-02 DIAGNOSIS — R0989 Other specified symptoms and signs involving the circulatory and respiratory systems: Secondary | ICD-10-CM

## 2017-02-02 DIAGNOSIS — E7849 Other hyperlipidemia: Secondary | ICD-10-CM

## 2017-02-02 DIAGNOSIS — I482 Chronic atrial fibrillation, unspecified: Secondary | ICD-10-CM

## 2017-02-02 LAB — BASIC METABOLIC PANEL
BUN/Creatinine Ratio: 26 (ref 12–28)
BUN: 22 mg/dL (ref 8–27)
CO2: 23 mmol/L (ref 18–29)
Calcium: 9.1 mg/dL (ref 8.7–10.3)
Chloride: 102 mmol/L (ref 96–106)
Creatinine, Ser: 0.85 mg/dL (ref 0.57–1.00)
GFR calc Af Amer: 73 mL/min/{1.73_m2} (ref >59)
GFR calc non Af Amer: 63 mL/min/{1.73_m2} (ref >59)
Glucose: 81 mg/dL (ref 65–99)
Potassium: 4.5 mmol/L (ref 3.5–5.2)
Sodium: 142 mmol/L (ref 134–144)

## 2017-02-02 LAB — PRO B NATRIURETIC PEPTIDE: NT-Pro BNP: 2039 pg/mL — ABNORMAL HIGH (ref 0–738)

## 2017-02-02 MED ORDER — FUROSEMIDE 20 MG PO TABS: 20.0000 mg | ORAL_TABLET | Freq: Every day | ORAL | 3 refills | Status: DC

## 2017-02-02 NOTE — Telephone Encounter (Signed)
Notified the pt that per Dr [name], based on her labs, she should start taking her lasix now at 20 mg po daily, until her next OV on 03/01/17.  Changed in pts med list.  Pt confirmed she has enough on hand.  Pt verbalized understanding and agrees with this plan.

## 2017-02-02 NOTE — Telephone Encounter (Signed)
-----   Message from [name], MD sent at 02/02/2017  5:01 PM EDT ----- I told her to take lasix 20 mg po daily x 3 days, please tell her to continue doing it till the next visit. Thank you, K

## 2017-02-03 ENCOUNTER — Telehealth: Payer: Self-pay | Admitting: Cardiology

## 2017-02-03 NOTE — Telephone Encounter (Signed)
Spoke with patient's son, Devona Konig, about Dr Francesca Oman recommendation to take lasix 20mg  daily until next office visit in May 2018.

## 2017-02-03 NOTE — Telephone Encounter (Signed)
New Message      Pt son needs to know what Sara Griffin told his mother yesterday, she is very confused about the conversation

## 2017-02-09 ENCOUNTER — Encounter: Payer: Self-pay | Admitting: Cardiology

## 2017-03-01 ENCOUNTER — Encounter: Payer: Self-pay | Admitting: Cardiology

## 2017-03-01 ENCOUNTER — Ambulatory Visit (INDEPENDENT_AMBULATORY_CARE_PROVIDER_SITE_OTHER): Payer: Medicare Other | Admitting: Cardiology

## 2017-03-01 VITALS — BP 132/80 | HR 88 | Ht 62.0 in | Wt 123.0 lb

## 2017-03-01 DIAGNOSIS — Z79899 Other long term (current) drug therapy: Secondary | ICD-10-CM | POA: Diagnosis not present

## 2017-03-01 LAB — BASIC METABOLIC PANEL
BUN/Creatinine Ratio: 30 — ABNORMAL HIGH (ref 12–28)
BUN: 26 mg/dL (ref 8–27)
CO2: 22 mmol/L (ref 18–29)
Calcium: 9.3 mg/dL (ref 8.7–10.3)
Chloride: 103 mmol/L (ref 96–106)
Creatinine, Ser: 0.86 mg/dL (ref 0.57–1.00)
GFR calc Af Amer: 72 mL/min/{1.73_m2} (ref >59)
GFR calc non Af Amer: 62 mL/min/{1.73_m2} (ref >59)
Glucose: 82 mg/dL (ref 65–99)
Potassium: 5.3 mmol/L — ABNORMAL HIGH (ref 3.5–5.2)
Sodium: 142 mmol/L (ref 134–144)

## 2017-03-01 NOTE — Progress Notes (Signed)
03/01/2017 DIMPLES PROBUS   [birth year]  [id number]  Primary Physician Sara Griffin Sara Graff, MD Primary Cardiologist: Dr. Radford Griffin    Reason for Visit/CC: f/u for HTN and Bilateral LEE  HPI:  Sara Griffin is a 83 y.o. female, followed by Dr. Meda Griffin, who presents to clinic today for 4 week f/u for HTN and bilateral LEE.   She is also followed medically by Dr. Garlon Griffin in [place]. Her PMH is significant for chronic atrial fibrillation, on rate control therapy. She is on coumadin for a/c given high CHA2DS2 VASc score of at least 4 (age, HTN, f sex). Dr. Garlon Griffin in [place] follows her INR. She also has a h/o HTN, HLD and carotid artery stenosis. Her most recent echocardiogram 09/2016 showed normal EF, severely dilated LA and mild pulm. HTN.  In January, she started having issues with her BP being elevated. She was placed on Cardizem 240 mg. She was continued on Losartan, 50 mg. She was seen back by Dr. Meda Griffin 02/01/17 for f/u. BP had improved, however she had a new complaint of bilateral LEE. BNP was abnormal at 2,039. Renal function and K were normal. Dr. Meda Griffin elected to decrease her Cardizem dose down to 120 mg daily and added Lasix, 20 mg x 3 days followed by PRN dosing there after. The patient was advised to f/u in 4 weeks.   She is here today with her son. She reports improvement. Her LEE has resolved. She is now only taking lasix PRN but has not required any further use. Her BP is well controlled at 132/80. Pulse rate is 88 bpm. Currently asymptomatic with her afib. She reports full compliance with coumadin. No abnormal bleeding or falls.     Current Meds  Medication Sig  . diltiazem (CARDIZEM CD) 120 MG 24 hr capsule Take 1 capsule (120 mg total) by mouth daily.  . furosemide (LASIX) 20 MG tablet Take 20 mg by mouth daily as needed.  Marland Kitchen losartan (COZAAR) 50 MG tablet Take 25-50 mg by mouth daily as needed.   . warfarin (JANTOVEN) 5 MG tablet Take 5 mg by mouth daily.   Allergies    Allergen Reactions  . Hydrocod Polst-Cpm Polst Er Other (See Comments)    Felt bad   Past Medical History:  Diagnosis Date  . Atrial fibrillation ([place])   . Hx of colonic polyps   . Hyperlipidemia   . Hypertension   . Uterine cancer ([place])    Family History  Problem Relation Age of Onset  . Heart attack Father 49    died  . Heart disease      famil,y hx of  . Heart attack Son 58    died   Past Surgical History:  Procedure Laterality Date  . ABDOMINAL HYSTERECTOMY    . PARTIAL COLECTOMY     Social History   Social History  . Marital status: Married    Spouse name: N/A  . Number of children: 5  . Years of education: N/A   Occupational History  . Not on file.   Social History Main Topics  . Smoking status: Never Smoker  . Smokeless tobacco: Never Used  . Alcohol use No  . Drug use: No  . Sexual activity: Not on file   Other Topics Concern  . Not on file   Social History Narrative  . No narrative on file     Review of Systems: General: negative for chills, fever, night sweats or weight changes.  Cardiovascular: negative for  chest pain, dyspnea on exertion, edema, orthopnea, palpitations, paroxysmal nocturnal dyspnea or shortness of breath Dermatological: negative for rash Respiratory: negative for cough or wheezing Urologic: negative for hematuria Abdominal: negative for nausea, vomiting, diarrhea, bright red blood per rectum, melena, or hematemesis Neurologic: negative for visual changes, syncope, or dizziness All other systems reviewed and are otherwise negative except as noted above.   Physical Exam:  Blood pressure 132/80, pulse 88, height 5\' 2"  (1.575 m), weight 123 lb (55.8 kg).  General appearance: alert, cooperative and no distress Neck: no carotid bruit and no JVD Lungs: clear to auscultation bilaterally Heart: irregularly irregular rhythm and regular rate Extremities: extremities normal, atraumatic, no cyanosis or edema Pulses: 2+ and  symmetric Skin: Skin color, texture, turgor normal. No rashes or lesions Neurologic: Alert and oriented X 3, normal strength and tone. Normal symmetric reflexes. Normal coordination and gait  EKG not performed -- personally reviewed   ASSESSMENT AND PLAN:   1. LEE: resolved with Lasix. Now only taking Lasix PRN. No dyspnea. Check BMP today to ensure renal function and K are both stable after recent treatment with Lasix.  2. HTN: BP is well controlled on current regimen. Continue Cardizem and Losartan. F/u BMP today.   3. Chronic Atrial Fibrillation: rate is controlled with Cardizem. HR in the 80s. Asymptomatic. CHA2DS2 VASc score is 4. Continue Coumadin for a/c. INRs are followed by her PCP.     PLAN Continue current regimen. f/u BMP today.  f/u with Dr. Meda Griffin in 6 months     [place] PA-C, [facility] 03/01/2017 8:21 AM

## 2017-03-01 NOTE — Patient Instructions (Addendum)
Medication Instructions:   Your physician recommends that you continue on your current medications as directed. Please refer to the Current Medication list given to you today.   If you need a refill on your cardiac medications before your next appointment, please call your pharmacy.  Labwork:  BMET TODAY    Testing/Procedures: NONE ORDERED  TODAY    Follow-Up:  Your physician wants you to follow-up in:  IN  6  MONTHS WITH DR  Sara Griffin will receive a reminder letter in the mail two months in advance. If you don't receive a letter, please call our office to schedule the follow-up appointment.      Any Other Special Instructions Will Be Listed Below (If Applicable).

## 2017-03-02 ENCOUNTER — Telehealth: Payer: Self-pay | Admitting: *Deleted

## 2017-03-02 DIAGNOSIS — Z79899 Other long term (current) drug therapy: Secondary | ICD-10-CM

## 2017-03-02 NOTE — Telephone Encounter (Signed)
-----   Message from Consuelo Pandy, [place] sent at 03/02/2017  2:47 PM EDT ----- Kidney function is ok. Her potassium is a tad bit high. I recommend that she get a repeat BMP tomorrow just to insure that her level does not go up any higher. She lives in [place], so it would be ok to get this checked at her PCP office, Dr. [name].

## 2017-03-06 ENCOUNTER — Telehealth: Payer: Self-pay | Admitting: Cardiology

## 2017-03-06 NOTE — Telephone Encounter (Signed)
Pt calling to see if we have the results of her labs from Friday, May 11th from her PCP, [name] office-pls call

## 2017-03-06 NOTE — Telephone Encounter (Signed)
PT'S  LAST  K WAS  4.3  WHICH  IS  NORMAL AND DOWN  FROM PREVIOUS  5.3  PT  AWARE .Adonis Housekeeper

## 2017-06-06 DIAGNOSIS — R5383 Other fatigue: Secondary | ICD-10-CM | POA: Insufficient documentation

## 2017-06-27 ENCOUNTER — Other Ambulatory Visit: Payer: Self-pay | Admitting: Cardiology

## 2017-08-22 ENCOUNTER — Encounter: Payer: Self-pay | Admitting: *Deleted

## 2017-09-06 ENCOUNTER — Ambulatory Visit (INDEPENDENT_AMBULATORY_CARE_PROVIDER_SITE_OTHER): Payer: Medicare Other | Admitting: Cardiology

## 2017-09-06 ENCOUNTER — Encounter: Payer: Self-pay | Admitting: Cardiology

## 2017-09-06 DIAGNOSIS — R0989 Other specified symptoms and signs involving the circulatory and respiratory systems: Secondary | ICD-10-CM | POA: Diagnosis not present

## 2017-09-06 DIAGNOSIS — I482 Chronic atrial fibrillation, unspecified: Secondary | ICD-10-CM

## 2017-09-06 DIAGNOSIS — E7849 Other hyperlipidemia: Secondary | ICD-10-CM | POA: Diagnosis not present

## 2017-09-06 DIAGNOSIS — R600 Localized edema: Secondary | ICD-10-CM

## 2017-09-06 DIAGNOSIS — I5033 Acute on chronic diastolic (congestive) heart failure: Secondary | ICD-10-CM | POA: Diagnosis not present

## 2017-09-06 LAB — PROTIME-INR
INR: 2 — ABNORMAL HIGH (ref 0.8–1.2)
Prothrombin Time: 20 s — ABNORMAL HIGH (ref 9.1–12.0)

## 2017-09-06 LAB — BASIC METABOLIC PANEL
BUN/Creatinine Ratio: 27 (ref 12–28)
BUN: 28 mg/dL — ABNORMAL HIGH (ref 8–27)
CO2: 25 mmol/L (ref 20–29)
Calcium: 9.1 mg/dL (ref 8.7–10.3)
Chloride: 102 mmol/L (ref 96–106)
Creatinine, Ser: 1.05 mg/dL — ABNORMAL HIGH (ref 0.57–1.00)
GFR calc Af Amer: 56 mL/min/{1.73_m2} — ABNORMAL LOW (ref >59)
GFR calc non Af Amer: 49 mL/min/{1.73_m2} — ABNORMAL LOW (ref >59)
Glucose: 87 mg/dL (ref 65–99)
Potassium: 4.3 mmol/L (ref 3.5–5.2)
Sodium: 142 mmol/L (ref 134–144)

## 2017-09-06 LAB — CBC WITH DIFFERENTIAL/PLATELET
Basophils Absolute: 0 10*3/uL (ref 0.0–0.2)
Basos: 0 %
EOS (ABSOLUTE): 0.2 10*3/uL (ref 0.0–0.4)
Eos: 3 %
Hematocrit: 45.2 % (ref 34.0–46.6)
Hemoglobin: 14.9 g/dL (ref 11.1–15.9)
Immature Grans (Abs): 0 10*3/uL (ref 0.0–0.1)
Immature Granulocytes: 0 %
Lymphocytes Absolute: 1.9 10*3/uL (ref 0.7–3.1)
Lymphs: 31 %
MCH: 31.8 pg (ref 26.6–33.0)
MCHC: 33 g/dL (ref 31.5–35.7)
MCV: 97 fL (ref 79–97)
Monocytes Absolute: 0.4 10*3/uL (ref 0.1–0.9)
Monocytes: 7 %
Neutrophils Absolute: 3.6 10*3/uL (ref 1.4–7.0)
Neutrophils: 59 %
Platelets: 173 10*3/uL (ref 150–379)
RBC: 4.68 x10E6/uL (ref 3.77–5.28)
RDW: 14.3 % (ref 12.3–15.4)
WBC: 6.1 10*3/uL (ref 3.4–10.8)

## 2017-09-06 LAB — PRO B NATRIURETIC PEPTIDE: NT-Pro BNP: 3519 pg/mL — ABNORMAL HIGH (ref 0–738)

## 2017-09-06 MED ORDER — LOSARTAN POTASSIUM 25 MG PO TABS: ORAL_TABLET | ORAL | 3 refills | Status: DC

## 2017-09-06 MED ORDER — DILTIAZEM HCL ER COATED BEADS 120 MG PO CP24: 120.0000 mg | ORAL_CAPSULE | Freq: Every day | ORAL | 2 refills | Status: DC

## 2017-09-06 MED ORDER — FUROSEMIDE 20 MG PO TABS: 20.0000 mg | ORAL_TABLET | Freq: Every day | ORAL | 2 refills | Status: DC

## 2017-09-06 NOTE — Progress Notes (Signed)
09/06/2017 JACARA BENITO   1930/05/01  [id number]  Primary Physician [name] [name], MD Primary Cardiologist: [name]    Reason for Visit/CC: f/u for HTN and Bilateral LEE  HPI:  [place] is a 83 y.o. female, followed by [name], who presents to clinic today for 4 week f/u for HTN and bilateral LEE.   She is also followed medically by Dr. [name] in [place]. Her PMH is significant for chronic atrial fibrillation, on rate control therapy. She is on coumadin for a/c given high CHA2DS2 VASc score of at least 4 (age, HTN, f sex). Dr. [name] in [place] follows her INR. She also has a h/o HTN, HLD and carotid artery stenosis. Her most recent echocardiogram 09/2016 showed normal EF, severely dilated LA and mild pulm. HTN.  In January, she started having issues with her BP being elevated. She was placed on Cardizem 240 mg. She was continued on Losartan, 50 mg. She was seen back by [name] 02/01/17 for f/u. BP had improved, however she had a new complaint of bilateral LEE. BNP was abnormal at 2,039. Renal function and K were normal. [name] elected to decrease her Cardizem dose down to 120 mg daily and added Lasix, 20 mg x 3 days followed by PRN dosing there after. The patient was advised to f/u in 4 weeks.   09/06/2017 - the patient is coming after a visit to her primary care physician on November 2. She will progressively worsening shortness of breath and lower extremity edema with orthopnea. She was started on Lasix 20 mg daily, previously only when necessary and has lost 6 pounds so far. She has residual puffiness around her ankle and some mild shortness of breath but overall improved. She denies any chest pain. No bleeding.   Current Meds  Medication Sig  . diltiazem (CARDIZEM CD) 120 MG 24 hr capsule Take 1 capsule (120 mg total) by mouth daily.  . furosemide (LASIX) 20 MG tablet Take 20 mg daily by mouth.   . losartan (COZAAR) 25 MG tablet TAKE 1 TABLET(25 MG) BY MOUTH  DAILY  . warfarin (JANTOVEN) 5 MG tablet Take 5 mg by mouth daily.   Allergies  Allergen Reactions  . Hydrocod Polst-Cpm Polst Er Other (See Comments)    Felt bad   Past Medical History:  Diagnosis Date  . Atrial fibrillation ([place])   . Hx of colonic polyps   . Hyperlipidemia   . Hypertension   . Uterine cancer ([place])    Family History  Problem Relation Age of Onset  . Heart attack Father 69       died  . Heart disease Unknown        famil,y hx of  . Heart attack Son 53       died   Past Surgical History:  Procedure Laterality Date  . ABDOMINAL HYSTERECTOMY    . PARTIAL COLECTOMY     Social History   Socioeconomic History  . Marital status: Married    Spouse name: Not on file  . Number of children: 5  . Years of education: Not on file  . Highest education level: Not on file  Social Needs  . Financial resource strain: Not on file  . Food insecurity - worry: Not on file  . Food insecurity - inability: Not on file  . Transportation needs - medical: Not on file  . Transportation needs - non-medical: Not on file  Occupational History  . Not on file  Tobacco Use  . Smoking status: Never Smoker  . Smokeless tobacco: Never Used  Substance and Sexual Activity  . Alcohol use: No  . Drug use: No  . Sexual activity: Not on file  Other Topics Concern  . Not on file  Social History Narrative  . Not on file     Review of Systems: General: negative for chills, fever, night sweats or weight changes.  Cardiovascular: negative for chest pain, dyspnea on exertion, edema, orthopnea, palpitations, paroxysmal nocturnal dyspnea or shortness of breath Dermatological: negative for rash Respiratory: negative for cough or wheezing Urologic: negative for hematuria Abdominal: negative for nausea, vomiting, diarrhea, bright red blood per rectum, melena, or hematemesis Neurologic: negative for visual changes, syncope, or dizziness All other systems reviewed and are otherwise negative  except as noted above.  Physical Exam:  Blood pressure 124/84, pulse 84, height 5\' 2"  (1.575 m), weight 120 lb (54.4 kg), SpO2 99 %.  General appearance: alert, cooperative and no distress Neck: no carotid bruit and no JVD Lungs: clear to auscultation bilaterally Heart: irregularly irregular rhythm and regular rate Extremities: extremities normal, atraumatic, B/L ankle edema Pulses: 2+ and symmetric Skin: Skin color, texture, turgor normal. No rashes or lesions Neurologic: Alert and oriented X 3, normal strength and tone. Normal symmetric reflexes. Normal coordination and gait  EKG performed -- personally reviewed  And shows coarse atrial fibrillation with ventricular rte 84 bpm negative T waves in the inferolateral leads unchanged from prior.   ASSESSMENT AND PLAN:   1. Acute on chronic diastolic CHF - we will start Lasix 20 mg daily, previously just when necessary, and increase to 40 mg daily when her weight goes up by 3 pounds overnight. Her baseline here is 120 pounds at home 117 pounds. She is advised to use compression stockings. Follow-up in 4 weeks. We will check BMP and BNP today.  2. HTN: BP is well controlled here, elevated at home.  3. Chronic Atrial Fibrillation: rate is controlled with Cardizem. HR in the 80s. Asymptomatic. CHA2DS2 VASc score is 4. Continue Coumadin for a/c.We will check INR here today.Marland Kitchen    PLANcheck CBC, INR, BMP and BNP today follow-up in 4 weeks.   Ena Dawley , MD [facility] 09/06/2017 10:58 AM

## 2017-09-06 NOTE — Patient Instructions (Signed)
Medication Instructions:   Your physician recommends that you continue on your current medications as directed. Please refer to the Current Medication list given to you today.   MAKE SURE YOU TAKE YOUR LASIX 20 MG BY MOUTH DAILY     Labwork:  TODAY--BMET, PRO-BNP, PT/INR, AND CBC W DIFF    Follow-Up:  4 WEEKS WITH AN EXTENDER IN OUR OFFICE.  DR Marcelle Overlie LIKE FOR THE PATIENT TO IDEALLY BE SCHEDULED THE WEEK OF 10/02/17  WITH APP IF POSSIBLE       If you need a refill on your cardiac medications before your next appointment, please call your pharmacy.

## 2017-09-08 ENCOUNTER — Encounter: Payer: Self-pay | Admitting: *Deleted

## 2017-10-02 ENCOUNTER — Ambulatory Visit: Payer: Medicare Other | Admitting: Physician Assistant

## 2017-10-24 DIAGNOSIS — I071 Rheumatic tricuspid insufficiency: Secondary | ICD-10-CM

## 2017-10-24 DIAGNOSIS — I340 Nonrheumatic mitral (valve) insufficiency: Secondary | ICD-10-CM

## 2017-10-24 HISTORY — DX: Nonrheumatic mitral (valve) insufficiency: I34.0

## 2017-10-24 HISTORY — DX: Rheumatic tricuspid insufficiency: I07.1

## 2017-10-30 ENCOUNTER — Ambulatory Visit (INDEPENDENT_AMBULATORY_CARE_PROVIDER_SITE_OTHER): Payer: Medicare Other | Admitting: Physician Assistant

## 2017-10-30 ENCOUNTER — Encounter: Payer: Self-pay | Admitting: Physician Assistant

## 2017-10-30 VITALS — BP 142/80 | HR 77 | Ht 62.0 in | Wt 124.0 lb

## 2017-10-30 DIAGNOSIS — I482 Chronic atrial fibrillation, unspecified: Secondary | ICD-10-CM

## 2017-10-30 DIAGNOSIS — I5033 Acute on chronic diastolic (congestive) heart failure: Secondary | ICD-10-CM | POA: Diagnosis not present

## 2017-10-30 DIAGNOSIS — R0989 Other specified symptoms and signs involving the circulatory and respiratory systems: Secondary | ICD-10-CM | POA: Diagnosis not present

## 2017-10-30 LAB — BASIC METABOLIC PANEL
BUN/Creatinine Ratio: 24 (ref 12–28)
BUN: 29 mg/dL — ABNORMAL HIGH (ref 8–27)
CALCIUM: 9.2 mg/dL (ref 8.7–10.3)
CO2: 22 mmol/L (ref 20–29)
CREATININE: 1.22 mg/dL — AB (ref 0.57–1.00)
Chloride: 103 mmol/L (ref 96–106)
GFR calc Af Amer: 47 mL/min/{1.73_m2} — ABNORMAL LOW (ref >59)
GFR calc non Af Amer: 40 mL/min/{1.73_m2} — ABNORMAL LOW (ref >59)
GLUCOSE: 94 mg/dL (ref 65–99)
POTASSIUM: 5 mmol/L (ref 3.5–5.2)
Sodium: 141 mmol/L (ref 134–144)

## 2017-10-30 LAB — PRO B NATRIURETIC PEPTIDE: NT-Pro BNP: 4473 pg/mL — ABNORMAL HIGH (ref 0–738)

## 2017-10-30 MED ORDER — FUROSEMIDE 40 MG PO TABS: 40.0000 mg | ORAL_TABLET | Freq: Every day | ORAL | 3 refills | Status: DC

## 2017-10-30 MED ORDER — DILTIAZEM HCL ER COATED BEADS 240 MG PO CP24: 240.0000 mg | ORAL_CAPSULE | Freq: Every day | ORAL | 3 refills | Status: DC

## 2017-10-30 NOTE — Patient Instructions (Addendum)
Medication Instructions:  Increase lasix (furosemide) to 40 mg daily. You can take 2 of your 20 mg tablets daily at the same time.  Increase Diltiazem CD ([place]) to 240 mg daily. You can take 2 of your 120 mg capsules daily at the same time.   Labwork: BMET/pro-BNP today.  Testing/Procedures: Your physician has requested that you have an echocardiogram. Echocardiography is a painless test that uses sound waves to create images of your heart. It provides your doctor with information about the size and shape of your heart and how well your heart's chambers and valves are working. This procedure takes approximately one hour. There are no restrictions for this procedure.    Follow-Up: Your physician recommends that you schedule a follow-up appointment in: 3-4 weeks with Ermalinda Barrios, PA.  Your physician recommends that you schedule a follow-up appointment in: April 2019 with Dr [name].  Use knee high compression stockings to help the swelling in your feet and legs. Put them on in the morning and take them off at night.  Your physician recommends that you weigh, daily, at the same time every day, and in the same amount of clothing. Please record your daily weights and bring it to your next appointment.    If you gain more than 3-4 pounds in 24 hours or 5 pounds in 1 week, call our office [phone].       Low-Sodium Eating Plan Sodium, which is an element that makes up salt, helps you maintain a healthy balance of fluids in your body. Too much sodium can increase your blood pressure and cause fluid and waste to be held in your body. Your health care provider or dietitian may recommend following this plan if you have high blood pressure (hypertension), kidney disease, liver disease, or heart failure. Eating less sodium can help lower your blood pressure, reduce swelling, and protect your heart, liver, and kidneys. What are tips for following this plan? General guidelines  --Limit  your sodium intake to 2,000 mg (milligrams) of sodium each day. Reading food labels  The Nutrition Facts label lists the amount of sodium in one serving of the food. If you eat more than one serving, you must multiply the listed amount of sodium by the number of servings.  Choose foods with less than 140 mg of sodium per serving.  Avoid foods with 300 mg of sodium or more per serving. Shopping  Look for lower-sodium products, often labeled as "low-sodium" or "no salt added."  Always check the sodium content even if foods are labeled as "unsalted" or "no salt added".  Buy fresh foods. ? Avoid canned foods and premade or frozen meals. ? Avoid canned, cured, or processed meats  Buy breads that have less than 80 mg of sodium per slice. Cooking  Eat more home-cooked food and less restaurant, buffet, and fast food.  Avoid adding salt when cooking. Use salt-free seasonings or herbs instead of table salt or sea salt. Check with your health care provider or pharmacist before using salt substitutes.  Cook with plant-based oils, such as canola, sunflower, or olive oil. Meal planning  When eating at a restaurant, ask that your food be prepared with less salt or no salt, if possible.  Avoid foods that contain MSG (monosodium glutamate). MSG is sometimes added to [place] food, bouillon, and some canned foods. What foods are recommended? The items listed may not be a complete list. Talk with your dietitian about what dietary choices are best for you. Grains Low-sodium cereals,  including oats, puffed wheat and rice, and shredded wheat. Low-sodium crackers. Unsalted rice. Unsalted pasta. Low-sodium bread. Whole-grain breads and whole-grain pasta. Vegetables Fresh or frozen vegetables. "No salt added" canned vegetables. "No salt added" tomato sauce and paste. Low-sodium or reduced-sodium tomato and vegetable juice. Fruits Fresh, frozen, or canned fruit. Fruit juice. Meats and other protein  foods Fresh or frozen (no salt added) meat, poultry, seafood, and fish. Low-sodium canned tuna and salmon. Unsalted nuts. Dried peas, beans, and lentils without added salt. Unsalted canned beans. Eggs. Unsalted nut butters. Dairy Milk. Soy milk. Cheese that is naturally low in sodium, such as ricotta cheese, fresh mozzarella, or Swiss cheese Low-sodium or reduced-sodium cheese. Cream cheese. Yogurt. Fats and oils Unsalted butter. Unsalted margarine with no trans fat. Vegetable oils such as canola or olive oils. Seasonings and other foods Fresh and dried herbs and spices. Salt-free seasonings. Low-sodium mustard and ketchup. Sodium-free salad dressing. Sodium-free light mayonnaise. Fresh or refrigerated horseradish. Lemon juice. Vinegar. Homemade, reduced-sodium, or low-sodium soups. Unsalted popcorn and pretzels. Low-salt or salt-free chips. What foods are not recommended? The items listed may not be a complete list. Talk with your dietitian about what dietary choices are best for you. Grains Instant hot cereals. Bread stuffing, pancake, and biscuit mixes. Croutons. Seasoned rice or pasta mixes. Noodle soup cups. Boxed or frozen macaroni and cheese. Regular salted crackers. Self-rising flour. Vegetables Sauerkraut, pickled vegetables, and relishes. Olives. [place] fries. Onion rings. Regular canned vegetables (not low-sodium or reduced-sodium). Regular canned tomato sauce and paste (not low-sodium or reduced-sodium). Regular tomato and vegetable juice (not low-sodium or reduced-sodium). Frozen vegetables in sauces. Meats and other protein foods Meat or fish that is salted, canned, smoked, spiced, or pickled. Bacon, ham, sausage, hotdogs, corned beef, chipped beef, packaged lunch meats, salt pork, jerky, pickled herring, anchovies, regular canned tuna, sardines, salted nuts. Dairy Processed cheese and cheese spreads. Cheese curds. Blue cheese. Feta cheese. String cheese. Regular cottage cheese.  Buttermilk. Canned milk. Fats and oils Salted butter. Regular margarine. Ghee. Bacon fat. Seasonings and other foods Onion salt, garlic salt, seasoned salt, table salt, and sea salt. Canned and packaged gravies. Worcestershire sauce. Tartar sauce. Barbecue sauce. Teriyaki sauce. Soy sauce, including reduced-sodium. Steak sauce. Fish sauce. Oyster sauce. Cocktail sauce. Horseradish that you find on the shelf. Regular ketchup and mustard. Meat flavorings and tenderizers. Bouillon cubes. Hot sauce and Tabasco sauce. Premade or packaged marinades. Premade or packaged taco seasonings. Relishes. Regular salad dressings. Salsa. Potato and tortilla chips. Corn chips and puffs. Salted popcorn and pretzels. Canned or dried soups. Pizza. Frozen entrees and pot pies. Summary  Eating less sodium can help lower your blood pressure, reduce swelling, and protect your heart, liver, and kidneys.  Most people on this plan should limit their sodium intake to 1,500-2,000 mg (milligrams) of sodium each day.  Canned, boxed, and frozen foods are high in sodium. Restaurant foods, fast foods, and pizza are also very high in sodium. You also get sodium by adding salt to food.  Try to cook at home, eat more fresh fruits and vegetables, and eat less fast food, canned, processed, or prepared foods. This information is not intended to replace advice given to you by your health care provider. Make sure you discuss any questions you have with your health care provider. Document Released: 04/01/2002 Document Revised: 10/03/2016 Document Reviewed: 10/03/2016 Elsevier Interactive Patient Education  [employer].      If you need a refill on your cardiac medications before your next  appointment, please call your pharmacy.

## 2017-10-30 NOTE — Progress Notes (Signed)
Cardiology Office Note    Date:  10/30/2017   ID:  KRISTIAN HAZZARD, DOB 08-29-1930, MRN [id number]  PCP:  Patient, No Pcp Per  Cardiologist: [name]  Chief Complaint  Patient presents with  . Shortness of Breath    History of Present Illness:  Sara Griffin is a 84 y.o. female with history of chronic atrial fib on coumadin for CHADSVASC=4, HTN, HLD, carotid artery stenosis. Echo 09/2016 normal LVEF, severely dilated LA and mild pulm HTN. Saw [name] 09/06/17 complaining of shortness of breath and edema. Lasix 20 mg daily was started by primary care and she lost 6 lbs. Lasix 20 mg daily was continued and prn 40 mg. Baseline weight 120 lbs Crt 1.050 .BNP 3519.  Patient comes in today accompanied by her son.  She had a terrible week last week.  She complained of orthopnea and worsening leg edema.  She is now taking Lasix 40 mg every day.  She also says she is her heart has been going crazy and she feels her whole body shaking with her A. fib.  She has not been weighing herself daily but says her weight is back to baseline.  She has not wearing compression stockings.  She says she is very strict about her diet and following a low-sodium diet.  She got more sweets over the holidays than she should have but does not think she got extra sodium.    Past Medical History:  Diagnosis Date  . Atrial fibrillation ([place])   . Hx of colonic polyps   . Hyperlipidemia   . Hypertension   . Uterine cancer [facility])     Past Surgical History:  Procedure Laterality Date  . ABDOMINAL HYSTERECTOMY    . PARTIAL COLECTOMY      Current Medications: Current Meds  Medication Sig  . losartan (COZAAR) 25 MG tablet TAKE 1 TABLET(25 MG) BY MOUTH DAILY  . warfarin (JANTOVEN) 5 MG tablet Take 5 mg by mouth daily.  . [DISCONTINUED] diltiazem (CARDIZEM CD) 120 MG 24 hr capsule Take 1 capsule (120 mg total) daily by mouth.  . [DISCONTINUED] furosemide (LASIX) 20 MG tablet Take 1 tablet (20 mg total) daily  by mouth.     Allergies:   Hydrocod polst-cpm polst er   Social History   Socioeconomic History  . Marital status: Married    Spouse name: None  . Number of children: 5  . Years of education: None  . Highest education level: None  Social Needs  . Financial resource strain: None  . Food insecurity - worry: None  . Food insecurity - inability: None  . Transportation needs - medical: None  . Transportation needs - non-medical: None  Occupational History  . None  Tobacco Use  . Smoking status: Never Smoker  . Smokeless tobacco: Never Used  Substance and Sexual Activity  . Alcohol use: No  . Drug use: No  . Sexual activity: None  Other Topics Concern  . None  Social History Narrative  . None     Family History:  The patient's family history includes Heart attack (age of onset: 58) in her son; Heart attack (age of onset: 35) in her father; Heart disease in her unknown relative.   ROS:   Please see the history of present illness.    Review of Systems  Constitution: Negative.  HENT: Negative.   Eyes: Negative.   Cardiovascular: Positive for dyspnea on exertion, irregular heartbeat and leg swelling.  Respiratory: Positive for  shortness of breath.   Hematologic/Lymphatic: Negative.   Musculoskeletal: Negative.  Negative for joint pain.  Gastrointestinal: Negative.   Genitourinary: Negative.   Neurological: Negative.    All other systems reviewed and are negative.   PHYSICAL EXAM:   VS:  BP (!) 142/80   Pulse 77   Ht _0  (1.575 m)   Wt 124 lb (56.2 kg)   SpO2 98%   BMI 22.68 kg/m   Physical Exam  GEN: Well nourished, well developed, in no acute distress  Neck: Slight increase JVD, no carotid bruits, or masses Cardiac: Irregular irregular at 90 bpm Respiratory: Decreased breath sounds without rales GI: soft, nontender, nondistended, + BS Ext: +1 ankle edema bilaterally, this cyanosis, significant varicosities Neuro:  Alert and Oriented x 3 Psych: euthymic  mood, full affect  Wt Readings from Last 3 Encounters:  10/30/17 124 lb (56.2 kg)  09/06/17 120 lb (54.4 kg)  03/01/17 123 lb (55.8 kg)      Studies/Labs Reviewed:   EKG:  EKG is not ordered today.    Recent Labs: 09/06/2017: BUN 28; Creatinine, Ser 1.05; Hemoglobin 14.9; NT-Pro BNP 3,519; Platelets 173; Potassium 4.3; Sodium 142   Lipid Panel No results found for: CHOL, TRIG, HDL, CHOLHDL, VLDL, LDLCALC, LDLDIRECT  Additional studies/ records that were reviewed today include:   Echo 09/2016 Study Conclusions   - Left ventricle: The cavity size was normal. Wall thickness was   increased in a pattern of mild LVH. Indeterminant diastolic   function (atrial fibrillation). Systolic function was normal. The   estimated ejection fraction was in the range of 60% to 65%. Wall   motion was normal; there were no regional wall motion   abnormalities. - Aortic valve: There was no stenosis. There was trivial   regurgitation. - Mitral valve: Mildly calcified annulus. There was trivial   regurgitation. - Left atrium: The atrium was moderately to severely dilated. - Right ventricle: The cavity size was mildly dilated. Systolic   function was normal. - Right atrium: The atrium was mildly dilated. - Tricuspid valve: Peak RV-RA gradient (S): 31 mm Hg. - Pulmonary arteries: PA peak pressure: 39 mm Hg (S). - Systemic veins: IVC measured 1.9 cm with < 50% respirophasic   variation, suggesting RA pressure 8 mmHg.   Impressions:   - The patient was in atrial fibrillation. Normal LV size with mild   LV hypertrophy. EF 60-65%. Mildly dilated RV with normal systolic   function. Biatrial enlargement. Mild pulmonary hypertension.   Nuclear stress test m) 130 lb (59 kg) 22.4  Study Highlights    Nuclear stress EF: 57%.  Defect 1: There is a medium defect of mild severity present in the mid inferoseptal location.   Low risk stress nuclear study with inferoseptal thinning; no ischemia; EF  57 with normal wall motion.        ASSESSMENT:    1. Acute on chronic diastolic CHF (congestive heart failure) ([place])   2. Chronic atrial fibrillation (HCC)   3. Labile hypertension      PLAN:  In order of problems listed above:  Acute on chronic diastolic CHF.  Will check be met and BNP today.  Continue Lasix 40 mg daily.  Check 2D echo to reassess LV function and diastolic function.  I think some of her symptoms may be coming from rapid atrial fibrillation.  Just getting up on the exam table her heart rate jumped up to 90.  Increase diltiazem to 240 mg daily.  Follow-up  with me in 3-4 weeks.  Recommend compression stockings and 2 g sodium diet.  Atrial fibrillation on Coumadin.  Patient is only on 120 mg of diltiazem.  She says she feels her whole body shaking sometimes with her heart racing.  We walked her short distance in the office and her heart rate went from 70-93 pretty quickly.  Will increase diltiazem to 240 mg daily.  Labile hypertension.  We have room to increase her medications.  Medication Adjustments/Labs and Tests Ordered: Current medicines are reviewed at length with the patient today.  Concerns regarding medicines are outlined above.  Medication changes, Labs and Tests ordered today are listed in the Patient Instructions below. Patient Instructions  Medication Instructions:  Increase lasix (furosemide) to 40 mg daily.  Increase Diltiazem CD to 240 mg daily  Labwork: BMET/BNP today.  Testing/Procedures: Your physician has requested that you have an echocardiogram. Echocardiography is a painless test that uses sound waves to create images of your heart. It provides your doctor with information about the size and shape of your heart and how well your heart's chambers and valves are working. This procedure takes approximately one hour. There are no restrictions for this procedure.    Follow-Up: Your physician recommends that you schedule a follow-up appointment  in: 3-4 weeks with Ermalinda Barrios, PA.  Your physician recommends that you schedule a follow-up appointment in: April 2019 with Dr [name].  Use knee high compression stockings to help the swelling in your feet and legs. Put them on in the morning and take them off at night.  Your physician recommends that you weigh, daily, at the same time every day, and in the same amount of clothing. Please record your daily weights and bring it to your next appointment.       Low-Sodium Eating Plan Sodium, which is an element that makes up salt, helps you maintain a healthy balance of fluids in your body. Too much sodium can increase your blood pressure and cause fluid and waste to be held in your body. Your health care provider or dietitian may recommend following this plan if you have high blood pressure (hypertension), kidney disease, liver disease, or heart failure. Eating less sodium can help lower your blood pressure, reduce swelling, and protect your heart, liver, and kidneys. What are tips for following this plan? General guidelines  --Limit your sodium intake to 2,000 mg (milligrams) of sodium each day. Reading food labels  The Nutrition Facts label lists the amount of sodium in one serving of the food. If you eat more than one serving, you must multiply the listed amount of sodium by the number of servings.  Choose foods with less than 140 mg of sodium per serving.  Avoid foods with 300 mg of sodium or more per serving. Shopping  Look for lower-sodium products, often labeled as "low-sodium" or "no salt added."  Always check the sodium content even if foods are labeled as "unsalted" or "no salt added".  Buy fresh foods. ? Avoid canned foods and premade or frozen meals. ? Avoid canned, cured, or processed meats  Buy breads that have less than 80 mg of sodium per slice. Cooking  Eat more home-cooked food and less restaurant, buffet, and fast food.  Avoid adding salt when cooking.  Use salt-free seasonings or herbs instead of table salt or sea salt. Check with your health care provider or pharmacist before using salt substitutes.  Cook with plant-based oils, such as canola, sunflower, or olive oil. Meal planning  When eating at a restaurant, ask that your food be prepared with less salt or no salt, if possible.  Avoid foods that contain MSG (monosodium glutamate). MSG is sometimes added to [place] food, bouillon, and some canned foods. What foods are recommended? The items listed may not be a complete list. Talk with your dietitian about what dietary choices are best for you. Grains Low-sodium cereals, including oats, puffed wheat and rice, and shredded wheat. Low-sodium crackers. Unsalted rice. Unsalted pasta. Low-sodium bread. Whole-grain breads and whole-grain pasta. Vegetables Fresh or frozen vegetables. "No salt added" canned vegetables. "No salt added" tomato sauce and paste. Low-sodium or reduced-sodium tomato and vegetable juice. Fruits Fresh, frozen, or canned fruit. Fruit juice. Meats and other protein foods Fresh or frozen (no salt added) meat, poultry, seafood, and fish. Low-sodium canned tuna and salmon. Unsalted nuts. Dried peas, beans, and lentils without added salt. Unsalted canned beans. Eggs. Unsalted nut butters. Dairy Milk. Soy milk. Cheese that is naturally low in sodium, such as ricotta cheese, fresh mozzarella, or Swiss cheese Low-sodium or reduced-sodium cheese. Cream cheese. Yogurt. Fats and oils Unsalted butter. Unsalted margarine with no trans fat. Vegetable oils such as canola or olive oils. Seasonings and other foods Fresh and dried herbs and spices. Salt-free seasonings. Low-sodium mustard and ketchup. Sodium-free salad dressing. Sodium-free light mayonnaise. Fresh or refrigerated horseradish. Lemon juice. Vinegar. Homemade, reduced-sodium, or low-sodium soups. Unsalted popcorn and pretzels. Low-salt or salt-free chips. What foods are not  recommended? The items listed may not be a complete list. Talk with your dietitian about what dietary choices are best for you. Grains Instant hot cereals. Bread stuffing, pancake, and biscuit mixes. Croutons. Seasoned rice or pasta mixes. Noodle soup cups. Boxed or frozen macaroni and cheese. Regular salted crackers. Self-rising flour. Vegetables Sauerkraut, pickled vegetables, and relishes. Olives. [place] fries. Onion rings. Regular canned vegetables (not low-sodium or reduced-sodium). Regular canned tomato sauce and paste (not low-sodium or reduced-sodium). Regular tomato and vegetable juice (not low-sodium or reduced-sodium). Frozen vegetables in sauces. Meats and other protein foods Meat or fish that is salted, canned, smoked, spiced, or pickled. Bacon, ham, sausage, hotdogs, corned beef, chipped beef, packaged lunch meats, salt pork, jerky, pickled herring, anchovies, regular canned tuna, sardines, salted nuts. Dairy Processed cheese and cheese spreads. Cheese curds. Blue cheese. Feta cheese. String cheese. Regular cottage cheese. Buttermilk. Canned milk. Fats and oils Salted butter. Regular margarine. Ghee. Bacon fat. Seasonings and other foods Onion salt, garlic salt, seasoned salt, table salt, and sea salt. Canned and packaged gravies. Worcestershire sauce. Tartar sauce. Barbecue sauce. Teriyaki sauce. Soy sauce, including reduced-sodium. Steak sauce. Fish sauce. Oyster sauce. Cocktail sauce. Horseradish that you find on the shelf. Regular ketchup and mustard. Meat flavorings and tenderizers. Bouillon cubes. Hot sauce and Tabasco sauce. Premade or packaged marinades. Premade or packaged taco seasonings. Relishes. Regular salad dressings. Salsa. Potato and tortilla chips. Corn chips and puffs. Salted popcorn and pretzels. Canned or dried soups. Pizza. Frozen entrees and pot pies. Summary  Eating less sodium can help lower your blood pressure, reduce swelling, and protect your heart, liver,  and kidneys.  Most people on this plan should limit their sodium intake to 1,500-2,000 mg (milligrams) of sodium each day.  Canned, boxed, and frozen foods are high in sodium. Restaurant foods, fast foods, and pizza are also very high in sodium. You also get sodium by adding salt to food.  Try to cook at home, eat more fresh fruits and vegetables, and eat less fast food, canned,  processed, or prepared foods. This information is not intended to replace advice given to you by your health care provider. Make sure you discuss any questions you have with your health care provider. Document Released: 04/01/2002 Document Revised: 10/03/2016 Document Reviewed: 10/03/2016 Elsevier Interactive Patient Education  [employer].      If you need a refill on your cardiac medications before your next appointment, please call your pharmacy.      Signed, Ermalinda Barrios, PA-C  10/30/2017 9:20 AM    [facility] [address], [place], [place]  [id number] Phone: [phone]; Fax: [phone]

## 2017-10-31 ENCOUNTER — Other Ambulatory Visit: Payer: Self-pay | Admitting: Physician Assistant

## 2017-10-31 ENCOUNTER — Telehealth: Payer: Self-pay | Admitting: *Deleted

## 2017-10-31 DIAGNOSIS — Z79899 Other long term (current) drug therapy: Secondary | ICD-10-CM

## 2017-10-31 MED ORDER — FUROSEMIDE 20 MG PO TABS: ORAL_TABLET | ORAL | 3 refills | Status: DC

## 2017-10-31 NOTE — Telephone Encounter (Signed)
-----   Message from Imogene Burn, PA-C sent at 10/31/2017  8:00 AM EST ----- Heart failure marker up in kidney function up.  Increase Lasix to 60 mg daily for 3 days then back to 40 mg daily.  Continue to weigh herself daily.  Recheck renal function and BNP in 2 weeks.

## 2017-11-10 ENCOUNTER — Encounter: Payer: Self-pay | Admitting: Physician Assistant

## 2017-11-10 ENCOUNTER — Other Ambulatory Visit: Payer: Self-pay

## 2017-11-10 ENCOUNTER — Other Ambulatory Visit: Payer: Medicare Other | Admitting: *Deleted

## 2017-11-10 ENCOUNTER — Ambulatory Visit (HOSPITAL_COMMUNITY): Payer: Medicare Other | Attending: Cardiology

## 2017-11-10 DIAGNOSIS — I482 Chronic atrial fibrillation, unspecified: Secondary | ICD-10-CM

## 2017-11-10 DIAGNOSIS — I5033 Acute on chronic diastolic (congestive) heart failure: Secondary | ICD-10-CM | POA: Insufficient documentation

## 2017-11-10 DIAGNOSIS — R0989 Other specified symptoms and signs involving the circulatory and respiratory systems: Secondary | ICD-10-CM

## 2017-11-10 DIAGNOSIS — Z79899 Other long term (current) drug therapy: Secondary | ICD-10-CM

## 2017-11-10 DIAGNOSIS — I429 Cardiomyopathy, unspecified: Secondary | ICD-10-CM | POA: Insufficient documentation

## 2017-11-10 DIAGNOSIS — I110 Hypertensive heart disease with heart failure: Secondary | ICD-10-CM | POA: Diagnosis not present

## 2017-11-10 DIAGNOSIS — I083 Combined rheumatic disorders of mitral, aortic and tricuspid valves: Secondary | ICD-10-CM | POA: Insufficient documentation

## 2017-11-10 LAB — BASIC METABOLIC PANEL
BUN / CREAT RATIO: 25 (ref 12–28)
BUN: 29 mg/dL — ABNORMAL HIGH (ref 8–27)
CALCIUM: 9 mg/dL (ref 8.7–10.3)
CO2: 25 mmol/L (ref 20–29)
Chloride: 101 mmol/L (ref 96–106)
Creatinine, Ser: 1.14 mg/dL — ABNORMAL HIGH (ref 0.57–1.00)
GFR calc Af Amer: 51 mL/min/{1.73_m2} — ABNORMAL LOW (ref >59)
GFR, EST NON AFRICAN AMERICAN: 44 mL/min/{1.73_m2} — AB (ref >59)
Glucose: 80 mg/dL (ref 65–99)
Potassium: 5 mmol/L (ref 3.5–5.2)
SODIUM: 142 mmol/L (ref 134–144)

## 2017-11-10 LAB — PRO B NATRIURETIC PEPTIDE: NT-Pro BNP: 3208 pg/mL — ABNORMAL HIGH (ref 0–738)

## 2017-11-12 ENCOUNTER — Encounter: Payer: Self-pay | Admitting: Physician Assistant

## 2017-11-12 NOTE — Progress Notes (Addendum)
Cardiology Office Note    Date:  11/13/2017  ID:  Sara Griffin, DOB 1930/08/06, MRN [id number] PCP:  Algis Greenhouse, MD  Cardiologist:  [name]   Chief Complaint: f/u CHF  History of Present Illness:  Sara Griffin is a 84 y.o. female with history of chronic atrial fib on coumadin for CHADSVASC=4, HTN, HLD, carotid artery stenosis, and recent acute systolic CHF, moderate MR/TR, possible CKD stage III who presents for f/u CHF.  She has been followed for CHF symptoms recently. She was seen by [name] in 08/2017 with SOB and edema. She was started on Lasix 48m (to use 412mdaily when weight goes up by 3lb overnight). When seen in followup by [name]/7/19 she complained of worsened orthopnea, edema, and palpitations in the setting of Lasix 4031maily. HR was in the 70s but diltiazem was increased to 240m65mily in case this represented increased rates. BNP remained high at 4473, prompting increase in Lasix to 60mg49mly x 3 days then back to 40mg 70my. Otherwise BUN 29/Cr 1.22, K 5.0 (previously 0.8-1.1 in 2018). Last CBC in 08/2017 was normal. Repeat labs 1/18 showed BNP 3208, BUN 29, Cr 1.14, K 5.0. Echo 11/10/17 showed mild LVH, EF 40-45%, dyskinesis of anteroseptal myocardium, moderate mitral regurgitation, severely dilated LA/RA, moderate TR. Other studies reviewed include carotid duplex 2014 with 0-39% stenosis and nuclear stress test 02/2016 which was negative for ischemia.  She returns for follow-up overall feeling much better. Weight has remained stable at home down to around 118-119, rarely fluctuating more than a lb or two. It is down 1lb by our scales but she also has winter gear on today. Her breathing feels back to baseline. Edema comes and goes but seems better today. She is back to ADLs including vacuuming. She does not exercise regularly but she and her son agree she stays active. No chest pain.    Past Medical History:  Diagnosis Date  . Cardiomy[place] ([facility])  [place] mild [place]H, EF 40-45, ant-sept DK, mild AI, MAC, mod MR, severe BAE, mod TR  . Chronic atrial fibrillation ([facility])  [place] ([place] ([facility]  [place]. dx 10/2017 but sx dating back to 08/2017.  . CKD (chronic kidney disease), stage III (HCC)  [place]. ? CKD - baseline Cr 0.8-1.1.  . Hx of colonic polyps   . Hyperlipidemia   . Hypertension   . Mild carotid artery disease (HCC)  [place]. Duplex 2014 mild carotid plaque 0-39% BICA.  . ModeMarland Kitchenate mitral regurgitation 10/2017  . Moderate tricuspid regurgitation 10/2017  . [facility]:  Procedure Laterality Date  . ABDOMINAL HYSTERECTOMY    . PARTIAL COLECTOMY      Current Medications: Current Meds  Medication Sig  . Cholecalciferol (VITAMIN D3) 5000 units TABS Take by mouth.  . losartan (COZAAR) 25 MG tablet TAKE 1 TABLET(25 MG) BY MOUTH DAILY  . warfarin (JANTOVEN) 5 MG tablet 5-2.5-2.5  . diltiazem (CARDIZEM CD) 240 MG 24 hr capsule Take 1 capsule (240 mg total) by mouth daily.  . furosemide (LASIX) 20 MG tablet Take 3 tablets by mouth daily for 3 days then go back down to 2 tablets by mouth daily  . [DISCONTINUED] warfarin (JANTOVEN) 5 MG tablet Take 5 mg by mouth daily.   Allergies:   Hydrocod polst-cpm polst er   Social History   Socioeconomic  History  . Marital status: Married    Spouse name: None  . Number of children: 5  . Years of education: None  . Highest education level: None  Social Needs  . Financial resource strain: None  . Food insecurity - worry: None  . Food insecurity - inability: None  . Transportation needs - medical: None  . Transportation needs - non-medical: None  Occupational History  . None  Tobacco Use  . Smoking status: Never Smoker  . Smokeless tobacco: Never Used  Substance and Sexual Activity  . Alcohol use: No  . Drug use: No  . Sexual activity: None  Other Topics Concern  . None  Social History Narrative  . None      Family History:  Family History  Problem Relation Age of Onset  . Heart attack Father 108       died  . Heart disease Unknown        famil,y hx of  . Heart attack Son 62       died     ROS:   Please see the history of present illness.  All other systems are reviewed and otherwise negative.    PHYSICAL EXAM:   VS:  BP 132/82   Pulse 84   Resp 16   Ht 5' 2"  (1.575 m)   Wt 123 lb 12.8 oz (56.2 kg)   SpO2 97%   BMI 22.64 kg/m   BMI: Body mass index is 22.64 kg/m. GEN: Well nourished, well developed elderly WF, in no acute distress  HEENT: normocephalic, atraumatic Neck: no JVD, carotid bruits, or masses Cardiac: irregularly irregular, rate controlled, no murmurs, rubs, or gallops, no edema  Respiratory:  clear to auscultation bilaterally, normal work of breathing GI: soft, nontender, nondistended, + BS MS: no deformity or atrophy  Skin: warm and dry, no rash Neuro:  Alert and Oriented x 3, Strength and sensation are intact, follows commands Psych: euthymic mood, full affect  Wt Readings from Last 3 Encounters:  11/13/17 123 lb 12.8 oz (56.2 kg)  10/30/17 124 lb (56.2 kg)  09/06/17 120 lb (54.4 kg)      Studies/Labs Reviewed:   EKG:  EKG was ordered today and personally reviewed by me and demonstrates atrial fib 84bpm, septla infarct age undetermined, nonspecific ST-T changes, with TW flattening/possible inversion in III, TWI V6. No sig change from prior.  Recent Labs: 09/06/2017: Hemoglobin 14.9; Platelets 173 11/10/2017: BUN 29; Creatinine, Ser 1.14; NT-Pro BNP 3,208; Potassium 5.0; Sodium 142   Lipid Panel No results found for: CHOL, TRIG, HDL, CHOLHDL, VLDL, LDLCALC, LDLDIRECT  Additional studies/ records that were reviewed today include: Summarized above.    ASSESSMENT & PLAN:   1. Acute on chronic systolic CHF - patient symptomatically improved on higher maintenance dose of Lasix with brief burst dosing. She states she continues to diurese well on this  dose. BNP on 1/18 was still elevated but down from recent values. She also has CKD which could be affecting clearance. Typically further workup would include cardiac cath to exclude ischemic etiology and further workup of valvular disease; however, in talking with the patient it is clear that she wishes to avoid invasive procedures and focus on symptom management instead which is certainly reasonable given her age, low body weight, and renal disease. Given low EF, diltiazem is less ideal as it can exacerbate HF symptoms, therefore, will d/c diltiazem and change to metoprolol 9m daily (reviewed dose equivalency with Dr. NJohnsie Cancelin Dr. NFrancesca Omanabsence).  We did tell her that if she begins to feel like her heart rate is jumping around more with atrial fib, she can increase the metoprolol to 1.5 tablets daily and let us know. Reviewed 2g sodium restriction, 2L fluid restriction, daily weights with patient. Will keep Lasix dose at 46m daily but advised she may take an extra 257mdaily for weight gain of 2lbs, increased shortness of breath, or swelling. Spironolactone could be considered at next visit versus titration of BB/ARB. 2. Chronic atrial fib - initial rate in the 50s per tech, was 84 by EKG. Follow with transition to Toprol. Continue Coumadin. She has considered NOACs in the past but is hesitant to make any switches. She will continue to contemplate. INR is followed in [place] by Dr. DoGarlon HatchetAs she has not had thyroid function since 2017 will update TSH and free T4. She does report h/o hypothyroidism in the past but has not been on meds in recent years. 3. Renal insufficiency (suspect CKD III) - appeared relatively stable by f/u labs, new baseline with diuretic on board may be between 1-1.2. Estimated Creatinine Clearance: 28.5 mL/min (A) (by C-G formula based on SCr of 1.14 mg/dL (H)). 4. Mitral regurgitation/tricuspid regurgitation - see above. Will follow conservatively for now.  Disposition: F/u with  MiErmalinda Barrioss previously scheduled 11/27/17 to assess rate control.   Medication Adjustments/Labs and Tests Ordered: Current medicines are reviewed at length with the patient today.  Concerns regarding medicines are outlined above. Medication changes, Labs and Tests ordered today are summarized above and listed in the Patient Instructions accessible in Encounters.   Signed, DaCharlie PitterPA-C  11/13/2017 9:12 AM    [facility] [address]  [id number]: [phone]: [phone]

## 2017-11-13 ENCOUNTER — Other Ambulatory Visit (HOSPITAL_COMMUNITY): Payer: Medicare Other

## 2017-11-13 ENCOUNTER — Encounter: Payer: Self-pay | Admitting: Physician Assistant

## 2017-11-13 ENCOUNTER — Ambulatory Visit (INDEPENDENT_AMBULATORY_CARE_PROVIDER_SITE_OTHER): Payer: Medicare Other | Admitting: Physician Assistant

## 2017-11-13 ENCOUNTER — Other Ambulatory Visit: Payer: Self-pay | Admitting: Physician Assistant

## 2017-11-13 VITALS — BP 132/82 | HR 84 | Resp 16 | Ht 62.0 in | Wt 123.8 lb

## 2017-11-13 DIAGNOSIS — I482 Chronic atrial fibrillation, unspecified: Secondary | ICD-10-CM

## 2017-11-13 DIAGNOSIS — I071 Rheumatic tricuspid insufficiency: Secondary | ICD-10-CM

## 2017-11-13 DIAGNOSIS — I340 Nonrheumatic mitral (valve) insufficiency: Secondary | ICD-10-CM

## 2017-11-13 DIAGNOSIS — N183 Chronic kidney disease, stage 3 unspecified: Secondary | ICD-10-CM

## 2017-11-13 DIAGNOSIS — I5023 Acute on chronic systolic (congestive) heart failure: Secondary | ICD-10-CM | POA: Diagnosis not present

## 2017-11-13 LAB — T4, FREE: Free T4: 0.93 ng/dL (ref 0.82–1.77)

## 2017-11-13 LAB — TSH: TSH: 5.53 u[IU]/mL — AB (ref 0.450–4.500)

## 2017-11-13 MED ORDER — FUROSEMIDE 20 MG PO TABS: ORAL_TABLET | ORAL | 3 refills | Status: DC

## 2017-11-13 MED ORDER — METOPROLOL SUCCINATE ER 50 MG PO TB24: 50.0000 mg | ORAL_TABLET | Freq: Every day | ORAL | 1 refills | Status: DC

## 2017-11-13 NOTE — Patient Instructions (Addendum)
Medication Instructions:  Your physician has recommended you make the following change in your medication:  1.  STOP the Diltiazem 2.  START Toprol Xl 50 mg taking 1 tablet daily 3.  CHANGE the Lasix to 20 mg taking 2 tablets daily with the ok with taking an extra tablet daily only as needed for weight gain of 2 lbs or shortness of breath   If you feel your afib is jumping around more with the medication adjustment, you may increase the Metoprolol to 1 1/2 tablet a day and call to let us know.  Labwork: TODAY:  TSH & FREE T4  Testing/Procedures: None ordered  Follow-Up: Your physician recommends that you schedule a follow-up appointment in: [place] ON 11/27/17   Any Other Special Instructions Will Be Listed Below (If Applicable).     If you need a refill on your cardiac medications before your next appointment, please call your pharmacy.

## 2017-11-16 ENCOUNTER — Telehealth: Payer: Self-pay | Admitting: Physician Assistant

## 2017-11-16 NOTE — Telephone Encounter (Signed)
Returned call to patient. Patient c/o fatigue and mild sob since she started taking toprol 50 mg once a day. Informed patient that fatigue could be a side effect of the med, but encouraged patient that its only been 3 days since she started med. Encouraged patient to continue taking till follow up appt of 2/4 and see if the symptoms improve. Patient in agreement with plan. She states she will call back if symptoms don't improve. Informed patient I will forward to Melina Copa, PA for further recommendations. Patient verbalized understanding and thanked me for the call.

## 2017-11-16 NOTE — Telephone Encounter (Signed)
New message    Patient calling with concerns about shortness of breath. Please call   Pt c/o Shortness Of Breath: STAT if SOB developed within the last 24 hours or pt is noticeably SOB on the phone  1. Are you currently SOB (can you hear that pt is SOB on the phone)? A "tiny bit" per patient  2. How long have you been experiencing SOB? Since she started taking metoprolol succinate (TOPROL-XL) 50 MG 24 hr tablet on 1/21  3. Are you SOB when sitting or when up moving around? Mostly when moving around  4. Are you currently experiencing any other symptoms? No     Name of Medication:metoprolol succinate (TOPROL-XL) 50 MG 24 hr tablet   How are you currently taking this medication (dosage and times per day)? As prescribed  Are you having a reaction (difficulty breathing--STAT)? SOB   What is your medication issue? wants to know if metoprolol succinate (TOPROL-XL) 50 MG 24 hr tablet could be the cause of shortness of breath and fatigue

## 2017-11-17 NOTE — Telephone Encounter (Signed)
Spoke with the pt and informed her of Dayna's recommendations.  Asked the pt how she feels today, and she states she's feeling a bit better.  Asked the pt how her BP and HR has been running, and she reports BP's stay high 120's-130/80s and HR 90.  Pt reports her weight is at 119 lbs, which is the best its been in awhile, and she has no increased swelling.  Advised the pt to continue her current regimen, follow-up as planned, and I will route this information to [facility] as an [place].  Pt verbalized understanding and agrees with this plan.  Pt gracious for all the follow-up provided.

## 2017-11-17 NOTE — Telephone Encounter (Signed)
Left a message for the pt to call back.  

## 2017-11-17 NOTE — Telephone Encounter (Signed)
I agree with plan of seeing how she feels with a few days worth, can you also please find out what heart rate and blood pressure are running? Also, any change in weight at home or swelling? Thank you!  Avya Flavell PA-C

## 2017-11-20 ENCOUNTER — Telehealth: Payer: Self-pay | Admitting: Cardiology

## 2017-11-20 MED ORDER — METOPROLOL SUCCINATE ER 50 MG PO TB24: ORAL_TABLET | ORAL | 3 refills | Status: DC

## 2017-11-20 MED ORDER — METOPROLOL SUCCINATE ER 50 MG PO TB24: 50.0000 mg | ORAL_TABLET | Freq: Every day | ORAL | 3 refills | Status: DC

## 2017-11-20 NOTE — Telephone Encounter (Signed)
Spoke with the pt and the pts son and they are both very concerned about the pt increasing her metoprolol xl to 1.5 tablets po daily.  Son voiced that since the pt came off of diltiazem and started on Toprol XL, she has completely been "wiped out." Son states she doesn't even want to get up and dress herself accordingly, and son states the pt always "worries about her appearance." Son states that they will continue giving the pt Toprol XL 50 mg po daily, until otherwise advised, but refuse to increase the pts BB, due to intolerance.  Son would like for Dayna to advise on a different regimen for the pt.  Advised the pts son to have her continue taking her Toprol XL 50 mg po daily until otherwise advised.  Advised the pts son to have her continue weighing herself daily, and monitoring her BP and HR.  Informed the pts Son that I will route this message back to [facility] to review and advise on a different regimen, due to intolerance, and I will follow-up once further recommendations are provided. Son verbalized understanding and agrees with this plan.

## 2017-11-20 NOTE — Telephone Encounter (Deleted)
Notified the pt that per Melina Copa PA-C, if she notices any increased sob or heart rate jumping around, she can increase her Toprol XL to 1.5 tablets daily, as this may improve the time her heart has to fill with each beat.  Advised the pt to let us know if she has any issues.  Pt states she would like to go ahead and proceed with taking 1.5 tablets po daily.  Advised the pt to call the office back at the end of the week, if she's having any issues with this, or if symptoms continue.  Pt verbalized understanding and agrees with this plan.  Updated change in pts med list.

## 2017-11-20 NOTE — Addendum Note (Signed)
Addended by: Nuala Alpha on: 11/20/2017 05:32 PM   Modules accepted: Orders

## 2017-11-20 NOTE — Telephone Encounter (Signed)
Spoke with the pt and the pts son and they are both very concerned about the pt increasing her metoprolol xl to 1.5 tablets po daily.  Son voiced that since the pt came off of diltiazem and started on Toprol XL, she has completely been "wiped out." Son states she doesn't even want to get up and dress herself accordingly, and son states the pt always "worries about her appearance." Son states that they will continue giving the pt Toprol XL 50 mg po daily, until otherwise advised, but refuse to increase the pts BB, due to intolerance.  Son would like for Dayna to advise on a different regimen for the pt.  Advised the pts son to have her continue taking her Toprol XL 50 mg po daily until otherwise advised.  Advised the pts son to have her continue weighing herself daily, and monitoring her BP and HR.  Informed the pts Son that I will route this message back to [facility] to review and advise on a different regimen, due to intolerance, and I will follow-up once further recommendations are provided. Son verbalized understanding and agrees with this plan.

## 2017-11-20 NOTE — Telephone Encounter (Signed)
New Message    Patients son Devona Konig is calling on behalf of mother.  He states that his mother spoke with nurse about her medication. But the mother did not quite understand the change. Please call to discuss.

## 2017-11-20 NOTE — Telephone Encounter (Signed)
Thank you! Please remind Sara Griffin that if she does notice any increased shortness of breath or heart rate jumping around, she can increase Toprol to 1.5 tablets daily as this may improve the time her heart has to fill with each beat. It might be worth a trial to just go ahead and do this and let us know if she has any issues. Shalin Vonbargen PA-C

## 2017-11-21 MED ORDER — METOPROLOL SUCCINATE ER 50 MG PO TB24: ORAL_TABLET | ORAL | 3 refills | Status: DC

## 2017-11-21 NOTE — Telephone Encounter (Signed)
Correct. Toprol, not tartrate - 1.5 tablets daily (75mg ). Barba Solt PA-C

## 2017-11-21 NOTE — Telephone Encounter (Signed)
I will plan to review with [name] today when I see her in the hospital. Melina Copa PA-C

## 2017-11-21 NOTE — Telephone Encounter (Signed)
Notified the pts Son that per Dr [name] and Melina Copa PA-C, the pt would most benefit if she increases her Toprol XL to 75 mg (1.5 tablets) po daily, and follow her symptoms over the next few days.  Informed the pts Son that both state if she is still feeling wiped out at time of follow-up on 2/4, then they may consider going back to diltiazem, but long-term metoprolol really is the better choice given her low EF.  Informed the pts Son that it just may be a matter of trying to get the HR under better control. Advised the pts Son to make sure she is taking this med with meals, or following immediately after eating a meal. Advised the pts son to continue to monitor her HR and BP. Advised the pts son that he will need to take 1 whole 50 mg tablet, and 1/2 of the 50 mg tablet of Toprol XL, to get her the full dose of Toprol xl 75 mg po daily.  Son has agreed to this plan, and will endorse this to the pt. Son will help the pt on how to administer this med correctly, as far as with the dose increase.  Son request on holding off on refilling this med at this time, in case dose changes on 2/4 follow-up.  Son states when he went to visit with his mother today, she looked better, and was able to get up and around to doing her ADL's.  Son verbalized understanding and agrees with this plan. Son more than gracious for all the assistance provided.

## 2017-11-21 NOTE — Addendum Note (Signed)
Addended by: Nuala Alpha on: 11/21/2017 04:47 PM   Modules accepted: Orders

## 2017-11-21 NOTE — Telephone Encounter (Signed)
Hi Ivy, I talked to [name]. She shares the concern that maybe the current dose of metoprolol isn't quite controlling the heart rate as well as the diltiazem, and this may be why the patient is feeling wiped out. Sometimes it takes a little adjustment to find the right equivalent dose. [name] would recommend to increase metoprolol to 1.5 tablets daily (75mg ) and follow symptoms over the next few days. Ultimately if the patient is still feeling wiped out at time of follow up on 2/4, [name] says we can consider going back to diltiazem but long-term metoprolol really is the better choice given her low EF. It might just be a matter of trying to get the heart rate under better control.  Anthonyjames Bargar PA-C

## 2017-11-21 NOTE — Telephone Encounter (Signed)
Sara Griffin, just for clarification, you want her still on Toprol XL, take 1.5 tablets (75 mg) po daily, not tartrate?  Sorry, this son is very addiment about her metoprolol causing all her issues.  And her diltiazem was d/c'ed per last OV.  Still remain off of diltiazem right?

## 2017-11-27 ENCOUNTER — Ambulatory Visit (INDEPENDENT_AMBULATORY_CARE_PROVIDER_SITE_OTHER): Payer: Medicare Other | Admitting: Physician Assistant

## 2017-11-27 ENCOUNTER — Encounter: Payer: Self-pay | Admitting: Physician Assistant

## 2017-11-27 VITALS — BP 134/86 | HR 90 | Ht 65.0 in | Wt 122.0 lb

## 2017-11-27 DIAGNOSIS — I482 Chronic atrial fibrillation, unspecified: Secondary | ICD-10-CM

## 2017-11-27 DIAGNOSIS — I480 Paroxysmal atrial fibrillation: Secondary | ICD-10-CM

## 2017-11-27 DIAGNOSIS — I10 Essential (primary) hypertension: Secondary | ICD-10-CM | POA: Diagnosis not present

## 2017-11-27 DIAGNOSIS — I5042 Chronic combined systolic (congestive) and diastolic (congestive) heart failure: Secondary | ICD-10-CM

## 2017-11-27 DIAGNOSIS — I340 Nonrheumatic mitral (valve) insufficiency: Secondary | ICD-10-CM | POA: Diagnosis not present

## 2017-11-27 DIAGNOSIS — R5383 Other fatigue: Secondary | ICD-10-CM

## 2017-11-27 DIAGNOSIS — N183 Chronic kidney disease, stage 3 unspecified: Secondary | ICD-10-CM | POA: Insufficient documentation

## 2017-11-27 MED ORDER — METOPROLOL SUCCINATE ER 100 MG PO TB24: ORAL_TABLET | ORAL | 3 refills | Status: DC

## 2017-11-27 NOTE — Progress Notes (Signed)
Cardiology Office Note    Date:  11/27/2017   ID:  KODIE PICK, Alferd Apa 10-Apr-1930, MRN [id number]  PCP:  Algis Greenhouse, MD  Cardiologist: Ena Dawley, MD  Chief Complaint  Patient presents with  . Follow-up    History of Present Illness:  Sara Griffin is a 84 y.o. female  with history of chronic atrial fib on coumadin for CHADSVASC=4, HTN, HLD, carotid artery stenosis, and recent acute systolic CHF, moderate MR/TR, possible CKD stage III.  She's had recent trouble with CHF.  I saw her 10/30/17 with worsening orthopnea and edema and palpitations.  BNP was 4473 and I increased her Lasix and diltiazem to 240 mg daily in case of increased heart rates.  2 Decho 11/10/17 LVEF 40-45%, with moderate MR and TR.  She saw Sharrell Ku 11/13/17 and patient was feeling much better.  Weight was stable at 118 pounds.  Diltiazem was switched to Toprol because of low EF.  Patient comes in today accompanied by Her 2 sons.  She says she can be sitting on the commode and she can just feel her whole body vibrating from rapid heart rate.  She has days of extreme fatigue.  Friday she felt terrible most of the day and evening but today she feels great.  She knows her heart still races.  She weighs herself daily and days around 118 pounds.  TSH was mildly elevated at 5.53 which she sees her primary care about  next week.  She denies any caffeine use.  She has mild edema on occasion.  She only took one Lasix yesterday and today because of church and coming here.     Past Medical History:  Diagnosis Date  . Cardiomyopathy ([place])    Echo 1/19: mild LVH, EF 40-45, ant-sept DK, mild AI, MAC, mod MR, severe BAE, mod TR  . Chronic atrial fibrillation ([place])   . Chronic systolic CHF (congestive heart failure) ([place])    a. dx 10/2017 but sx dating back to 08/2017.  . CKD (chronic kidney disease), stage III ([place])    a. ? CKD - baseline Cr 0.8-1.1.  . Hx of colonic polyps   . Hyperlipidemia   . Hypertension   .  Mild carotid artery disease ([place])    a. Duplex 2014 mild carotid plaque 0-39% BICA.  Marland Kitchen Moderate mitral regurgitation 10/2017  . Moderate tricuspid regurgitation 10/2017  . Uterine cancer [facility])     Past Surgical History:  Procedure Laterality Date  . ABDOMINAL HYSTERECTOMY    . PARTIAL COLECTOMY      Current Medications: Current Meds  Medication Sig  . Cholecalciferol (VITAMIN D3) 5000 units TABS Take by mouth.  . furosemide (LASIX) 20 MG tablet Take 2 tablets by mouth daily, take 1 extra tablet daily as needed only for weight gain of 2lbs or shortness of breath  . losartan (COZAAR) 25 MG tablet TAKE 1 TABLET(25 MG) BY MOUTH DAILY  . metoprolol succinate (TOPROL-XL) 100 MG 24 hr tablet 1 EVERY DAY  . warfarin (JANTOVEN) 5 MG tablet 5-2.5-2.5  . [DISCONTINUED] metoprolol succinate (TOPROL-XL) 50 MG 24 hr tablet Take 1 1/2 tablets (75 mg total) by mouth daily.     Allergies:   Hydrocod polst-cpm polst er   Social History   Socioeconomic History  . Marital status: Married    Spouse name: None  . Number of children: 5  . Years of education: None  . Highest education level: None  Social Needs  . Financial  resource strain: None  . Food insecurity - worry: None  . Food insecurity - inability: None  . Transportation needs - medical: None  . Transportation needs - non-medical: None  Occupational History  . None  Tobacco Use  . Smoking status: Never Smoker  . Smokeless tobacco: Never Used  Substance and Sexual Activity  . Alcohol use: No  . Drug use: No  . Sexual activity: None  Other Topics Concern  . None  Social History Narrative  . None     Family History:  The patient's family history includes Heart attack (age of onset: 43) in her son; Heart attack (age of onset: 12) in her father; Heart disease in her unknown relative.   ROS:   Please see the history of present illness.    Review of Systems  Constitution: Positive for malaise/fatigue.  HENT: Negative.   Eyes:  Negative.   Cardiovascular: Negative.   Respiratory: Negative.   Hematologic/Lymphatic: Negative.   Musculoskeletal: Negative.  Negative for joint pain.  Gastrointestinal: Negative.   Genitourinary: Negative.   Neurological: Negative.    All other systems reviewed and are negative.   PHYSICAL EXAM:   VS:  BP 134/86   Pulse 90   Ht 5' 5"  (1.651 m)   Wt 122 lb (55.3 kg)   SpO2 94%   BMI 20.30 kg/m   Physical Exam  GEN: Thin, in no acute distress  Neck: no JVD, carotid bruits, or masses Cardiac: Irregular irregular at 90 bpm with 2/6 systolic murmur at the apex Respiratory:  clear to auscultation bilaterally, normal work of breathing GI: soft, nontender, nondistended, + BS Ext: Trace of ankle edema bilaterally without cyanosis, clubbing Good distal pulses bilaterally Neuro:  Alert and Oriented x 3 Psych: euthymic mood, full affect  Wt Readings from Last 3 Encounters:  11/27/17 122 lb (55.3 kg)  11/13/17 123 lb 12.8 oz (56.2 kg)  10/30/17 124 lb (56.2 kg)      Studies/Labs Reviewed:   EKG:  EKG is  ordered today.  The ekg ordered today demonstrates atrial fibrillation 88 bpm was faster when she first got here  Recent Labs: 09/06/2017: Hemoglobin 14.9; Platelets 173 11/10/2017: BUN 29; Creatinine, Ser 1.14; NT-Pro BNP 3,208; Potassium 5.0; Sodium 142 11/13/2017: TSH 5.530   Lipid Panel No results found for: CHOL, TRIG, HDL, CHOLHDL, VLDL, LDLCALC, LDLDIRECT  Additional studies/ records that were reviewed today include:  2D echo 1/18/19Study Conclusions   - Left ventricle: The cavity size was normal. Wall thickness was   increased in a pattern of mild LVH. Systolic function was mildly   to moderately reduced. The estimated ejection fraction was in the   range of 40% to 45%. There is dyskinesis of the anteroseptal   myocardium. - Aortic valve: There was mild regurgitation. - Mitral valve: Calcified annulus. There was moderate   regurgitation. - Left atrium: The  atrium was severely dilated. - Right atrium: The atrium was severely dilated. - Tricuspid valve: There was moderate regurgitation.   Impressions:   - Dyskinesis of the septum with overall mild to moderate LV   dysfunction (EF 40); mild LVH; AI; moderate MR; severe biatrial   enlargement; moderate TR.     ASSESSMENT:    1. Chronic atrial fibrillation ([place])   2. Chronic combined systolic and diastolic CHF (congestive heart failure) ([place])   3. Essential hypertension   4. CKD (chronic kidney disease) stage 3, GFR 30-59 ml/min (HCC)   5. Paroxysmal atrial fibrillation ([place])  6. Moderate mitral regurgitation   7. Fatigue, unspecified type      PLAN:  In order of problems listed above:  Chronic atrial fibrillation on Coumadin diltiazem switched to Toprol because of new LV dysfunction on echo.  Her heart is still racing.  I believe this is contributing to her fatigue and CHF.  Will increase Toprol to 100 mg daily in place a 48-hour monitor to check heart rate control.  Follow-up with me or [name] in 1 month.  Chronic combined systolic and diastolic CHF ejection fraction 40-45%.  Last BNP 3208 11/10/17, patient has a trace of edema today because she only took one Lasix yesterday and today.  She will take her extra dose when she gets home for a total of 40 mg today.  Essential hypertension blood pressure well controlled  CKD creatinine 1.14 on losartan.  Moderate mitral regurgitation.  He does have history of rheumatic heart disease so she is not a candidate for mitral clipping.  Fatigue patient has days where she is feeling totally wiped out.  At first she thought was from the Toprol but she has days she feels great like today.  I suspect it is a combination of tachycardia as well as an elevated TSH.  I do not think it is from the Toprol since she is not having it every day.  She sees primary care next week about the elevated TSH. Medication Adjustments/Labs and Tests  Ordered: Current medicines are reviewed at length with the patient today.  Concerns regarding medicines are outlined above.  Medication changes, Labs and Tests ordered today are listed in the Patient Instructions below. Patient Instructions  Your physician has recommended you make the following change in your medication:  INCREASE METOPROLOL TO 100 MG EVERY DAY   Your physician has recommended that you wear a holter monitor. Holter monitors are medical devices that record the heart's electrical activity. Doctors most often use these monitors to diagnose arrhythmias. Arrhythmias are problems with the speed or rhythm of the heartbeat. The monitor is a small, portable device. You can wear one while you do your normal daily activities. This is usually used to diagnose what is causing palpitations/syncope (passing out).  [place] recommends that you schedule a follow-up appointment in:  [place]     Signed, Ermalinda Barrios, PA-C  11/27/2017 10:07 AM    [address], [place], [place]  [address] Phone: [phone]; Fax: [phone]

## 2017-11-27 NOTE — Patient Instructions (Signed)
Your physician has recommended you make the following change in your medication:  INCREASE METOPROLOL TO 100 MG EVERY DAY   Your physician has recommended that you wear a holter monitor. Holter monitors are medical devices that record the heart's electrical activity. Doctors most often use these monitors to diagnose arrhythmias. Arrhythmias are problems with the speed or rhythm of the heartbeat. The monitor is a small, portable device. You can wear one while you do your normal daily activities. This is usually used to diagnose what is causing palpitations/syncope (passing out).  [facility] physician recommends that you schedule a follow-up appointment in:  [place] Ermalinda Barrios PA

## 2017-12-05 ENCOUNTER — Other Ambulatory Visit: Payer: Self-pay | Admitting: Physician Assistant

## 2017-12-05 DIAGNOSIS — I482 Chronic atrial fibrillation, unspecified: Secondary | ICD-10-CM

## 2017-12-05 DIAGNOSIS — R000 Tachycardia, unspecified: Secondary | ICD-10-CM

## 2017-12-06 ENCOUNTER — Ambulatory Visit (INDEPENDENT_AMBULATORY_CARE_PROVIDER_SITE_OTHER): Payer: Medicare Other

## 2017-12-06 DIAGNOSIS — I482 Chronic atrial fibrillation, unspecified: Secondary | ICD-10-CM

## 2017-12-06 DIAGNOSIS — R000 Tachycardia, unspecified: Secondary | ICD-10-CM | POA: Diagnosis not present

## 2017-12-10 ENCOUNTER — Other Ambulatory Visit: Payer: Self-pay | Admitting: Physician Assistant

## 2017-12-11 NOTE — Telephone Encounter (Signed)
Medication Detail    Disp Refills Start End   metoprolol succinate (TOPROL-XL) 100 MG 24 hr tablet 90 tablet 3 11/27/2017    Sig: 1 EVERY DAY   Sent to pharmacy as: metoprolol succinate (TOPROL-XL) 100 MG 24 hr tablet   Notes to Pharmacy: **Patient requests 90 days supply**   E-Prescribing Status: Receipt confirmed by pharmacy (11/27/2017 10:04 AM EST)   Pharmacy   [facility] [id number] - [place] [place]

## 2017-12-13 ENCOUNTER — Telehealth: Payer: Self-pay

## 2017-12-13 DIAGNOSIS — R7989 Other specified abnormal findings of blood chemistry: Secondary | ICD-10-CM

## 2017-12-13 MED ORDER — DILTIAZEM HCL ER COATED BEADS 120 MG PO CP24: 120.0000 mg | ORAL_CAPSULE | Freq: Every day | ORAL | 3 refills | Status: DC

## 2017-12-13 NOTE — Telephone Encounter (Signed)
-----   Message from [name], MD sent at 12/13/2017 11:59 AM EST ----- She has Atrial fibrillation frequently with RVR, Infrequent PVCs (260 in 48 hours), Frequent couplets. I would add cardizem CD 120 mg po daily to be taken at night to her regimen.

## 2017-12-13 NOTE — Telephone Encounter (Signed)
I would continue medications as instructed and repeat crea at the next visit

## 2017-12-13 NOTE — Telephone Encounter (Signed)
Called patient with monitor results. Per [name],  She has Atrial fibrillation frequently with RVR, Infrequent PVCs (260 in 48 hours), Frequent couplets. I would add Cardizem CD 120 mg po daily to be taken at night to her regimen.  Patient stated she has been having trouble sleeping and getting SOB. Informed patient that adding Cardizem back to her medications will help her rhythm and might help her sleep better. Patient stated she also has some issues with her kidneys, and is concerned about the medications she is taking. Patient is taking Lasix 40 mg by mouth daily. Patient stated she took her Lasix this morning and her SOB improved and she had a lot of output. Informed patient to give our office a call if her symptoms do not improve with the medication changes. Will send to [name] and her nurse for further advisement.

## 2017-12-13 NOTE — Telephone Encounter (Signed)
Called patient and made her aware of Dr. Francesca Oman recommendations. Patient verbalized understanding. Will schedule BMET after next office visit.

## 2017-12-14 NOTE — Telephone Encounter (Signed)
Taken care of by P. Ingalls

## 2017-12-20 ENCOUNTER — Encounter: Payer: Self-pay | Admitting: Physician Assistant

## 2017-12-25 ENCOUNTER — Encounter: Payer: Self-pay | Admitting: Physician Assistant

## 2017-12-25 NOTE — Progress Notes (Signed)
Cardiology Office Note    Date:  12/27/2017  ID:  Sara Griffin, DOB 15-Sep-1930, MRN [id number] PCP:  Algis Greenhouse, MD  Cardiologist:  [name]   Chief Complaint: f/u CHF, afib  History of Present Illness:  Sara Griffin is a 84 y.o. female with history of chronic atrial fib on coumadin for CHADSVASC=4, HTN, HLD, carotid artery stenosis, and recent acute systolic CHF, moderate MR/TR, possible CKD stage III who presents for f/u CHF.  She has been followed for CHF symptoms recently. She was seen by [name] in 08/2017 with SOB and edema. She was started on Lasix 45m (to use 414mdaily when weight goes up by 3lb overnight). When seen in followup by [name]/7/19 she complained of worsened orthopnea, edema, and palpitations in the setting of Lasix 4028maily. HR was in the 70s but diltiazem was increased to 240m3mily in case this represented increased rates. BNP remained high at 4473, prompting increase in Lasix to 60mg104mly x 3 days then back to 40mg 105my. Otherwise BUN 29/Cr 1.22, K 5.0 (previously 0.8-1.1 in 2018). Last CBC in 08/2017 was normal. Repeat labs 1/18 showed BNP 3208, BUN 29, Cr 1.14, K 5.0. Echo 11/10/17 showed mild LVH, EF 40-45%, dyskinesis of anteroseptal myocardium, moderate mitral regurgitation, severely dilated LA/RA, moderate TR. Other studies reviewed include carotid duplex 2014 with 0-39% stenosis and nuclear stress test 02/2016 which was negative for ischemia. When I saw her 1/21 she was feeling better. Given LV dysfunction we changed diltiazem to Toprol after discussing with Dr. NelsonMeda Coffeeinitially felt worse with the change but this was suspected due to decreased rate control so the dose of metoprolol was further titrated. Holter monitor in February 2019 showed atrial fib with infrequent PVCs, frequent couplets, couple of NSVT with longest lasting 3 beats. Avg HR 102. Dr. NelsonMeda Coffee Cardizem 120mg b38mto her regimen per result note. Recent TSH was  5.530 and was advised to f/u PCP. Since last visit, her PCP switched her from Warfarin to Xarelto 15mg da19m  She returns for follow-up with her son today overall feeling well. Has good days and bad days but generally feeling better. Volume status is stable on Lasix 40mg dai3m she now has a 40mg tab 35mome per fill 10/2017. We spent a lot of time clarifying her medication list. She tends to save old med recs and also consults people at church as well as the internet for information on her medication, further complicating her comprehension of what she's taking. Son states he will be stepping in to help with meds.   Past Medical History:  Diagnosis Date  . Cardiomyopathy (HCC)    [place]: mild LVH, EF 40-45, ant-sept DK, mild AI, MAC, mod MR, severe BAE, mod TR  . Chronic atrial fibrillation (HCC)   . [place] systolic CHF (congestive heart failure) (HCC)    a.[place] 10/2017 but sx dating back to 08/2017.  . CKD (chronic kidney disease), stage III (HCC)    a[place] - baseline Cr 0.8-1.1.  . Hx of colonic polyps   . Hyperlipidemia   . Hypertension   . Mild carotid artery disease (HCC)    a.[place] 2014 mild carotid plaque 0-39% BICA.  . ModerateMarland Kitchenmitral regurgitation 10/2017  . Moderate tricuspid regurgitation 10/2017  . PVC's (premature ventricular contractions)   . Uterine cancer (HCC)     [facility] Surgical History:  Procedure Laterality Date  . ABDOMINAL HYSTERECTOMY    .  PARTIAL COLECTOMY      Current Medications: Current Meds  Medication Sig  . diltiazem (CARDIZEM CD) 120 MG 24 hr capsule Take 1 capsule (120 mg total) by mouth daily.  . furosemide (LASIX) 20 MG tablet Take 2 tablets by mouth daily, take 1 extra tablet daily as needed only for weight gain of 2lbs or shortness of breath  . losartan (COZAAR) 25 MG tablet TAKE 1 TABLET(25 MG) BY MOUTH DAILY  . metoprolol succinate (TOPROL-XL) 100 MG 24 hr tablet 1 EVERY DAY  . XARELTO 15 MG TABS tablet Take 1 tablet by mouth daily.      Allergies:   Hydrocod polst-cpm polst er   Social History   Socioeconomic History  . Marital status: Married    Spouse name: None  . Number of children: 5  . Years of education: None  . Highest education level: None  Social Needs  . Financial resource strain: None  . Food insecurity - worry: None  . Food insecurity - inability: None  . Transportation needs - medical: None  . Transportation needs - non-medical: None  Occupational History  . None  Tobacco Use  . Smoking status: Never Smoker  . Smokeless tobacco: Never Used  Substance and Sexual Activity  . Alcohol use: No  . Drug use: No  . Sexual activity: None  Other Topics Concern  . None  Social History Narrative  . None     Family History:  Family History  Problem Relation Age of Onset  . Heart attack Father 58       died  . Heart disease Unknown        famil,y hx of  . Heart attack Son 22       died    ROS:   Please see the history of present illness. All other systems are reviewed and otherwise negative.    PHYSICAL EXAM:   VS:  BP 126/82   Pulse 88   Ht _0  (1.651 m)   Wt 124 lb 1.9 oz (56.3 kg)   BMI 20.65 kg/m   BMI: Body mass index is 20.65 kg/m. GEN: Well nourished, well developed WF, in no acute distress  HEENT: normocephalic, atraumatic Neck: no JVD, carotid bruits, or masses Cardiac: irregularly irregular, rate controlled, no murmurs, rubs, or gallops, no edema  Respiratory:  clear to auscultation bilaterally, normal work of breathing GI: soft, nontender, nondistended, + BS MS: no deformity or atrophy  Skin: warm and dry, no rash Neuro:  Alert and Oriented x 3, Strength and sensation are intact, follows commands Psych: euthymic mood, full affect  Wt Readings from Last 3 Encounters:  12/27/17 124 lb 1.9 oz (56.3 kg)  11/27/17 122 lb (55.3 kg)  11/13/17 123 lb 12.8 oz (56.2 kg)      Studies/Labs Reviewed:   EKG:  EKG was ordered today and personally reviewed by me and  demonstrates atrial fib 88bpm, prior septal infarct, nonspecific ST-T changes. Similar to prior.  Recent Labs: 09/06/2017: Hemoglobin 14.9; Platelets 173 11/10/2017: BUN 29; Creatinine, Ser 1.14; NT-Pro BNP 3,208; Potassium 5.0; Sodium 142 11/13/2017: TSH 5.530   Lipid Panel No results found for: CHOL, TRIG, HDL, CHOLHDL, VLDL, LDLCALC, LDLDIRECT  Additional studies/ records that were reviewed today include: Summarized above.    ASSESSMENT & PLAN:   1. Chronic systolic CHF - appears euvolemic. She now affirms she has a Lasix 82m tablet at home. We will continue Lasix 469mdaily and as before, may take an extra  56m daily as needed for any weight gain or worsening symptoms of CHF. She previously elected to defer any invasive evaluation of LV dysfunction. Continue medical therapy with ARB and BB as well. 2. Chronic atrial fib - rate improved to the 80s on current regimen and she is feeling better. Although diltiazem is less ideal with LV dysfunction, Dr. NMeda Coffeerecently re-added to her regimen given suboptimal rate control on monitor. She did not seem to tolerate titration of the metoprolol any further so this is a reasonable approach. It still remains questionable whether she actually had fatigue due to the metoprolol itself, or the issue of uncontrolled heart rates. Regardless, she seems to have found a happy medium on BB/CCB so we will continue. We also discussed bleeding risk of Xarelto vs Coumadin and signs/symptoms of pathologic bleeding. She will monitor. 3. CKD III - based on 2/20 phone note, Dr. NMeda Coffeerecommended f/u BMET today, will send to lab after visit. 4. Moderate MR/TR - as previously discussed, will be followed clinically for now given advanced age. Will focus on optimal volume status and rate control. 5. Abnormal TSH - patient requests repeat testing today as she is not sure what her PCP previously did for the abnormal value in December. Will check with free T4 as  well.  Disposition: F/u with Dr. NMeda Coffeeas scheduled 01/2018. I offered for her to push the visit back if feeling well. She will keep this for now. I told her to bring all her bottles to this appointment to decrease med confusion. I also asked her to please put aside any prior med recs to avoid future confusion. Her son will assist with this as well.   Medication Adjustments/Labs and Tests Ordered: Current medicines are reviewed at length with the patient today.  Concerns regarding medicines are outlined above. Medication changes, Labs and Tests ordered today are summarized above and listed in the Patient Instructions accessible in Encounters.   Signed, DCharlie Pitter PA-C  12/27/2017 9:12 AM    [facility] [address]  [id number]: [phone] Fax: [phone]

## 2017-12-27 ENCOUNTER — Encounter: Payer: Self-pay | Admitting: Physician Assistant

## 2017-12-27 ENCOUNTER — Other Ambulatory Visit: Payer: Medicare Other | Admitting: *Deleted

## 2017-12-27 ENCOUNTER — Ambulatory Visit (INDEPENDENT_AMBULATORY_CARE_PROVIDER_SITE_OTHER): Payer: Medicare Other | Admitting: Physician Assistant

## 2017-12-27 VITALS — BP 126/82 | HR 88 | Ht 65.0 in | Wt 124.1 lb

## 2017-12-27 DIAGNOSIS — N183 Chronic kidney disease, stage 3 unspecified: Secondary | ICD-10-CM

## 2017-12-27 DIAGNOSIS — I340 Nonrheumatic mitral (valve) insufficiency: Secondary | ICD-10-CM | POA: Diagnosis not present

## 2017-12-27 DIAGNOSIS — I482 Chronic atrial fibrillation, unspecified: Secondary | ICD-10-CM

## 2017-12-27 DIAGNOSIS — R7989 Other specified abnormal findings of blood chemistry: Secondary | ICD-10-CM

## 2017-12-27 DIAGNOSIS — I071 Rheumatic tricuspid insufficiency: Secondary | ICD-10-CM | POA: Diagnosis not present

## 2017-12-27 DIAGNOSIS — I5022 Chronic systolic (congestive) heart failure: Secondary | ICD-10-CM

## 2017-12-27 LAB — BASIC METABOLIC PANEL
BUN/Creatinine Ratio: 24 (ref 12–28)
BUN: 29 mg/dL — ABNORMAL HIGH (ref 8–27)
CO2: 25 mmol/L (ref 20–29)
Calcium: 9.1 mg/dL (ref 8.7–10.3)
Chloride: 103 mmol/L (ref 96–106)
Creatinine, Ser: 1.2 mg/dL — ABNORMAL HIGH (ref 0.57–1.00)
GFR calc Af Amer: 48 mL/min/{1.73_m2} — ABNORMAL LOW (ref >59)
GFR calc non Af Amer: 41 mL/min/{1.73_m2} — ABNORMAL LOW (ref >59)
Glucose: 87 mg/dL (ref 65–99)
Potassium: 4.3 mmol/L (ref 3.5–5.2)
Sodium: 143 mmol/L (ref 134–144)

## 2017-12-27 LAB — TSH: TSH: 7.12 u[IU]/mL — AB (ref 0.450–4.500)

## 2017-12-27 LAB — T4, FREE: FREE T4: 1.06 ng/dL (ref 0.82–1.77)

## 2017-12-27 MED ORDER — FUROSEMIDE 40 MG PO TABS: ORAL_TABLET | ORAL | 3 refills | Status: DC

## 2017-12-27 NOTE — Patient Instructions (Addendum)
Medication Instructions:  Your physician recommends that you continue on your current medications as directed. Please refer to the Current Medication list given to you today.  You should be taking Lasix 40 mg daily with the ok to take an extra 1/2 tablet only as needed for swelling and weight gain  Labwork: TODAY:  BMET, TSH, & FREE T4  Testing/Procedures: None ordered  Follow-Up: Your physician recommends that you schedule a follow-up appointment in: 01/31/18 ARRIVE AT 8:45 FOR YOUR APPOINTMENT.  MAKE SURE YOU BRING ALL OF YOUR MEDICATION BOTTLES WITH YOU TO THIS APPOINTMENT.   Any Other Special Instructions Will Be Listed Below (If Applicable).     If you need a refill on your cardiac medications before your next appointment, please call your pharmacy.

## 2018-01-31 ENCOUNTER — Ambulatory Visit (INDEPENDENT_AMBULATORY_CARE_PROVIDER_SITE_OTHER): Payer: Medicare Other | Admitting: Cardiology

## 2018-01-31 ENCOUNTER — Encounter: Payer: Self-pay | Admitting: Cardiology

## 2018-01-31 VITALS — BP 128/78 | HR 84 | Ht 65.0 in | Wt 123.0 lb

## 2018-01-31 DIAGNOSIS — I071 Rheumatic tricuspid insufficiency: Secondary | ICD-10-CM

## 2018-01-31 DIAGNOSIS — I481 Persistent atrial fibrillation: Secondary | ICD-10-CM | POA: Diagnosis not present

## 2018-01-31 DIAGNOSIS — I10 Essential (primary) hypertension: Secondary | ICD-10-CM | POA: Diagnosis not present

## 2018-01-31 DIAGNOSIS — I5022 Chronic systolic (congestive) heart failure: Secondary | ICD-10-CM | POA: Diagnosis not present

## 2018-01-31 DIAGNOSIS — I4819 Other persistent atrial fibrillation: Secondary | ICD-10-CM

## 2018-01-31 DIAGNOSIS — N183 Chronic kidney disease, stage 3 unspecified: Secondary | ICD-10-CM

## 2018-01-31 LAB — BASIC METABOLIC PANEL
BUN/Creatinine Ratio: 27 (ref 12–28)
BUN: 31 mg/dL — ABNORMAL HIGH (ref 8–27)
CO2: 22 mmol/L (ref 20–29)
Calcium: 9.4 mg/dL (ref 8.7–10.3)
Chloride: 103 mmol/L (ref 96–106)
Creatinine, Ser: 1.14 mg/dL — ABNORMAL HIGH (ref 0.57–1.00)
GFR calc Af Amer: 51 mL/min/{1.73_m2} — ABNORMAL LOW (ref >59)
GFR calc non Af Amer: 44 mL/min/{1.73_m2} — ABNORMAL LOW (ref >59)
Glucose: 82 mg/dL (ref 65–99)
Potassium: 4.6 mmol/L (ref 3.5–5.2)
Sodium: 141 mmol/L (ref 134–144)

## 2018-01-31 LAB — PRO B NATRIURETIC PEPTIDE: NT-Pro BNP: 3537 pg/mL — ABNORMAL HIGH (ref 0–738)

## 2018-01-31 NOTE — Patient Instructions (Signed)
Medication Instructions:   Your physician recommends that you continue on your current medications as directed. Please refer to the Current Medication list given to you today.   Labwork:  TODAY--BMET AND PRO-BNP    Follow-Up:  3 MONTHS WITH DR [name] OR WITH DAYNA DUNN PA-C       If you need a refill on your cardiac medications before your next appointment, please call your pharmacy.

## 2018-01-31 NOTE — Progress Notes (Signed)
Cardiology Office Note    Date:  01/31/2018  ID:  LANDRY LOOKINGBILL, DOB 1930/01/20, MRN [id number] PCP:  Algis Greenhouse, MD  Cardiologist:  [name]   Chief Complaint: f/u CHF, afib  History of Present Illness:  Sara Griffin is a 84 y.o. female with history of chronic atrial fib on coumadin for CHADSVASC=4, HTN, HLD, carotid artery stenosis, and recent acute systolic CHF, moderate MR/TR, possible CKD stage III who presents for f/u CHF.  She has been followed for CHF symptoms recently. She was seen by [name] in 08/2017 with SOB and edema. She was started on Lasix 36m (to use 461mdaily when weight goes up by 3lb overnight). When seen in followup by [name]/7/19 she complained of worsened orthopnea, edema, and palpitations in the setting of Lasix 4082maily. HR was in the 70s but diltiazem was increased to 240m57mily in case this represented increased rates. BNP remained high at 4473, prompting increase in Lasix to 60mg56mly x 3 days then back to 40mg 36my. Otherwise BUN 29/Cr 1.22, K 5.0 (previously 0.8-1.1 in 2018). Last CBC in 08/2017 was normal. Repeat labs 1/18 showed BNP 3208, BUN 29, Cr 1.14, K 5.0. Echo 11/10/17 showed mild LVH, EF 40-45%, dyskinesis of anteroseptal myocardium, moderate mitral regurgitation, severely dilated LA/RA, moderate TR. Other studies reviewed include carotid duplex 2014 with 0-39% stenosis and nuclear stress test 02/2016 which was negative for ischemia. When I saw her 1/21 she was feeling better. Given LV dysfunction we changed diltiazem to Toprol after discussing with Dr. NelsonMeda Coffeeinitially felt worse with the change but this was suspected due to decreased rate control so the dose of metoprolol was further titrated. Holter monitor in February 2019 showed atrial fib with infrequent PVCs, frequent couplets, couple of NSVT with longest lasting 3 beats. Avg HR 102. Dr. NelsonMeda Coffee Cardizem 120mg b26mto her regimen per result note. Recent TSH was  5.530 and was advised to f/u PCP. Since last visit, her PCP switched her from Warfarin to Xarelto 15mg da35m  She returns for follow-up with her son today overall feeling well. Has good days and bad days but generally feeling better. Volume status is stable on Lasix 40mg dai19m she now has a 40mg tab 45mome per fill 10/2017. We spent a lot of time clarifying her medication list. She tends to save old med recs and also consults people at church as well as the internet for information on her medication, further complicating her comprehension of what she's taking. Son states he will be stepping in to help with meds.  01/31/2018 - this is one month follow-up, the patient states that she feels significantly better, she now doesn't feel palpitations, denies any dizziness falls or syncope. She has no orthopnea or paroxysmal nocturnal dyspnea.she has intermittent lower extremity edema that she controls with daily Lasix and extra half a pill of Lasix as needed.denies any bleeding.   Past Medical History:  Diagnosis Date  . Cardiomyopathy (HCC)    [place]: mild LVH, EF 40-45, ant-sept DK, mild AI, MAC, mod MR, severe BAE, mod TR  . Chronic atrial fibrillation (HCC)   . [place] systolic CHF (congestive heart failure) (HCC)    a.[place] 10/2017 but sx dating back to 08/2017.  . CKD (chronic kidney disease), stage III (HCC)    a.[place] - baseline Cr 0.8-1.1.  . Hx of colonic polyps   . Hyperlipidemia   . Hypertension   .  Mild carotid artery disease ([place])    a. Duplex 2014 mild carotid plaque 0-39% BICA.  Marland Kitchen Moderate mitral regurgitation 10/2017  . Moderate tricuspid regurgitation 10/2017  . PVC's (premature ventricular contractions)   . Uterine cancer [facility])     Past Surgical History:  Procedure Laterality Date  . ABDOMINAL HYSTERECTOMY    . PARTIAL COLECTOMY      Current Medications: Current Meds  Medication Sig  . diltiazem (CARDIZEM CD) 120 MG 24 hr capsule Take 1 capsule (120 mg total) by  mouth daily.  . furosemide (LASIX) 40 MG tablet Take 1 tablet by mouth daily. May take extra 1/2 tab daily as needed for swelling or weight gain  . losartan (COZAAR) 25 MG tablet TAKE 1 TABLET(25 MG) BY MOUTH DAILY  . metoprolol succinate (TOPROL-XL) 100 MG 24 hr tablet Take 100 mg by mouth daily. Take with or immediately following a meal. Patient sometimes takes 50 mg. Depending on how she feels  . XARELTO 15 MG TABS tablet Take 1 tablet by mouth daily with supper.      Allergies:   Hydrocod polst-cpm polst er   Social History   Socioeconomic History  . Marital status: Married    Spouse name: Not on file  . Number of children: 5  . Years of education: Not on file  . Highest education level: Not on file  Occupational History  . Not on file  Social Needs  . Financial resource strain: Not on file  . Food insecurity:    Worry: Not on file    Inability: Not on file  . Transportation needs:    Medical: Not on file    Non-medical: Not on file  Tobacco Use  . Smoking status: Never Smoker  . Smokeless tobacco: Never Used  Substance and Sexual Activity  . Alcohol use: No  . Drug use: No  . Sexual activity: Not on file  Lifestyle  . Physical activity:    Days per week: Not on file    Minutes per session: Not on file  . Stress: Not on file  Relationships  . Social connections:    Talks on phone: Not on file    Gets together: Not on file    Attends religious service: Not on file    Active member of club or organization: Not on file    Attends meetings of clubs or organizations: Not on file    Relationship status: Not on file  Other Topics Concern  . Not on file  Social History Narrative  . Not on file     Family History:  Family History  Problem Relation Age of Onset  . Heart attack Father 109       died  . Heart disease Unknown        famil,y hx of  . Heart attack Son 68       died    ROS:   Please see the history of present illness. All other systems are  reviewed and otherwise negative.    PHYSICAL EXAM:   VS:  BP 128/78   Pulse 84   Ht _0  (1.651 m)   Wt 123 lb (55.8 kg)   SpO2 98%   BMI 20.47 kg/m   BMI: Body mass index is 20.47 kg/m. GEN: Well nourished, well developed WF, in no acute distress  HEENT: normocephalic, atraumatic Neck: no JVD, carotid bruits, or masses Cardiac: irregularly irregular, rate controlled, no murmurs, rubs, or gallops, no edema  Respiratory:  clear to auscultation bilaterally, normal work of breathing GI: soft, nontender, nondistended, + BS MS: no deformity or atrophy  Skin: warm and dry, no rash Neuro:  Alert and Oriented x 3, Strength and sensation are intact, follows commands Psych: euthymic mood, full affect  Wt Readings from Last 3 Encounters:  01/31/18 123 lb (55.8 kg)  12/27/17 124 lb 1.9 oz (56.3 kg)  11/27/17 122 lb (55.3 kg)      Studies/Labs Reviewed:   EKG:  EKG was ordered today and personally reviewed by me and demonstrates atrial fib 88bpm, prior septal infarct, nonspecific ST-T changes. Similar to prior.  Recent Labs: 09/06/2017: Hemoglobin 14.9; Platelets 173 11/10/2017: NT-Pro BNP 3,208 12/27/2017: BUN 29; Creatinine, Ser 1.20; Potassium 4.3; Sodium 143; TSH 7.120   Lipid Panel No results found for: CHOL, TRIG, HDL, CHOLHDL, VLDL, LDLCALC, LDLDIRECT  Additional studies/ records that were reviewed today include: Summarized above.    ASSESSMENT & PLAN:   1. Chronic systolic CHF - appears euvolemic. She now affirms she has a Lasix 94m tablet at home. We will continue Lasix 439mdaily and as before, may take an extra 2049maily as needed for any weight gain or worsening symptoms of CHF. She is managing her daily weights and Lasix very well. She previously elected to defer any invasive evaluation of LV dysfunction. Continue medical therapy with ARB and BB as well. 2. Chronic atrial fib - rate improved to the 80s on current regimen, with significant improvement of  symptoms. 3. CKD III -[place] on 12/27/17 1.2, we will repeat today with BNP. 4. Moderate MR/TR - as previously discussed, will be followed clinically for now given advanced age. Will focus on optimal volume status and rate control. 5. Abnormal TSH -normal fT4, we will recheck in 3 months.  Disposition: F/u in 3 months.  Medication Adjustments/Labs and Tests Ordered: Current medicines are reviewed at length with the patient today.  Concerns regarding medicines are outlined above. Medication changes, Labs and Tests ordered today are summarized above and listed in the Patient Instructions accessible in Encounters.   Signed, KatEna DawleyD  01/31/2018 9:18 AM    [facility] [address]  [id number]: [phone]: [phone]

## 2018-03-25 IMAGING — CR DG CHEST 1V PORT
1 series · 1 of 1 positions shown · non-contrast
Comparison: None

CLINICAL DATA: Shortness of breath.

EXAM:
PORTABLE CHEST 1 VIEW

[AP]
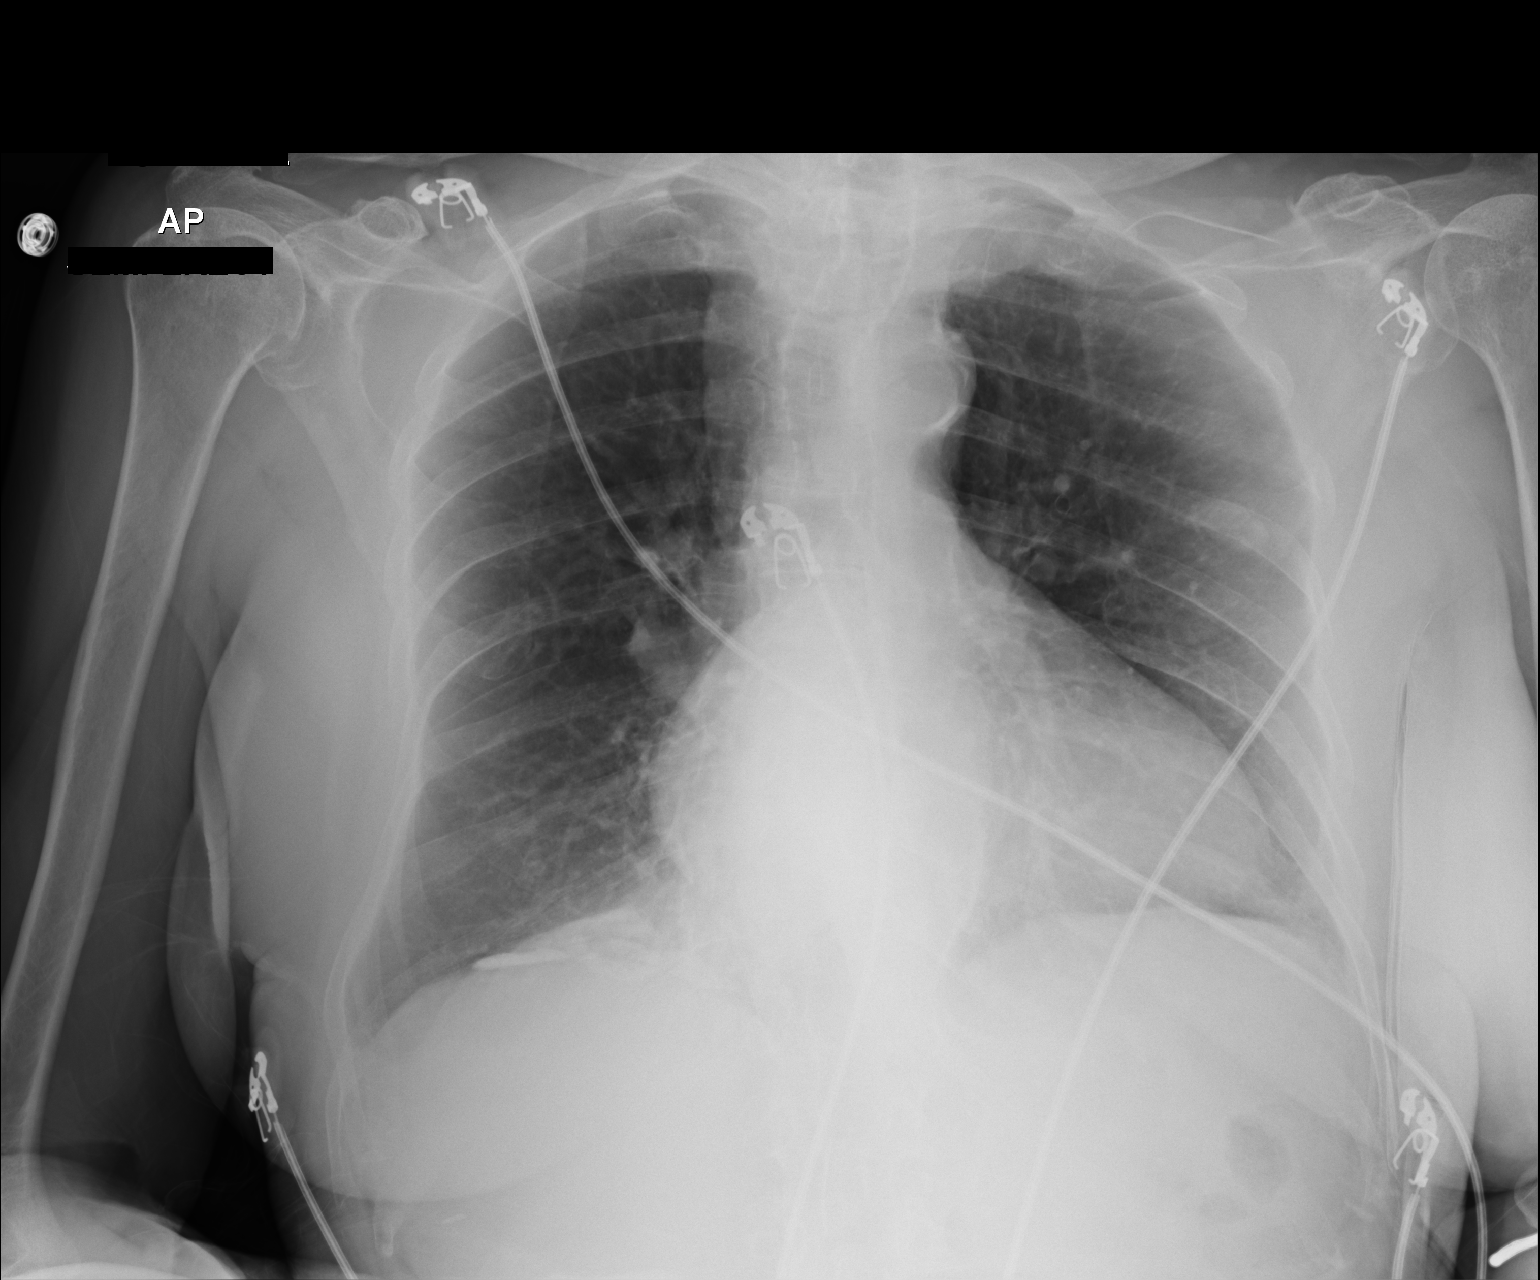

[1 of 1 positions shown; findings below may reference images not displayed]

FINDINGS: There is cardiomegaly with tortuosity and calcification of the
thoracic aorta. Chronic pleural calcifications at the right lung
base. Vague ovoid density in the left midzone could represent a
pleural plaque or artifact are nodule in the lung. This was not
visible on the scout image of the CT scan dated 03/13/2012.

The lungs are otherwise clear.
IMPRESSION: Possible nodule in the left mid lung zone.

Chronic cardiomegaly.

Aortic atherosclerosis.

## 2018-08-19 ENCOUNTER — Other Ambulatory Visit: Payer: Self-pay | Admitting: Cardiology

## 2018-08-23 ENCOUNTER — Ambulatory Visit (INDEPENDENT_AMBULATORY_CARE_PROVIDER_SITE_OTHER): Payer: Medicare Other | Admitting: Cardiology

## 2018-08-23 VITALS — BP 136/84 | HR 82 | Ht 65.0 in | Wt 128.0 lb

## 2018-08-23 DIAGNOSIS — I482 Chronic atrial fibrillation, unspecified: Secondary | ICD-10-CM

## 2018-08-23 DIAGNOSIS — I10 Essential (primary) hypertension: Secondary | ICD-10-CM | POA: Diagnosis not present

## 2018-08-23 DIAGNOSIS — N183 Chronic kidney disease, stage 3 unspecified: Secondary | ICD-10-CM

## 2018-08-23 DIAGNOSIS — I5042 Chronic combined systolic (congestive) and diastolic (congestive) heart failure: Secondary | ICD-10-CM

## 2018-08-23 MED ORDER — LOSARTAN POTASSIUM 25 MG PO TABS: ORAL_TABLET | ORAL | 3 refills | Status: DC

## 2018-08-23 MED ORDER — DILTIAZEM HCL ER COATED BEADS 240 MG PO CP24: 240.0000 mg | ORAL_CAPSULE | Freq: Every day | ORAL | 3 refills | Status: DC

## 2018-08-23 NOTE — Patient Instructions (Signed)
Medication Instructions:  1) Make sure you are taking Losartan 25mg  once daily 2) INCREASE Diltiazem to 240mg  once daily 3) DISCONTINUE  Metoprolol  If you need a refill on your cardiac medications before your next appointment, please call your pharmacy.   Lab work: BMET, CBC, TSH, Free T4, Liver and Pro BNP today  If you have labs (blood work) drawn today and your tests are completely normal, you will receive your results only by: Marland Kitchen MyChart Message (if you have MyChart) OR . A paper copy in the mail If you have any lab test that is abnormal or we need to change your treatment, we will call you to review the results.  Testing/Procedures: None  Follow-Up: Your physician recommends that you schedule a follow-up appointment in: 2-4 weeks with the Hypertension clinic.   At [facility], you and your health needs are our priority.  As part of our continuing mission to provide you with exceptional heart care, we have created designated Provider Care Teams.  These Care Teams include your primary Cardiologist (physician) and Advanced Practice Providers (APPs -  Physician Assistants and Nurse Practitioners) who all work together to provide you with the care you need, when you need it. You will need a follow up appointment in 4 months.  Please call our office 2 months in advance to schedule this appointment.  You may see Ena Dawley, MD or one of the following Advanced Practice Providers on your designated Care Team:   [place], PA-C Melina Copa, PA-C . Ermalinda Barrios, PA-C  Any Other Special Instructions Will Be Listed Below (If Applicable).

## 2018-08-23 NOTE — Progress Notes (Signed)
Cardiology Office Note    Date:  08/23/2018  ID:  Sara Griffin, DOB [birth year], MRN [id number] PCP:  Algis Greenhouse, MD  Cardiologist:  [name]   Chief Complaint: f/u CHF, afib  History of Present Illness:  Sara Griffin is a 84 y.o. female with history of chronic atrial fib on coumadin for CHADSVASC=4, HTN, HLD, carotid artery stenosis, and recent acute systolic CHF, moderate MR/TR, possible CKD stage III who presents for f/u CHF.  She has been followed for CHF symptoms recently. She was seen by [name] in 08/2017 with SOB and edema. She was started on Lasix 72m (to use 452mdaily when weight goes up by 3lb overnight). When seen in followup by [name]/7/19 she complained of worsened orthopnea, edema, and palpitations in the setting of Lasix 4017maily. HR was in the 70s but diltiazem was increased to 240m53mily in case this represented increased rates. BNP remained high at 4473, prompting increase in Lasix to 60mg10mly x 3 days then back to 40mg 60my. Otherwise BUN 29/Cr 1.22, K 5.0 (previously 0.8-1.1 in 2018). Last CBC in 08/2017 was normal. Repeat labs 1/18 showed BNP 3208, BUN 29, Cr 1.14, K 5.0. Echo 11/10/17 showed mild LVH, EF 40-45%, dyskinesis of anteroseptal myocardium, moderate mitral regurgitation, severely dilated LA/RA, moderate TR. Other studies reviewed include carotid duplex 2014 with 0-39% stenosis and nuclear stress test 02/2016 which was negative for ischemia. When I saw her 1/21 she was feeling better. Given LV dysfunction we changed diltiazem to Toprol after discussing with Dr. NelsonMeda Coffeeinitially felt worse with the change but this was suspected due to decreased rate control so the dose of metoprolol was further titrated. Holter monitor in February 2019 showed atrial fib with infrequent PVCs, frequent couplets, couple of NSVT with longest lasting 3 beats. Avg HR 102. Dr. NelsonMeda Coffee Cardizem 120mg b18mto her regimen per result note. Recent TSH was  5.530 and was advised to f/u PCP. Since last visit, her PCP switched her from Warfarin to Xarelto 15mg da54m  She returns for follow-up with her son today overall feeling well. Has good days and bad days but generally feeling better. Volume status is stable on Lasix 40mg dai48m she now has a 40mg tab 7mome per fill 10/2017. We spent a lot of time clarifying her medication list. She tends to save old med recs and also consults people at church as well as the internet for information on her medication, further complicating her comprehension of what she's taking. Son states he will be stepping in to help with meds.  01/31/2018 - this is one month follow-up, the patient states that she feels significantly better, she now doesn't feel palpitations, denies any dizziness falls or syncope. She has no orthopnea or paroxysmal nocturnal dyspnea.she has intermittent lower extremity edema that she controls with daily Lasix and extra half a pill of Lasix as needed.denies any bleeding.  08/23/2018, she is coming after 6 months and states that she has been feeling profoundly tired after taking her medications, she has cut down her Toprol-XL to 50 or none at all with improvement of her symptoms and she increased her Cardizem to 240 mg daily, she also takes her Xarelto on and off.  She is experiencing some bruising but no bleeding.  Past Medical History:  Diagnosis Date  . Cardiomy[place] ([place]CC)    [place]: mild LVH, EF 40-45, ant-sept DK, mild AI, MAC, mod MR, severe BAE, mod  TR  . Chronic atrial fibrillation ([place])   . Chronic systolic CHF (congestive heart failure) ([place])    a. dx 10/2017 but sx dating back to 08/2017.  . CKD (chronic kidney disease), stage III ([place])    a. ? CKD - baseline Cr 0.8-1.1.  . Hx of colonic polyps   . Hyperlipidemia   . Hypertension   . Mild carotid artery disease ([place])    a. Duplex 2014 mild carotid plaque 0-39% BICA.  Marland Kitchen Moderate mitral regurgitation 10/2017  . Moderate tricuspid  regurgitation 10/2017  . PVC's (premature ventricular contractions)   . Uterine cancer [facility])     Past Surgical History:  Procedure Laterality Date  . ABDOMINAL HYSTERECTOMY    . PARTIAL COLECTOMY      Current Medications: Current Meds  Medication Sig  . diltiazem (CARDIZEM CD) 120 MG 24 hr capsule Take 1 capsule (120 mg total) by mouth daily.  . furosemide (LASIX) 40 MG tablet Take 1 tablet by mouth daily. May take extra 1/2 tab daily as needed for swelling or weight gain  . losartan (COZAAR) 25 MG tablet TAKE 1 TABLET(25 MG) BY MOUTH DAILY  . metoprolol succinate (TOPROL-XL) 100 MG 24 hr tablet Take 100 mg by mouth daily. Take with or immediately following a meal. Patient sometimes takes 50 mg. Depending on how she feels  . XARELTO 15 MG TABS tablet Take 1 tablet by mouth daily with supper.      Allergies:   Hydrocod polst-cpm polst er   Social History   Socioeconomic History  . Marital status: Married    Spouse name: Not on file  . Number of children: 5  . Years of education: Not on file  . Highest education level: Not on file  Occupational History  . Not on file  Social Needs  . Financial resource strain: Not on file  . Food insecurity:    Worry: Not on file    Inability: Not on file  . Transportation needs:    Medical: Not on file    Non-medical: Not on file  Tobacco Use  . Smoking status: Never Smoker  . Smokeless tobacco: Never Used  Substance and Sexual Activity  . Alcohol use: No  . Drug use: No  . Sexual activity: Not on file  Lifestyle  . Physical activity:    Days per week: Not on file    Minutes per session: Not on file  . Stress: Not on file  Relationships  . Social connections:    Talks on phone: Not on file    Gets together: Not on file    Attends religious service: Not on file    Active member of club or organization: Not on file    Attends meetings of clubs or organizations: Not on file    Relationship status: Not on file  Other Topics  Concern  . Not on file  Social History Narrative  . Not on file     Family History:  Family History  Problem Relation Age of Onset  . Heart attack Father 36       died  . Heart disease Unknown        famil,y hx of  . Heart attack Son 50       died    ROS:   Please see the history of present illness. All other systems are reviewed and otherwise negative.    PHYSICAL EXAM:   VS:  BP 136/84   Pulse 82  Ht 5' 5"  (1.651 m)   Wt 128 lb (58.1 kg)   BMI 21.30 kg/m   BMI: Body mass index is 21.3 kg/m. GEN: Well nourished, well developed WF, in no acute distress  HEENT: normocephalic, atraumatic Neck: no JVD, carotid bruits, or masses Cardiac: irregularly irregular, rate controlled, no murmurs, rubs, or gallops, no edema  Respiratory:  clear to auscultation bilaterally, normal work of breathing GI: soft, nontender, nondistended, + BS MS: no deformity or atrophy  Skin: warm and dry, no rash Neuro:  Alert and Oriented x 3, Strength and sensation are intact, follows commands Psych: euthymic mood, full affect  Wt Readings from Last 3 Encounters:  08/23/18 128 lb (58.1 kg)  01/31/18 123 lb (55.8 kg)  12/27/17 124 lb 1.9 oz (56.3 kg)      Studies/Labs Reviewed:   EKG:  EKG was ordered today and personally reviewed by me and demonstrates atrial fib 88bpm, prior septal infarct, nonspecific ST-T changes. Similar to prior.  Recent Labs: 09/06/2017: Hemoglobin 14.9; Platelets 173 12/27/2017: TSH 7.120 01/31/2018: BUN 31; Creatinine, Ser 1.14; NT-Pro BNP 3,537; Potassium 4.6; Sodium 141   Lipid Panel No results found for: CHOL, TRIG, HDL, CHOLHDL, VLDL, LDLCALC, LDLDIRECT  Additional studies/ records that were reviewed today include: Summarized above.    ASSESSMENT & PLAN:   1. Fatigue -the patient states improvement of symptoms when she does not take metoprolol, this is not ideal as she has chronic combined systolic diastolic heart failure, however I am going to  discontinue and increase Cardizem to 240 mg daily. 2. Chronic atrial fibrillation, is about discontinue metoprolol increase Cardizem, she is strongly encouraged to take Xarelto regularly. 3. Chronic systolic CHF, LVEF 40 to 63%, as stated about the discontinuation of metoprolol is not ideal but will give it a try and continue losartan 25 mg daily.  She is euvolemic.  Lasix only as needed. 4. Abnormal TSH -normal fT4, we will recheck today.  Obtain CBC, CMP, BNP and TSH and free T4 today.  Disposition: F/u in in 2 weeks with pharmacy to check her heart rate and blood pressure, in 4 months with me.  Medication Adjustments/Labs and Tests Ordered: Current medicines are reviewed at length with the patient today.  Concerns regarding medicines are outlined above. Medication changes, Labs and Tests ordered today are summarized above and listed in the Patient Instructions accessible in Encounters.   Signed, Ena Dawley, MD  08/23/2018 2:40 PM    [facility] [address], [place], [place]  [address] Phone: [phone]; Fax: [phone]

## 2018-08-23 NOTE — Addendum Note (Signed)
Addended by: Loren Racer on: 08/23/2018 03:06 PM   Modules accepted: Orders

## 2018-08-24 ENCOUNTER — Telehealth: Payer: Self-pay | Admitting: *Deleted

## 2018-08-24 DIAGNOSIS — E875 Hyperkalemia: Secondary | ICD-10-CM

## 2018-08-24 LAB — CBC
Hematocrit: 44.6 % (ref 34.0–46.6)
Hemoglobin: 14.9 g/dL (ref 11.1–15.9)
MCH: 32.9 pg (ref 26.6–33.0)
MCHC: 33.4 g/dL (ref 31.5–35.7)
MCV: 99 fL — ABNORMAL HIGH (ref 79–97)
Platelets: 171 10*3/uL (ref 150–450)
RBC: 4.53 x10E6/uL (ref 3.77–5.28)
RDW: 12.7 % (ref 12.3–15.4)
WBC: 5.8 10*3/uL (ref 3.4–10.8)

## 2018-08-24 LAB — BASIC METABOLIC PANEL
BUN/Creatinine Ratio: 26 (ref 12–28)
BUN: 34 mg/dL — ABNORMAL HIGH (ref 8–27)
CO2: 24 mmol/L (ref 20–29)
Calcium: 9.6 mg/dL (ref 8.7–10.3)
Chloride: 103 mmol/L (ref 96–106)
Creatinine, Ser: 1.33 mg/dL — ABNORMAL HIGH (ref 0.57–1.00)
GFR calc Af Amer: 42 mL/min/{1.73_m2} — ABNORMAL LOW (ref >59)
GFR calc non Af Amer: 36 mL/min/{1.73_m2} — ABNORMAL LOW (ref >59)
Glucose: 89 mg/dL (ref 65–99)
Potassium: 5.5 mmol/L — ABNORMAL HIGH (ref 3.5–5.2)
Sodium: 142 mmol/L (ref 134–144)

## 2018-08-24 LAB — TSH: TSH: 5.44 u[IU]/mL — ABNORMAL HIGH (ref 0.450–4.500)

## 2018-08-24 LAB — HEPATIC FUNCTION PANEL
ALT: 19 IU/L (ref 0–32)
AST: 27 IU/L (ref 0–40)
Albumin: 4.4 g/dL (ref 3.5–4.7)
Alkaline Phosphatase: 101 IU/L (ref 39–117)
Bilirubin Total: 0.6 mg/dL (ref 0.0–1.2)
Bilirubin, Direct: 0.18 mg/dL (ref 0.00–0.40)
Total Protein: 6.4 g/dL (ref 6.0–8.5)

## 2018-08-24 LAB — PRO B NATRIURETIC PEPTIDE: NT-Pro BNP: 2779 pg/mL — ABNORMAL HIGH (ref 0–738)

## 2018-08-24 LAB — T4, FREE: Free T4: 0.94 ng/dL (ref 0.82–1.77)

## 2018-08-24 NOTE — Telephone Encounter (Signed)
Notified the pt that based on her labs, she had noted high K level, and our Pharmacist Tana Coast recommends that she stop taking her Losartan, monitor her pressures, and we can make adjustments if needed at her follow-up appt with BP clinic on 09/13/18.  Informed the pt that we will recheck her BMET on 09/13/18, same day as she see's Pharmacy in BP clinic.  Advised the pt that if her BP increases dramatically, she should call the office back and we can schedule an earlier appt with BP clinic.  Pt verbalized understanding and agrees with this plan.

## 2018-08-24 NOTE — Telephone Encounter (Signed)
-----   Message from Erskine Emery, [facility] sent at 08/24/2018 12:24 PM EDT ----- Would recommend discontinue losartan. Monitor pressures and we can adjust medications if needed at follow up with [place]. We will recheck BMET at follow up.  If pressures increase dramatically call and we can schedule for earlier appt.   Thanks, Georgina Peer  ----- Message ----- From: Nuala Alpha, LPN Sent: 53/03/1442  11:55 AM EDT To: [name], MD, Erskine Emery, RPH, #  Ralene Cork, any recommendations for this pt?  She is on losartan, lasix, and Cardizem.  She will see you guys in 2 weeks for BP management and follow-up.  [name] out of the office.    Thanks for all your help,  Sara Griffin  ----- Message ----- From: Lavone Neri Lab Results In Sent: 08/24/2018   6:37 AM EDT To: [name], MD

## 2018-08-28 ENCOUNTER — Telehealth: Payer: Self-pay

## 2018-08-28 NOTE — Telephone Encounter (Signed)
Letter received from [facility] via fax stating that they have denied the pts Xarelto tier exception. Reason: Xarelto is already at a tier 3 and cannot be lowered any further as a brand name medication under Medicare Part D rules.  I have made the pt aware of this denial and I offered to send a message to Dr [name] asking her if Xarelto could be changed to another anticoagulation medication due to cost.  The pt declined stating that she is doing well on Xarelto and does not want to change.   I offered to help her apply for pt assistance but the pt states that she is not interested in that as she does not think she will be approved. She states that at the beginning of 2020 the cost of her Xarelto will go back down.  I have asked her to call me back if she finds that she cannot afford her Xarelto. She verbalized understanding and thanked me for helping her with this matter.

## 2018-08-28 NOTE — Telephone Encounter (Signed)
I called [facility] (the pts pharmacy) and was advised that a 30 day supply of Xarelto will cost the pt $117.75.  I did a Xarelto tier exception through covermymeds (Key: [id number]) even though I did receive a message stating that Xarelto is already at a tier 3 and as a name brand medication cannot be lowered any further.

## 2018-08-28 NOTE — Telephone Encounter (Signed)
**Note De-identified Elder Davidian Obfuscation** -----  **Note De-Identified Amonte Brookover Obfuscation** Message from Loren Racer, LPN sent at 26/33/3545  3:09 PM EDT ----- Pt in today to see [name].  States Xarelto is $87/mo.  I gave her and her so the pt assistance forms and told them I would send you a message to see if there was anything we could do to get it cheaper.   Thanks [employer]

## 2018-09-05 ENCOUNTER — Telehealth: Payer: Self-pay

## 2018-09-05 NOTE — Telephone Encounter (Signed)
**Note De-Identified Heber Hoog Obfuscation** The pt called the office and requested that I mail her a [place] pt asst application so she can apply for assistance with Xarelto. I have placed her an application in our out going mail. Also per her request I have left 2 bottles of Xarelto samples in the front office for her to pick up at her next OV later this month.

## 2018-09-13 ENCOUNTER — Other Ambulatory Visit: Payer: Medicare Other | Admitting: *Deleted

## 2018-09-13 ENCOUNTER — Ambulatory Visit (INDEPENDENT_AMBULATORY_CARE_PROVIDER_SITE_OTHER): Payer: Medicare Other | Admitting: Pharmacist

## 2018-09-13 VITALS — BP 142/82 | HR 73

## 2018-09-13 DIAGNOSIS — I10 Essential (primary) hypertension: Secondary | ICD-10-CM | POA: Diagnosis not present

## 2018-09-13 DIAGNOSIS — E875 Hyperkalemia: Secondary | ICD-10-CM

## 2018-09-13 LAB — BASIC METABOLIC PANEL
BUN/Creatinine Ratio: 22 (ref 12–28)
BUN: 31 mg/dL — ABNORMAL HIGH (ref 8–27)
CO2: 22 mmol/L (ref 20–29)
Calcium: 9.5 mg/dL (ref 8.7–10.3)
Chloride: 102 mmol/L (ref 96–106)
Creatinine, Ser: 1.38 mg/dL — ABNORMAL HIGH (ref 0.57–1.00)
GFR calc Af Amer: 40 mL/min/{1.73_m2} — ABNORMAL LOW (ref >59)
GFR calc non Af Amer: 35 mL/min/{1.73_m2} — ABNORMAL LOW (ref >59)
Glucose: 100 mg/dL — ABNORMAL HIGH (ref 65–99)
Potassium: 4.2 mmol/L (ref 3.5–5.2)
Sodium: 141 mmol/L (ref 134–144)

## 2018-09-13 MED ORDER — METOPROLOL SUCCINATE ER 25 MG PO TB24: 25.0000 mg | ORAL_TABLET | Freq: Every day | ORAL | 1 refills | Status: DC

## 2018-09-13 NOTE — Patient Instructions (Addendum)
Return for a follow up appointment in 3-4 weeks  Go to the lab today  Check your blood pressure at home daily (if able) and keep record of the readings.  Take your BP meds as follows: START metorprolol succinate 25mg  daily CONTINUE all other medications as prescribed  Bring all of your meds, your BP cuff and your record of home blood pressures to your next appointment.  Exercise as you're able, try to walk approximately 30 minutes per day.  Keep salt intake to a minimum, especially watch canned and prepared boxed foods.  Eat more fresh fruits and vegetables and fewer canned items.  Avoid eating in fast food restaurants.    HOW TO TAKE YOUR BLOOD PRESSURE: . Rest 5 minutes before taking your blood pressure. .  Don't smoke or drink caffeinated beverages for at least 30 minutes before. . Take your blood pressure before (not after) you eat. . Sit comfortably with your back supported and both feet on the floor (don't cross your legs). . Elevate your arm to heart level on a table or a desk. . Use the proper sized cuff. It should fit smoothly and snugly around your bare upper arm. There should be enough room to slip a fingertip under the cuff. The bottom edge of the cuff should be 1 inch above the crease of the elbow. . Ideally, take 3 measurements at one sitting and record the average.

## 2018-09-13 NOTE — Progress Notes (Signed)
Patient ID: Sara Griffin                 DOB: 1930/08/11                      MRN: [id number]     HPI: Sara Griffin is a 84 y.o. female patient of Dr. Meda Griffin who presents today for hypertension evaluation. PMH significant for chronic atrial fib on coumadin for CHADSVASC=4, HTN, HLD, carotid artery stenosis, and recent acute systolic CHF, moderate MR/TR, possible CKD stage III. She was started on losartan and BMET 1 week after showed increased potassium and losartan was discontinued.   She presents today in good spirits with her son for blood pressure management. Patient reports that she has noticed that her blood pressure is higher now that metoprolol succinate and losartan have been discontinued. She checks her blood pressure daily, but has not checked in the last few weeks because it has been elevated. Patient states that when her blood pressure is very high she takes 25-48m of metoprolol succinate since she still has supply of 1029mtablets at home. She takes it maybe once a week and denies feeling fatigued after taking the medication. Patient reports that when she was on losartan, she was not taking it every day. Patient reports that she only takes furosemide as needed for fluid overload. Patient asks about Xarelto samples that have been kept for her by LyJeani Hawking  Current HTN meds:  Diltiazem 24019maily  Furosemide 95m13mily with extra 1/2 tablet if needed  Previously tried: Toprol (fatigue), losartan (elevated potassium)  BP goal: <140/90  Family History: father with heart attack at age 36, 40n with MI at 38 (27ssed)  Social History: denies tobacco and alcohol  Home BP readings: 160s/90s  Wt Readings from Last 3 Encounters:  08/23/18 128 lb (58.1 kg)  01/31/18 123 lb (55.8 kg)  12/27/17 124 lb 1.9 oz (56.3 kg)   BP Readings from Last 3 Encounters:  08/23/18 136/84  01/31/18 128/78  12/27/17 126/82   Pulse Readings from Last 3 Encounters:  08/23/18 82  01/31/18 84    12/27/17 88    Renal function: CrCl cannot be calculated (Unknown ideal weight.).  Past Medical History:  Diagnosis Date  . Cardiomyopathy (HCC)[place] Echo 1/19: mild LVH, EF 40-45, ant-sept DK, mild AI, MAC, mod MR, severe BAE, mod TR  . Chronic atrial fibrillation (HCC)[place]. Chronic systolic CHF (congestive heart failure) (HCC)[place] a. dx 10/2017 but sx dating back to 08/2017.  . CKD (chronic kidney disease), stage III (HCC)[place] a. ? CKD - baseline Cr 0.8-1.1.  . Hx of colonic polyps   . Hyperlipidemia   . Hypertension   . Mild carotid artery disease (HCC)[place] a. Duplex 2014 mild carotid plaque 0-39% BICA.  . MoMarland Kitchenerate mitral regurgitation 10/2017  . Moderate tricuspid regurgitation 10/2017  . PVC's (premature ventricular contractions)   [facility]  Current Outpatient Medications on File Prior to Visit  Medication Sig Dispense Refill  . diltiazem (CARDIZEM CD) 240 MG 24 hr capsule Take 1 capsule (240 mg total) by mouth daily. 90 capsule 3  . furosemide (LASIX) 40 MG tablet Take 1 tablet by mouth daily. May take extra 1/2 tab daily as needed for swelling or weight gain 120 tablet 3  . XARELTO 15 MG TABS tablet Take 1 tablet by mouth daily with  supper.   11  . [DISCONTINUED] dabigatran (PRADAXA) 150 MG CAPS Take 1 capsule (150 mg total) by mouth every 12 (twelve) hours. 60 capsule 6   No current facility-administered medications on file prior to visit.     Allergies  Allergen Reactions  . Hydrocod Polst-Cpm Polst Er Other (See Comments)    Felt bad    There were no vitals taken for this visit.   Assessment/Plan: Hypertension: Blood pressure above goal of <140/90. Given patient's new history of heart failure, the goal is to try and get her on as much GDMT as she can tolerate. Since patient's potassium spiked with initiation of losartan, may not consider an ACEi or an ARB again. Patient is taking metoprolol succinate as needed for high blood pressure and is not  feeling fatigued like she did in the past on higher doses of metoprolol succinate. Initiate metoprolol succinate 33m daily. BMET today to check serum potassium after discontinuation of losartan. Counseled patient to continue checking her blood pressure daily (if possible), ~2 hours after taking her medication. Discussed possibility of discontinuing diltiazem in the future, given patient's new diagnosis of heart failure. Provided patients with Xarelto samples to last her through the end of the year. Follow up with patient in January for blood pressure check.   Patient seen with RCleotis Lema PharmD Student  Thank you, KLelan Pons APatterson Hammersmith [facility]  09/13/2018 7:50 AM

## 2018-10-23 ENCOUNTER — Encounter

## 2018-10-25 ENCOUNTER — Ambulatory Visit: Payer: Medicare Other

## 2018-10-26 ENCOUNTER — Ambulatory Visit: Payer: Medicare Other

## 2018-12-06 ENCOUNTER — Encounter: Payer: Self-pay | Admitting: Cardiology

## 2018-12-24 ENCOUNTER — Encounter: Payer: Self-pay | Admitting: Cardiology

## 2018-12-24 ENCOUNTER — Ambulatory Visit (INDEPENDENT_AMBULATORY_CARE_PROVIDER_SITE_OTHER): Payer: Medicare Other | Admitting: Cardiology

## 2018-12-24 ENCOUNTER — Other Ambulatory Visit: Payer: Self-pay | Admitting: *Deleted

## 2018-12-24 VITALS — BP 136/84 | HR 82 | Ht 62.0 in | Wt 125.2 lb

## 2018-12-24 DIAGNOSIS — E785 Hyperlipidemia, unspecified: Secondary | ICD-10-CM | POA: Diagnosis not present

## 2018-12-24 DIAGNOSIS — I340 Nonrheumatic mitral (valve) insufficiency: Secondary | ICD-10-CM | POA: Diagnosis not present

## 2018-12-24 DIAGNOSIS — I10 Essential (primary) hypertension: Secondary | ICD-10-CM

## 2018-12-24 DIAGNOSIS — I482 Chronic atrial fibrillation, unspecified: Secondary | ICD-10-CM | POA: Diagnosis not present

## 2018-12-24 NOTE — Patient Instructions (Signed)
Medication Instructions:   Your physician recommends that you continue on your current medications as directed. Please refer to the Current Medication list given to you today.  If you need a refill on your cardiac medications before your next appointment, please call your pharmacy.     Lab work:  A FEW DAYS PRIOR TO YOUR APPOINTMENT WITH DR [name] ON 04/17/19---WE WILL CHECK CMET, CBC W DIFF, TSH, FREE T4, AND LIPIDS--PLEASE COME FASTING TO THAT LAB APPOINTMENT   If you have labs (blood work) drawn today and your tests are completely normal, you will receive your results only by: Marland Kitchen MyChart Message (if you have MyChart) OR . A paper copy in the mail If you have any lab test that is abnormal or we need to change your treatment, we will call you to review the results.     Follow-Up:  ON April 17, 2019 AT 1:40 PM WITH DR NELSON--ADD TO THIS SLOT PER DR NELSON--PLEASE SCHEDULE YOUR LAB APPOINTMENT A FEW DAYS PRIOR TO THIS APPOINTMENT  SCHEDULE YOUR LAB APPOINTMENT A FEW DAYS PRIOR TO YOUR 04/17/19 FOLLOW-UP APPOINTMENT WITH DR [name]

## 2018-12-24 NOTE — Progress Notes (Signed)
Cardiology Office Note    Date:  12/24/2018  ID:  Sara Griffin, DOB 12-18-1929, MRN [id number] PCP:  Sara Greenhouse, MD  Cardiologist:  Dr. Meda Griffin   Chief Complaint: f/u CHF, afib  History of Present Illness:  Sara Griffin is a 85 y.o. female with history of chronic atrial fib on coumadin for CHADSVASC=4, HTN, HLD, carotid artery stenosis, and recent acute systolic CHF, moderate MR/TR, possible CKD stage III who presents for f/u CHF.  She has been followed for CHF symptoms recently. She was seen by Dr. Meda Griffin in 08/2017 with SOB and edema. She was started on Lasix 5m (to use 467mdaily when weight goes up by 3lb overnight). When seen in followup by MiErmalinda Griffin/7/19 she complained of worsened orthopnea, edema, and palpitations in the setting of Lasix 4075maily. HR was in the 70s but diltiazem was increased to 240m97mily in case this represented increased rates. BNP remained high at 4473, prompting increase in Lasix to 60mg15mly x 3 days then back to 40mg 43my. Otherwise BUN 29/Cr 1.22, K 5.0 (previously 0.8-1.1 in 2018). Last CBC in 08/2017 was normal. Repeat labs 1/18 showed BNP 3208, BUN 29, Cr 1.14, K 5.0. Echo 11/10/17 showed mild LVH, EF 40-45%, dyskinesis of anteroseptal myocardium, moderate mitral regurgitation, severely dilated LA/RA, moderate TR. Other studies reviewed include carotid duplex 2014 with 0-39% stenosis and nuclear stress test 02/2016 which was negative for ischemia. When I saw her 1/21 she was feeling better. Given LV dysfunction we changed diltiazem to Toprol after discussing with Dr. NelsonMeda Coffeeinitially felt worse with the change but this was suspected due to decreased rate control so the dose of metoprolol was further titrated. Holter monitor in February 2019 showed atrial fib with infrequent PVCs, frequent couplets, couple of NSVT with longest lasting 3 beats. Avg HR 102. Dr. NelsonMeda Griffin Cardizem 120mg b34mto her regimen per result note. Recent TSH was  5.530 and was advised to f/u PCP. Since last visit, her PCP switched her from Warfarin to Xarelto 15mg da33m  She returns for follow-up with her son today overall feeling well. Has good days and bad days but generally feeling better. Volume status is stable on Lasix 40mg dai51m she now has a 40mg tab 47mome per fill 10/2017. We spent a lot of time clarifying her medication list. She tends to save old med recs and also consults people at church as well as the internet for information on her medication, further complicating her comprehension of what she's taking. Son states he will be stepping in to help with meds.  01/31/2018 - this is one month follow-up, the patient states that she feels significantly better, she now doesn't feel palpitations, denies any dizziness falls or syncope. She has no orthopnea or paroxysmal nocturnal dyspnea.she has intermittent lower extremity edema that she controls with daily Lasix and extra half a pill of Lasix as needed.denies any bleeding.  08/23/2018, she is coming after 6 months and states that she has been feeling profoundly tired after taking her medications, she has cut down her Toprol-XL to 50 or none at all with improvement of her symptoms and she increased her Cardizem to 240 mg daily, she also takes her Xarelto on and off.  She is experiencing some bruising but no bleeding.  12/24/2018 -this is 6 months follow-up, the patient has been doing well, denies any palpitation dizziness or syncope, she has occasional lightheadedness when she gets up quickly but no  falls.  Denies any chest pain or shortness of breath, she drives and lives independently.  She has been experiencing bleeding with hemorrhoids but that has resolved.  Her most recent hemoglobin 14.9.  Past Medical History:  Diagnosis Date  . Cardiomyopathy ([place])    Echo 1/19: mild LVH, EF 40-45, ant-sept DK, mild AI, MAC, mod MR, severe BAE, mod TR  . Chronic atrial fibrillation   . Chronic systolic CHF  (congestive heart failure) ([place])    a. dx 10/2017 but sx dating back to 08/2017.  . CKD (chronic kidney disease), stage III ([place])    a. ? CKD - baseline Cr 0.8-1.1.  . Hx of colonic polyps   . Hyperlipidemia   . Hypertension   . Mild carotid artery disease ([place])    a. Duplex 2014 mild carotid plaque 0-39% BICA.  Marland Kitchen Moderate mitral regurgitation 10/2017  . Moderate tricuspid regurgitation 10/2017  . PVC's (premature ventricular contractions)   . Uterine cancer [facility])     Past Surgical History:  Procedure Laterality Date  . ABDOMINAL HYSTERECTOMY    . PARTIAL COLECTOMY      Current Medications: Current Meds  Medication Sig  . Cholecalciferol (VITAMIN D3) 125 MCG (5000 UT) TABS Take by mouth.  . furosemide (LASIX) 40 MG tablet Take 40 mg by mouth daily as needed.  Marland Kitchen losartan (COZAAR) 25 MG tablet Take 25 mg by mouth daily as needed.   Alveda Reasons 15 MG TABS tablet Take 1 tablet by mouth daily with supper.      Allergies:   Hydrocod polst-cpm polst er   Social History   Socioeconomic History  . Marital status: Married    Spouse name: Not on file  . Number of children: 5  . Years of education: Not on file  . Highest education level: Not on file  Occupational History  . Not on file  Social Needs  . Financial resource strain: Not on file  . Food insecurity:    Worry: Not on file    Inability: Not on file  . Transportation needs:    Medical: Not on file    Non-medical: Not on file  Tobacco Use  . Smoking status: Never Smoker  . Smokeless tobacco: Never Used  Substance and Sexual Activity  . Alcohol use: No  . Drug use: No  . Sexual activity: Not on file  Lifestyle  . Physical activity:    Days per week: Not on file    Minutes per session: Not on file  . Stress: Not on file  Relationships  . Social connections:    Talks on phone: Not on file    Gets together: Not on file    Attends religious service: Not on file    Active member of club or organization: Not on  file    Attends meetings of clubs or organizations: Not on file    Relationship status: Not on file  Other Topics Concern  . Not on file  Social History Narrative  . Not on file     Family History:  Family History  Problem Relation Age of Onset  . Heart attack Father 47       died  . Heart disease Other        famil,y hx of  . Heart attack Son 5       died    ROS:   Please see the history of present illness. All other systems are reviewed and otherwise negative.  PHYSICAL EXAM:   VS:  BP 136/84   Pulse 82   Ht _0  (1.575 m)   Wt 125 lb 3.2 oz (56.8 kg)   SpO2 98%   BMI 22.90 kg/m   BMI: Body mass index is 22.9 kg/m. GEN: Well nourished, well developed WF, in no acute distress  HEENT: normocephalic, atraumatic Neck: no JVD, carotid bruits, or masses Cardiac: irregularly irregular, rate controlled, no murmurs, rubs, or gallops, no edema  Respiratory:  clear to auscultation bilaterally, normal work of breathing GI: soft, nontender, nondistended, + BS MS: no deformity or atrophy  Skin: warm and dry, no rash Neuro:  Alert and Oriented x 3, Strength and sensation are intact, follows commands Psych: euthymic mood, full affect  Wt Readings from Last 3 Encounters:  12/24/18 125 lb 3.2 oz (56.8 kg)  08/23/18 128 lb (58.1 kg)  01/31/18 123 lb (55.8 kg)      Studies/Labs Reviewed:   EKG:  EKG was ordered today and personally reviewed by me and demonstrates atrial fib 88bpm, prior septal infarct, nonspecific ST-T changes. Similar to prior.  Recent Labs: 08/23/2018: ALT 19; Hemoglobin 14.9; NT-Pro BNP 2,779; Platelets 171; TSH 5.440 09/13/2018: BUN 31; Creatinine, Ser 1.38; Potassium 4.2; Sodium 141   Lipid Panel No results found for: CHOL, TRIG, HDL, CHOLHDL, VLDL, LDLCALC, LDLDIRECT  Additional studies/ records that were reviewed today include: Summarized above.    ASSESSMENT & PLAN:   1. Chronic atrial fibrillation, continue Cardizem 240 CD daily her  blood pressure is well controlled as well, continue Xarelto, she had hemorrhoidal bleed but hemoglobin 14.9, will recheck at the next visit.   2. Chronic systolic CHF, LVEF 40 to 14%, losartan was discontinued by primary care physician for worsening kidney function, she now takes it only as needed for elevated blood pressure  3. abnormal TSH -normal fT4 in October 2019, we will recheck at the next visit.  Obtain CBC, CMP, BNP and TSH and free T4 today.  Disposition: F/u in 4 months with labs including CBC, CMP, lipids and TSH, free T4.  Medication Adjustments/Labs and Tests Ordered: Current medicines are reviewed at length with the patient today.  Concerns regarding medicines are outlined above. Medication changes, Labs and Tests ordered today are summarized above and listed in the Patient Instructions accessible in Encounters.   Signed, Ena Dawley, MD  12/24/2018 10:06 AM    [address], [place], [place]  [address] Phone: [phone]; Fax: [phone]

## 2019-04-08 ENCOUNTER — Telehealth: Payer: Self-pay | Admitting: *Deleted

## 2019-04-08 NOTE — Telephone Encounter (Signed)
LMOM for pt to call the office to discuss video vs telephone virtual visit and to obtain consent.

## 2019-04-08 NOTE — Telephone Encounter (Signed)
Follow up: ° ° ° °Patient returning your call back. Please call patient. °

## 2019-04-08 NOTE — Telephone Encounter (Signed)
Returned call to patient and she will do a telephone call visit. Consent obtained as below.   ..   Virtual Visit Pre-Appointment Phone Call  "(Name), I am calling you today to discuss your upcoming appointment. We are currently trying to limit exposure to the virus that causes COVID-19 by seeing patients at home rather than in the office."  1. "What is the BEST phone number to call the day of the visit?" - include this in appointment notes  2. Do you have or have access to (through a family member/friend) a smartphone with video capability that we can use for your visit?" a. If yes - list this number in appt notes as cell (if different from BEST phone #) and list the appointment type as a VIDEO visit in appointment notes b. If no - list the appointment type as a PHONE visit in appointment notes  3. Confirm consent - "In the setting of the current Covid19 crisis, you are scheduled for a (phone or video) visit with your provider on (date) at (time).  Just as we do with many in-office visits, in order for you to participate in this visit, we must obtain consent.  If you'd like, I can send this to your mychart (if signed up) or email for you to review.  Otherwise, I can obtain your verbal consent now.  All virtual visits are billed to your insurance company just like a normal visit would be.  By agreeing to a virtual visit, we'd like you to understand that the technology does not allow for your provider to perform an examination, and thus may limit your provider's ability to fully assess your condition. If your provider identifies any concerns that need to be evaluated in person, we will make arrangements to do so.  Finally, though the technology is pretty good, we cannot assure that it will always work on either your or our end, and in the setting of a video visit, we may have to convert it to a phone-only visit.  In either situation, we cannot ensure that we have a secure connection.  Are you willing  to proceed?" STAFF: Did the patient verbally acknowledge consent to telehealth visit? Document YES/NO here: YES  4. Advise patient to be prepared - "Two hours prior to your appointment, go ahead and check your blood pressure, pulse, oxygen saturation, and your weight (if you have the equipment to check those) and write them all down. When your visit starts, your provider will ask you for this information. If you have an Apple Watch or Kardia device, please plan to have heart rate information ready on the day of your appointment. Please have a pen and paper handy nearby the day of the visit as well."  5. Give patient instructions for MyChart download to smartphone OR Doximity/Doxy.me as below if video visit (depending on what platform provider is using)PATIENT DOES NOT HAVE A SMARTPHONE, WILL DO PHONE CALL VISIT  6. Inform patient they will receive a phone call 15 minutes prior to their appointment time (may be from unknown caller ID) so they should be prepared to answer    [place] has been deemed a candidate for a follow-up tele-health visit to limit community exposure during the Covid-19 pandemic. I spoke with the patient via phone to ensure availability of phone/video source, confirm preferred email & phone number, and discuss instructions and expectations.  I reminded [place] to be prepared with any vital sign and/or  heart rhythm information that could potentially be obtained via home monitoring, at the time of her visit. I reminded [place] to expect a phone call prior to her visit.  Juventino Slovak, CMA 04/08/2019 3:44 PM   IF USING DOXIMITY or DOXY.ME - The patient will receive a link just prior to their visit by text.PATIENT DOES NOT HAVE A SMARTPHONE, WILL DO PHONE CALL VISIT     FULL LENGTH CONSENT FOR TELE-HEALTH VISIT   I hereby voluntarily request, consent and authorize [facility] and its employed or contracted physicians,  physician assistants, nurse practitioners or other licensed health care professionals (the Practitioner), to provide me with telemedicine health care services (the Services") as deemed necessary by the treating Practitioner. I acknowledge and consent to receive the Services by the Practitioner via telemedicine. I understand that the telemedicine visit will involve communicating with the Practitioner through live audiovisual communication technology and the disclosure of certain medical information by electronic transmission. I acknowledge that I have been given the opportunity to request an in-person assessment or other available alternative prior to the telemedicine visit and am voluntarily participating in the telemedicine visit.  I understand that I have the right to withhold or withdraw my consent to the use of telemedicine in the course of my care at any time, without affecting my right to future care or treatment, and that the Practitioner or I may terminate the telemedicine visit at any time. I understand that I have the right to inspect all information obtained and/or recorded in the course of the telemedicine visit and may receive copies of available information for a reasonable fee.  I understand that some of the potential risks of receiving the Services via telemedicine include:   Delay or interruption in medical evaluation due to technological equipment failure or disruption;  Information transmitted may not be sufficient (e.g. poor resolution of images) to allow for appropriate medical decision making by the Practitioner; and/or   In rare instances, security protocols could fail, causing a breach of personal health information.  Furthermore, I acknowledge that it is my responsibility to provide information about my medical history, conditions and care that is complete and accurate to the best of my ability. I acknowledge that Practitioner's advice, recommendations, and/or decision may be  based on factors not within their control, such as incomplete or inaccurate data provided by me or distortions of diagnostic images or specimens that may result from electronic transmissions. I understand that the practice of medicine is not an exact science and that Practitioner makes no warranties or guarantees regarding treatment outcomes. I acknowledge that I will receive a copy of this consent concurrently upon execution via email to the email address I last provided but may also request a printed copy by calling the office of [place].    I understand that my insurance will be billed for this visit.   I have read or had this consent read to me.  I understand the contents of this consent, which adequately explains the benefits and risks of the Services being provided via telemedicine.   I have been provided ample opportunity to ask questions regarding this consent and the Services and have had my questions answered to my satisfaction.  I give my informed consent for the services to be provided through the use of telemedicine in my medical care  By participating in this telemedicine visit I agree to the above.

## 2019-04-12 ENCOUNTER — Telehealth: Payer: Self-pay

## 2019-04-12 NOTE — Telephone Encounter (Signed)
    COVID-19 Pre-Screening Questions:  . In the past 7 to 10 days have you had a cough,  shortness of breath, headache, congestion, fever (100 or greater) body aches, chills, sore throat, or sudden loss of taste or sense of smell? . Have you been around anyone with known Covid 19. . Have you been around anyone who is awaiting Covid 19 test results in the past 7 to 10 days? . Have you been around anyone who has been exposed to Covid 19, or has mentioned symptoms of Covid 19 within the past 7 to 10 days?  If you have any concerns/questions about symptoms patients report during screening (either on the phone or at threshold). Contact the provider seeing the patient or DOD for further guidance.  If neither are available contact a member of the leadership team.          Pt answered NO to all questions. kb

## 2019-04-15 ENCOUNTER — Other Ambulatory Visit: Payer: Self-pay

## 2019-04-15 ENCOUNTER — Encounter (INDEPENDENT_AMBULATORY_CARE_PROVIDER_SITE_OTHER): Payer: Self-pay

## 2019-04-15 ENCOUNTER — Other Ambulatory Visit: Payer: Medicare Other | Admitting: *Deleted

## 2019-04-15 DIAGNOSIS — I10 Essential (primary) hypertension: Secondary | ICD-10-CM

## 2019-04-15 DIAGNOSIS — I340 Nonrheumatic mitral (valve) insufficiency: Secondary | ICD-10-CM

## 2019-04-15 DIAGNOSIS — E785 Hyperlipidemia, unspecified: Secondary | ICD-10-CM

## 2019-04-15 DIAGNOSIS — I482 Chronic atrial fibrillation, unspecified: Secondary | ICD-10-CM

## 2019-04-16 ENCOUNTER — Telehealth: Payer: Self-pay | Admitting: *Deleted

## 2019-04-16 LAB — CBC WITH DIFFERENTIAL/PLATELET
Basophils Absolute: 0 10*3/uL (ref 0.0–0.2)
Basos: 0 %
EOS (ABSOLUTE): 0.2 10*3/uL (ref 0.0–0.4)
Eos: 4 %
Hematocrit: 45.4 % (ref 34.0–46.6)
Hemoglobin: 15.7 g/dL (ref 11.1–15.9)
Immature Grans (Abs): 0 10*3/uL (ref 0.0–0.1)
Immature Granulocytes: 0 %
Lymphocytes Absolute: 1.3 10*3/uL (ref 0.7–3.1)
Lymphs: 21 %
MCH: 33.3 pg — ABNORMAL HIGH (ref 26.6–33.0)
MCHC: 34.6 g/dL (ref 31.5–35.7)
MCV: 96 fL (ref 79–97)
Monocytes Absolute: 0.6 10*3/uL (ref 0.1–0.9)
Monocytes: 11 %
Neutrophils Absolute: 3.9 10*3/uL (ref 1.4–7.0)
Neutrophils: 64 %
Platelets: 165 10*3/uL (ref 150–450)
RBC: 4.71 x10E6/uL (ref 3.77–5.28)
RDW: 12.4 % (ref 11.7–15.4)
WBC: 6 10*3/uL (ref 3.4–10.8)

## 2019-04-16 LAB — LIPID PANEL
Chol/HDL Ratio: 2.6 ratio (ref 0.0–4.4)
Cholesterol, Total: 219 mg/dL — ABNORMAL HIGH (ref 100–199)
HDL: 83 mg/dL (ref >39)
LDL Calculated: 115 mg/dL — ABNORMAL HIGH (ref 0–99)
Triglycerides: 103 mg/dL (ref 0–149)
VLDL Cholesterol Cal: 21 mg/dL (ref 5–40)

## 2019-04-16 LAB — COMPREHENSIVE METABOLIC PANEL
ALT: 21 IU/L (ref 0–32)
AST: 23 IU/L (ref 0–40)
Albumin/Globulin Ratio: 2.4 — ABNORMAL HIGH (ref 1.2–2.2)
Albumin: 4.3 g/dL (ref 3.6–4.6)
Alkaline Phosphatase: 99 IU/L (ref 39–117)
BUN/Creatinine Ratio: 23 (ref 12–28)
BUN: 28 mg/dL — ABNORMAL HIGH (ref 8–27)
Bilirubin Total: 1.2 mg/dL (ref 0.0–1.2)
CO2: 23 mmol/L (ref 20–29)
Calcium: 9.3 mg/dL (ref 8.7–10.3)
Chloride: 101 mmol/L (ref 96–106)
Creatinine, Ser: 1.23 mg/dL — ABNORMAL HIGH (ref 0.57–1.00)
GFR calc Af Amer: 46 mL/min/{1.73_m2} — ABNORMAL LOW (ref >59)
GFR calc non Af Amer: 40 mL/min/{1.73_m2} — ABNORMAL LOW (ref >59)
Globulin, Total: 1.8 g/dL (ref 1.5–4.5)
Glucose: 94 mg/dL (ref 65–99)
Potassium: 4.2 mmol/L (ref 3.5–5.2)
Sodium: 139 mmol/L (ref 134–144)
Total Protein: 6.1 g/dL (ref 6.0–8.5)

## 2019-04-16 LAB — T4, FREE: Free T4: 1 ng/dL (ref 0.82–1.77)

## 2019-04-16 LAB — TSH: TSH: 5.44 u[IU]/mL — ABNORMAL HIGH (ref 0.450–4.500)

## 2019-04-16 NOTE — Telephone Encounter (Signed)
    COVID-19 Pre-Screening Questions:  . In the past 7 to 10 days have you had a cough,  shortness of breath, headache, congestion, fever (100 or greater) body aches, chills, sore throat, or sudden loss of taste or sense of smell?-NO . Have you been around anyone with known Covid 19.-NO . Have you been around anyone who is awaiting Covid 19 test results in the past 7 to 10 days?-NO . Have you been around anyone who has been exposed to Covid 19, or has mentioned symptoms of Covid 19 within the past 7 to 10 days?-NO   Questions complete and pt is aware of mask policy, and no visitor policy for her upcoming appt with Dr [name] tomorrow 6/24 at 1040.  Pt verbalized understanding and agrees with this plan.

## 2019-04-16 NOTE — Telephone Encounter (Signed)
Left the pt a detailed message to call the office back and ask for myself or Dr. Francesca Oman CMA Bevelyn Buckles, so that we can do COVID PRE-SCREENING QUESTIONS on the pt, per protocol, before her OV appt with Dr [name] tomorrow 04/17/19 at 1040.

## 2019-04-17 ENCOUNTER — Other Ambulatory Visit: Payer: Self-pay

## 2019-04-17 ENCOUNTER — Ambulatory Visit (INDEPENDENT_AMBULATORY_CARE_PROVIDER_SITE_OTHER): Payer: Medicare Other | Admitting: Cardiology

## 2019-04-17 ENCOUNTER — Encounter: Payer: Self-pay | Admitting: Cardiology

## 2019-04-17 VITALS — BP 148/80 | HR 60 | Ht 64.0 in | Wt 128.0 lb

## 2019-04-17 DIAGNOSIS — I10 Essential (primary) hypertension: Secondary | ICD-10-CM

## 2019-04-17 DIAGNOSIS — I482 Chronic atrial fibrillation, unspecified: Secondary | ICD-10-CM | POA: Diagnosis not present

## 2019-04-17 NOTE — Patient Instructions (Addendum)
Medication Instructions:  Your physician has recommended you make the following change in your medication:  1.  TAKE 1 Lasix 20 mg tablet daily for 3 days then go back to as needed  If you need a refill on your cardiac medications before your next appointment, please call your pharmacy.   Lab work: None ordered If you have labs (blood work) drawn today and your tests are completely normal, you will receive your results only by: Marland Kitchen MyChart Message (if you have MyChart) OR . A paper copy in the mail If you have any lab test that is abnormal or we need to change your treatment, we will call you to review the results.  Testing/Procedures:  None ordered  Follow-Up: At [facility], you and your health needs are our priority.  As part of our continuing mission to provide you with exceptional heart care, we have created designated Provider Care Teams.  These Care Teams include your primary Cardiologist (physician) and Advanced Practice Providers (APPs -  Physician Assistants and Nurse Practitioners) who all work together to provide you with the care you need, when you need it. You will need a follow up appointment in 6 months.  Please call our office 2 months in advance to schedule this appointment.  You may see Ena Dawley, MD or one of the following Advanced Practice Providers on your designated Care Team:   [place], PA-C Melina Copa, PA-C . Ermalinda Barrios, PA-C  Any Other Special Instructions Will Be Listed Below (If Applicable).

## 2019-04-17 NOTE — Progress Notes (Signed)
Cardiology Office Note    Date:  04/17/2019  ID:  [name], DOB June 11, 1930, MRN [id number] PCP:  Sara Greenhouse, MD  Cardiologist:  Dr. Meda Griffin   Chief Complaint: f/u CHF, afib  History of Present Illness:  Sara Griffin is a 85 y.o. female with history of chronic atrial fib on coumadin for CHADSVASC=4, HTN, HLD, carotid artery stenosis, and recent acute systolic CHF, moderate MR/TR, possible CKD stage III who presents for f/u CHF. She was seen by Dr. Meda Griffin in 08/2017 with SOB and edema. She was started on Lasix 13m (to use 414mdaily when weight goes up by 3lb overnight). When seen in followup by MiErmalinda Griffin/7/19 she complained of worsened orthopnea, edema, and palpitations in the setting of Lasix 4019maily. HR was in the 70s but diltiazem was increased to 240m76mily in case this represented increased rates. BNP remained high at 4473, prompting increase in Lasix to 60mg21mly x 3 days then back to 40mg 53my. Otherwise BUN 29/Cr 1.22, K 5.0 (previously 0.8-1.1 in 2018). Last CBC in 08/2017 was normal. Repeat labs 1/18 showed BNP 3208, BUN 29, Cr 1.14, K 5.0. Echo 11/10/17 showed mild LVH, EF 40-45%, dyskinesis of anteroseptal myocardium, moderate mitral regurgitation, severely dilated LA/RA, moderate TR. Other studies reviewed include carotid duplex 2014 with 0-39% stenosis and nuclear stress test 02/2016 which was negative for ischemia. When I saw her 1/21 she was feeling better. Given LV dysfunction we changed diltiazem to Toprol after discussing with Dr. NelsonMeda Coffeeinitially felt worse with the change but this was suspected due to decreased rate control so the dose of metoprolol was further titrated. Holter monitor in February 2019 showed atrial fib with infrequent PVCs, frequent couplets, couple of NSVT with longest lasting 3 beats. Avg HR 102. Dr. NelsonMeda Griffin Cardizem 120mg b44mto her regimen per result note. Recent TSH was 5.530 and was advised to f/u PCP. Since last visit,  her PCP switched her from Warfarin to Xarelto 15mg da57m  She returns for follow-up with her son today overall feeling well. Has good days and bad days but generally feeling better. Volume status is stable on Lasix 40mg dai1m she now has a 40mg tab 69mome per fill 10/2017. We spent a lot of time clarifying her medication list. She tends to save old med recs and also consults people at church as well as the internet for information on her medication, further complicating her comprehension of what she's taking. Son states he will be stepping in to help with meds.  04/16/2019 -this is a 6 months follow-up, the patient is doing well except she feels slightly tired in the afternoons, she is still fully independent she is able to drive and perform all activities of daily living.  She denies any bleeding.  She has been compliant with her medications.  She denies any chest pain or shortness of breath.  She has had very mild lower extremity edema around her ankles in the last few days.  Past Medical History:  Diagnosis Date  . Cardiomyopathy (HCC)    EcZap1/19: mild LVH, EF 40-45, ant-sept DK, mild AI, MAC, mod MR, severe BAE, mod TR  . Chronic atrial fibrillation   . Chronic systolic CHF (congestive heart failure) (HCC)    a.[place] 10/2017 but sx dating back to 08/2017.  . CKD (chronic kidney disease), stage III (HCC)    a.[place] - baseline Cr 0.8-1.1.  . Hx of colonic  polyps   . Hyperlipidemia   . Hypertension   . Mild carotid artery disease ([place])    a. Duplex 2014 mild carotid plaque 0-39% BICA.  Marland Kitchen Moderate mitral regurgitation 10/2017  . Moderate tricuspid regurgitation 10/2017  . PVC's (premature ventricular contractions)   . Uterine cancer [facility])     Past Surgical History:  Procedure Laterality Date  . ABDOMINAL HYSTERECTOMY    . PARTIAL COLECTOMY      Current Medications: Current Meds  Medication Sig  . Cholecalciferol (VITAMIN D3) 125 MCG (5000 UT) TABS Take by mouth.  . furosemide  (LASIX) 40 MG tablet Take 40 mg by mouth daily as needed.  Marland Kitchen losartan (COZAAR) 25 MG tablet Take 25 mg by mouth daily as needed.   Alveda Reasons 15 MG TABS tablet Take 1 tablet by mouth daily with supper.      Allergies:   Hydrocod polst-cpm polst er   Social History   Socioeconomic History  . Marital status: Married    Spouse name: Not on file  . Number of children: 5  . Years of education: Not on file  . Highest education level: Not on file  Occupational History  . Not on file  Social Needs  . Financial resource strain: Not on file  . Food insecurity    Worry: Not on file    Inability: Not on file  . Transportation needs    Medical: Not on file    Non-medical: Not on file  Tobacco Use  . Smoking status: Never Smoker  . Smokeless tobacco: Never Used  Substance and Sexual Activity  . Alcohol use: No  . Drug use: No  . Sexual activity: Not on file  Lifestyle  . Physical activity    Days per week: Not on file    Minutes per session: Not on file  . Stress: Not on file  Relationships  . Social Herbalist on phone: Not on file    Gets together: Not on file    Attends religious service: Not on file    Active member of club or organization: Not on file    Attends meetings of clubs or organizations: Not on file    Relationship status: Not on file  Other Topics Concern  . Not on file  Social History Narrative  . Not on file     Family History:  Family History  Problem Relation Age of Onset  . Heart attack Father 92       died  . Heart disease Other        famil,y hx of  . Heart attack Son 11       died    ROS:   Please see the history of present illness. All other systems are reviewed and otherwise negative.    PHYSICAL EXAM:   VS:  BP (!) 148/80   Pulse 60   Ht _0  (1.626 m)   Wt 128 lb (58.1 kg)   SpO2 97%   BMI 21.97 kg/m   BMI: Body mass index is 21.97 kg/m. GEN: Well nourished, well developed WF, in no acute distress  HEENT:  normocephalic, atraumatic Neck: no JVD, carotid bruits, or masses Cardiac: irregularly irregular, rate controlled, no murmurs, rubs, or gallops, mild bilateral edema around her ankles. Respiratory:  clear to auscultation bilaterally, normal work of breathing GI: soft, nontender, nondistended, + BS MS: no deformity or atrophy  Skin: warm and dry, no rash Neuro:  Alert and Oriented  x 3, Strength and sensation are intact, follows commands Psych: euthymic mood, full affect  Wt Readings from Last 3 Encounters:  04/17/19 128 lb (58.1 kg)  12/24/18 125 lb 3.2 oz (56.8 kg)  08/23/18 128 lb (58.1 kg)      Studies/Labs Reviewed:   EKG:  EKG was ordered today and personally reviewed by me and demonstrates atrial fib 88bpm, prior septal infarct, nonspecific ST-T changes. Similar to prior.  Recent Labs: 08/23/2018: NT-Pro BNP 2,779 04/15/2019: ALT 21; BUN 28; Creatinine, Ser 1.23; Hemoglobin 15.7; Platelets 165; Potassium 4.2; Sodium 139; TSH 5.440   Lipid Panel    Component Value Date/Time   CHOL 219 (H) 04/15/2019 0737   TRIG 103 04/15/2019 0737   HDL 83 04/15/2019 0737   CHOLHDL 2.6 04/15/2019 0737   LDLCALC 115 (H) 04/15/2019 0737    Additional studies/ records that were reviewed today include: Summarized above.    ASSESSMENT & PLAN:   1. chronic atrial fibrillation -she is on Xarelto with no bleeding, hemoglobin 15.7.  She is well rate controlled. 2. Acute on chronic systolic CHF, LVEF 40 to 18%, she has mild lower extremity edema she is advised to take Lasix 20 mg daily for the next 3 days and afterwards as needed. 3. Abnormal TSH -normal fT4, this was normal on blood work on Monday.  Disposition: F/u in 6 months.  Medication Adjustments/Labs and Tests Ordered: Current medicines are reviewed at length with the patient today.  Concerns regarding medicines are outlined above. Medication changes, Labs and Tests ordered today are summarized above and listed in the Patient  Instructions accessible in Encounters.   Signed, Sara Dawley, MD  04/17/2019 11:06 AM    [address], [place], [place]  [address] Phone: [phone]; Fax: [phone]

## 2019-05-03 ENCOUNTER — Other Ambulatory Visit: Payer: Self-pay | Admitting: Cardiology

## 2019-05-07 ENCOUNTER — Other Ambulatory Visit: Payer: Self-pay | Admitting: Cardiology

## 2019-05-07 ENCOUNTER — Telehealth: Payer: Self-pay | Admitting: Cardiology

## 2019-05-07 MED ORDER — DILTIAZEM HCL ER COATED BEADS 120 MG PO CP24: 120.0000 mg | ORAL_CAPSULE | Freq: Every day | ORAL | 1 refills | Status: DC

## 2019-05-07 NOTE — Telephone Encounter (Signed)
Please cut down Cardizem CD to 120 mg daily.

## 2019-05-07 NOTE — Telephone Encounter (Signed)
Spoke with the pt and informed her that per Dr [name], we will decrease her Cardizem CD to 120 mg po daily, and she should continue to monitor and make sure she is staying very hydrated.  Pt request that only a month supply with one refill be send into her confirmed pharmacy of choice, to make sure she tolerates this dose appropriately. Pt states she will continue to monitor and call us as needed.  Pt verbalized understanding and agrees with this plan.

## 2019-05-07 NOTE — Telephone Encounter (Signed)
° ° °  Pt c/o medication issue:  1. Name of Medication: diltiazem (CARDIZEM CD) 240 MG 24 hr capsule  2. How are you currently taking this medication (dosage and times per day)? 1x daily in the morning   3. Are you having a reaction (difficulty breathing--STAT)? no  4. What is your medication issue? Patient states her BP goes very low at the end of the day when she takes the diltiazem. She also gets fatigued without even doing much after lunch.  She wants to know if the dosage could be reduced, or even if the medication could be eliminated.  She is taking all of her other medication as directed.

## 2019-05-07 NOTE — Telephone Encounter (Signed)
[name], pt states that her diltiazem 240 mg po daily she is taking is causing her BP to drop in the evening time and is making her feel very fatigued and weak.  She would like the dose reduced or switched to a different regimen. She is taking all her other meds with no issues at all. Please advise.

## 2019-09-05 ENCOUNTER — Other Ambulatory Visit: Payer: Self-pay | Admitting: Cardiology

## 2019-09-25 ENCOUNTER — Encounter: Payer: Self-pay | Admitting: Cardiology

## 2019-11-05 NOTE — Progress Notes (Addendum)
Cardiology Office Note    Date:  11/06/2019   ID:  Sara Griffin, Alferd Apa [birth year], MRN [id number]  PCP:  Algis Greenhouse, MD  Cardiologist: Ena Dawley, MD EPS: None  Chief Complaint  Patient presents with  . Follow-up    History of Present Illness:  Sara Griffin is a 86 y.o. female with history of Chronic Atrial fibrillation chadsvasc=4 on xarelto, HTN, HLD, carotid artery stenosis, mod MR/TR, systolic CHF, CKD stage 3.Echo 11/10/17 showed mild LVH, EF 40-45%, dyskinesis of anteroseptal myocardium, moderate mitral regurgitation, severely dilated LA/RA, moderate TR.nuclear stress test 02/2016 which was negative for ischemia.   Last saw [name] 04/17/19 and was doing well.She had some LE edema and was given lasix 2 mg for 3 days then prn.  Patient comes in for 6 month f/u. BP high today but she says it's because of rushing to get here. She's only taking losartan if BP over 140. Has occasional sharp shooting pain at rest but goes await instantly. Occasional chest heaviness during the day when she is at rest and when vacuuming. About a month ago she had chest tightness at rest that lasted 5 min. No radiation but some shortness of breath. No palpitations. Legs swell after being on her feet.  Past Medical History:  Diagnosis Date  . Cardiomyopathy ([place])    Echo 1/19: mild LVH, EF 40-45, ant-sept DK, mild AI, MAC, mod MR, severe BAE, mod TR  . Chronic atrial fibrillation ([place])   . Chronic systolic CHF (congestive heart failure) ([place])    a. dx 10/2017 but sx dating back to 08/2017.  . CKD (chronic kidney disease), stage III    a. ? CKD - baseline Cr 0.8-1.1.  . Hx of colonic polyps   . Hyperlipidemia   . Hypertension   . Mild carotid artery disease ([place])    a. Duplex 2014 mild carotid plaque 0-39% BICA.  Marland Kitchen Moderate mitral regurgitation 10/2017  . Moderate tricuspid regurgitation 10/2017  . PVC's (premature ventricular contractions)   . Uterine cancer [facility])     Past  Surgical History:  Procedure Laterality Date  . ABDOMINAL HYSTERECTOMY    . PARTIAL COLECTOMY      Current Medications: Current Meds  Medication Sig  . Cholecalciferol (VITAMIN D3) 125 MCG (5000 UT) TABS Take by mouth.  . diltiazem (CARDIZEM CD) 120 MG 24 hr capsule TAKE 1 CAPSULE(120 MG) BY MOUTH DAILY  . furosemide (LASIX) 40 MG tablet Take 40 mg by mouth daily as needed for fluid.   Marland Kitchen losartan (COZAAR) 25 MG tablet TAKE 1 TABLET(25 MG) BY MOUTH DAILY  . XARELTO 15 MG TABS tablet Take 1 tablet by mouth daily with supper.      Allergies:   Hydrocod polst-cpm polst er   Social History   Socioeconomic History  . Marital status: Married    Spouse name: Not on file  . Number of children: 5  . Years of education: Not on file  . Highest education level: Not on file  Occupational History  . Not on file  Tobacco Use  . Smoking status: Never Smoker  . Smokeless tobacco: Never Used  Substance and Sexual Activity  . Alcohol use: No  . Drug use: No  . Sexual activity: Not on file  Other Topics Concern  . Not on file  Social History Narrative  . Not on file   Social Determinants of Health   Financial Resource Strain:   . Difficulty of Paying Living Expenses:  Not on file  Food Insecurity:   . Worried About Charity fundraiser in the Last Year: Not on file  . Ran Out of Food in the Last Year: Not on file  Transportation Needs:   . Lack of Transportation (Medical): Not on file  . Lack of Transportation (Non-Medical): Not on file  Physical Activity:   . Days of Exercise per Week: Not on file  . Minutes of Exercise per Session: Not on file  Stress:   . Feeling of Stress : Not on file  Social Connections:   . Frequency of Communication with Friends and Family: Not on file  . Frequency of Social Gatherings with Friends and Family: Not on file  . Attends Religious Services: Not on file  . Active Member of Clubs or Organizations: Not on file  . Attends Archivist  Meetings: Not on file  . Marital Status: Not on file     Family History:  The patient's   family history includes Heart attack (age of onset: 43) in her son; Heart attack (age of onset: 23) in her father; Heart disease in an other family member.   ROS:   Please see the history of present illness.    ROS All other systems reviewed and are negative.   PHYSICAL EXAM:   VS:  BP (!) 158/94   Pulse 79   Ht _0  (1.575 m)   Wt 128 lb (58.1 kg)   SpO2 97%   BMI 23.41 kg/m   Physical Exam  GEN: Well nourished, well developed, in no acute distress  Neck: no JVD, carotid bruits, or masses Cardiac:RRR; S4no murmurs, rubs,   Respiratory:  clear to auscultation bilaterally, normal work of breathing GI: soft, nontender, nondistended, + BS Ext: trace right ankle edema, varicosities, Good distal pulses bilaterally Neuro:  Alert and Oriented x 3 Psych: euthymic mood, full affect  Wt Readings from Last 3 Encounters:  11/06/19 128 lb (58.1 kg)  04/17/19 128 lb (58.1 kg)  12/24/18 125 lb 3.2 oz (56.8 kg)      Studies/Labs Reviewed:   EKG:  EKG is not ordered today.    Recent Labs: 04/15/2019: ALT 21; BUN 28; Creatinine, Ser 1.23; Hemoglobin 15.7; Platelets 165; Potassium 4.2; Sodium 139; TSH 5.440   Lipid Panel    Component Value Date/Time   CHOL 219 (H) 04/15/2019 0737   TRIG 103 04/15/2019 0737   HDL 83 04/15/2019 0737   CHOLHDL 2.6 04/15/2019 0737   LDLCALC 115 (H) 04/15/2019 0737    Additional studies/ records that were reviewed today include:    Echo 10/2017 Study Conclusions   - Left ventricle: The cavity size was normal. Wall thickness was   increased in a pattern of mild LVH. Systolic function was mildly   to moderately reduced. The estimated ejection fraction was in the   range of 40% to 45%. There is dyskinesis of the anteroseptal   myocardium. - Aortic valve: There was mild regurgitation. - Mitral valve: Calcified annulus. There was moderate   regurgitation. -  Left atrium: The atrium was severely dilated. - Right atrium: The atrium was severely dilated. - Tricuspid valve: There was moderate regurgitation.   Impressions:   - Dyskinesis of the septum with overall mild to moderate LV   dysfunction (EF 40); mild LVH; AI; moderate MR; severe biatrial   enlargement; moderate TR.  NST 2017 Study Highlights   Nuclear stress EF: 57%.  Defect 1: There is a medium defect of  mild severity present in the mid inferoseptal location.   Low risk stress nuclear study with inferoseptal thinning; no ischemia; EF 57 with normal wall motion.       Carotids 2014 0-39% bilateral stenosis  ASSESSMENT:    1. Precordial pain   2. Longstanding persistent atrial fibrillation ([place])   3. Essential hypertension   4. Chronic systolic CHF (congestive heart failure) ([place])   5. Hyperlipidemia, unspecified hyperlipidemia type   6. Other chest pain      PLAN:  In order of problems listed above:  Chest pain described as a tightness at rest lasting 5-minute patient has had a couple episodes since her last visit.  Also occurs with vacuuming.  Had some sharp shooting chest pains unrelated.  Lexiscan Myoview.  Follow-up with me after  Chronic Atrial fibrillation on Xarelto check surveillance labs today.  HTN blood pressure quite high today.  Patient is only taking her losartan for blood pressure is over 140 and she does not check her blood pressure regularly of asked her to restart losartan daily.  bmet today.  She had creatinine of 1.23 in June while on losartan.  Repeat be met couple weeks.  Systolic CHF EF 33-82% echo 11/10/17 with mod MR/TR  HLD LDL 115 03/2019  Carotid stenosis 0-39% 2014    Medication Adjustments/Labs and Tests Ordered: Current medicines are reviewed at length with the patient today.  Concerns regarding medicines are outlined above.  Medication changes, Labs and Tests ordered today are listed in the Patient Instructions below. Patient  Instructions  Medication Instructions:  TAKE LOSARATAN EVERY DAY  *If you need a refill on your cardiac medications before your next appointment, please call your pharmacy*  Lab Work: BMET  AND CBC TODAY AND REPEAT BMET IN ABOUT 1 MONTH  Your physician has requested that you have a lexiscan myoview. For further information please visit [email or web address]. Please follow instruction sheet, as given. IN ABOUT A MONTH Your physician recommends that you schedule a follow-up appointment in: VIRTUAL VISIT WITH  IN ABOUT 5 WEEKS AND 6 MONTHS WITH DR Kate Sable, Ermalinda Barrios, PA-C  11/06/2019 9:58 AM    [address], [place], [place]  [address] Phone: [phone]; Fax: [phone]    Addendum: 12/17/19 Covering for Ermalinda Barrios, PA  Noted that the patient has declined scheduling of her Myoview.   Burtis Junes, RN, [place] [address] [place], [place]  [address] [phone]

## 2019-11-06 ENCOUNTER — Ambulatory Visit (INDEPENDENT_AMBULATORY_CARE_PROVIDER_SITE_OTHER): Payer: Medicare Other | Admitting: Physician Assistant

## 2019-11-06 ENCOUNTER — Encounter: Payer: Self-pay | Admitting: *Deleted

## 2019-11-06 ENCOUNTER — Encounter (INDEPENDENT_AMBULATORY_CARE_PROVIDER_SITE_OTHER): Payer: Self-pay

## 2019-11-06 ENCOUNTER — Other Ambulatory Visit: Payer: Self-pay

## 2019-11-06 ENCOUNTER — Encounter: Payer: Self-pay | Admitting: Physician Assistant

## 2019-11-06 VITALS — BP 158/94 | HR 79 | Ht 62.0 in | Wt 128.0 lb

## 2019-11-06 DIAGNOSIS — R072 Precordial pain: Secondary | ICD-10-CM

## 2019-11-06 DIAGNOSIS — E785 Hyperlipidemia, unspecified: Secondary | ICD-10-CM

## 2019-11-06 DIAGNOSIS — I10 Essential (primary) hypertension: Secondary | ICD-10-CM | POA: Diagnosis not present

## 2019-11-06 DIAGNOSIS — R0789 Other chest pain: Secondary | ICD-10-CM

## 2019-11-06 DIAGNOSIS — I5022 Chronic systolic (congestive) heart failure: Secondary | ICD-10-CM

## 2019-11-06 DIAGNOSIS — I4811 Longstanding persistent atrial fibrillation: Secondary | ICD-10-CM

## 2019-11-06 LAB — CBC WITH DIFFERENTIAL/PLATELET
Basophils Absolute: 0 10*3/uL (ref 0.0–0.2)
Basos: 0 %
EOS (ABSOLUTE): 0.1 10*3/uL (ref 0.0–0.4)
Eos: 2 %
Hematocrit: 46.9 % — ABNORMAL HIGH (ref 34.0–46.6)
Hemoglobin: 15.9 g/dL (ref 11.1–15.9)
Immature Grans (Abs): 0 10*3/uL (ref 0.0–0.1)
Immature Granulocytes: 0 %
Lymphocytes Absolute: 1.5 10*3/uL (ref 0.7–3.1)
Lymphs: 24 %
MCH: 33.2 pg — ABNORMAL HIGH (ref 26.6–33.0)
MCHC: 33.9 g/dL (ref 31.5–35.7)
MCV: 98 fL — ABNORMAL HIGH (ref 79–97)
Monocytes Absolute: 0.6 10*3/uL (ref 0.1–0.9)
Monocytes: 9 %
Neutrophils Absolute: 4.1 10*3/uL (ref 1.4–7.0)
Neutrophils: 65 %
Platelets: 156 10*3/uL (ref 150–450)
RBC: 4.79 x10E6/uL (ref 3.77–5.28)
RDW: 12.4 % (ref 11.7–15.4)
WBC: 6.2 10*3/uL (ref 3.4–10.8)

## 2019-11-06 LAB — BASIC METABOLIC PANEL
BUN/Creatinine Ratio: 23 (ref 12–28)
BUN: 28 mg/dL — ABNORMAL HIGH (ref 8–27)
CO2: 22 mmol/L (ref 20–29)
Calcium: 9.5 mg/dL (ref 8.7–10.3)
Chloride: 100 mmol/L (ref 96–106)
Creatinine, Ser: 1.24 mg/dL — ABNORMAL HIGH (ref 0.57–1.00)
GFR calc Af Amer: 45 mL/min/{1.73_m2} — ABNORMAL LOW (ref >59)
GFR calc non Af Amer: 39 mL/min/{1.73_m2} — ABNORMAL LOW (ref >59)
Glucose: 88 mg/dL (ref 65–99)
Potassium: 4.6 mmol/L (ref 3.5–5.2)
Sodium: 140 mmol/L (ref 134–144)

## 2019-11-06 NOTE — Patient Instructions (Addendum)
Medication Instructions:  TAKE LOSARATAN EVERY DAY  *If you need a refill on your cardiac medications before your next appointment, please call your pharmacy*  Lab Work: BMET  AND CBC TODAY AND REPEAT BMET IN ABOUT 1 MONTH  Your physician has requested that you have a lexiscan myoview. For further information please visit [email or web address]. Please follow instruction sheet, as given. IN ABOUT A MONTH Your physician recommends that you schedule a follow-up appointment in: VIRTUAL VISIT WITH MICHELE IN ABOUT 5 WEEKS AND 6 MONTHS WITH DR [name]

## 2019-11-11 ENCOUNTER — Ambulatory Visit: Payer: Medicare Other | Admitting: Cardiology

## 2019-11-18 ENCOUNTER — Encounter (HOSPITAL_COMMUNITY): Payer: Medicare Other

## 2019-12-09 ENCOUNTER — Other Ambulatory Visit: Payer: Medicare Other

## 2019-12-09 ENCOUNTER — Encounter (HOSPITAL_COMMUNITY): Payer: Medicare Other

## 2019-12-11 ENCOUNTER — Telehealth: Payer: Medicare Other | Admitting: Physician Assistant

## 2020-03-09 ENCOUNTER — Other Ambulatory Visit: Payer: Self-pay

## 2020-03-09 MED ORDER — DILTIAZEM HCL ER COATED BEADS 120 MG PO CP24: ORAL_CAPSULE | ORAL | 1 refills | Status: DC

## 2020-03-09 NOTE — Telephone Encounter (Signed)
Pt's medication was sent to pt's pharmacy as requested. Confirmation received.  °

## 2020-03-18 ENCOUNTER — Telehealth: Payer: Self-pay | Admitting: Physician Assistant

## 2020-03-18 DIAGNOSIS — R5383 Other fatigue: Secondary | ICD-10-CM

## 2020-03-18 DIAGNOSIS — R0609 Other forms of dyspnea: Secondary | ICD-10-CM

## 2020-03-18 NOTE — Telephone Encounter (Signed)
Called and spoke to the patient. She states that she never had her stress test done. She states that she has been feeling tired and SOB on exertion. She denies chest pain, swelling, or weight gain. She states that her thyroid has been out of whack. She states that her BP has been better with SBP in the 120s. Discussed with Selinda Eon and we will reorder stress test and schedule a follow up appointment after the stress test. Appointment with Selinda Eon scheduled for 04/08/20. Made patient aware that she will be contacted by [facility] to schedule stress test. She verbalized understanding and thanked me for the call.

## 2020-03-18 NOTE — Telephone Encounter (Signed)
New Message:      Pt said she saw Sara Griffin in January. She said she would like to talk to [facility] please. She said she had somethings she need to discuss with her.She would not say what it was about.

## 2020-03-25 ENCOUNTER — Telehealth (HOSPITAL_COMMUNITY): Payer: Self-pay | Admitting: *Deleted

## 2020-03-25 NOTE — Telephone Encounter (Signed)
Patient given detailed instructions per Myocardial Perfusion Study Information Sheet for the test on 04/01/20 °. Patient notified to arrive 15 minutes early and that it is imperative to arrive on time for appointment to keep from having the test rescheduled. ° If you need to cancel or reschedule your appointment, please call the office within 24 hours of your appointment. . Patient verbalized understanding. Sara Griffin [name] ° ° ° °

## 2020-04-01 ENCOUNTER — Other Ambulatory Visit: Payer: Self-pay

## 2020-04-01 ENCOUNTER — Ambulatory Visit (HOSPITAL_COMMUNITY): Payer: Medicare Other | Attending: Physician Assistant

## 2020-04-01 DIAGNOSIS — R5383 Other fatigue: Secondary | ICD-10-CM | POA: Insufficient documentation

## 2020-04-01 DIAGNOSIS — R0600 Dyspnea, unspecified: Secondary | ICD-10-CM | POA: Insufficient documentation

## 2020-04-01 DIAGNOSIS — R0609 Other forms of dyspnea: Secondary | ICD-10-CM

## 2020-04-01 LAB — MYOCARDIAL PERFUSION IMAGING
LV dias vol: 42 mL (ref 46–106)
LV sys vol: 16 mL
Peak HR: 102 {beats}/min
Rest HR: 91 {beats}/min
SDS: 3
SRS: 2
SSS: 5
TID: 0.89

## 2020-04-01 MED ORDER — TECHNETIUM TC 99M TETROFOSMIN IV KIT
10.5000 | PACK | Freq: Once | INTRAVENOUS | Status: AC | PRN
  Administered 2020-04-01: 10.5 via INTRAVENOUS
  Filled 2020-04-01: qty 11

## 2020-04-01 MED ORDER — TECHNETIUM TC 99M TETROFOSMIN IV KIT
30.4000 | PACK | Freq: Once | INTRAVENOUS | Status: AC | PRN
  Administered 2020-04-01: 30.4 via INTRAVENOUS
  Filled 2020-04-01: qty 31

## 2020-04-01 MED ORDER — REGADENOSON 0.4 MG/5ML IV SOLN
0.4000 mg | Freq: Once | INTRAVENOUS | Status: AC
  Administered 2020-04-01: 0.4 mg via INTRAVENOUS

## 2020-04-07 NOTE — Progress Notes (Signed)
Cardiology Office Note    Date:  04/08/2020   ID:  Sara Griffin, Sara Griffin 02-Oct-1930, [name]  PCP:  Algis Greenhouse, MD  Cardiologist: Ena Dawley, MD EPS: None  Chief Complaint  Patient presents with  . Follow-up    History of Present Illness:  Sara Griffin is a 86 y.o. female with history of Chronic Atrial fibrillation chadsvasc=4 on xarelto, HTN, HLD, carotid artery stenosis, mod MR/TR, systolic CHF, CKD stage 3.Echo 11/10/17 showed mild LVH, EF 40-45%, dyskinesis of anteroseptal myocardium, moderate mitral regurgitation, severely dilated LA/RA, moderate TR.nuclear stress test 02/2016 which was negative for ischemia.   Last saw [name] 04/17/19 and was doing well.She had some LE edema and was given lasix 20 mg for 3 days then prn.  Patient 11/06/2019 complaining of occasional chest heaviness with vacuuming and some leg swelling.  Having some sharp shooting chest pains.  Blood pressure was also elevated and she only takes losartan if her blood pressures over 140.  I ordered a [employer] but it was never done.  She called in earlier this month complaining of increased dyspnea on exertion and fatigue.  Lexiscan was performed and was normal.  Patient comes in with her son. Says she has good and bad days. Some days she feels very tired and fatigued. BP fluctuates up/down. 106/64-151/88. She's very active, walking, cleaning, still drives.  Patient said she had a little rash on her chest before the [place] did not PCP told her it was eczema.  After the Lexi scan she developed significant hives spreading down her legs back and upper arms.  She was given steroids for 3 days but still has a rash that looks like hives all over her body but is not itching.  She is seeing PCP and dermatologist next week.   Past Medical History:  Diagnosis Date  . Cardiomyopathy ([place])    Echo 1/19: mild LVH, EF 40-45, ant-sept DK, mild AI, MAC, mod MR, severe BAE, mod TR  . Chronic atrial  fibrillation ([place])   . Chronic systolic CHF (congestive heart failure) ([place])    a. dx 10/2017 but sx dating back to 08/2017.  . CKD (chronic kidney disease), stage III    a. ? CKD - baseline Cr 0.8-1.1.  . Hx of colonic polyps   . Hyperlipidemia   . Hypertension   . Mild carotid artery disease ([place])    a. Duplex 2014 mild carotid plaque 0-39% BICA.  Marland Kitchen Moderate mitral regurgitation 10/2017  . Moderate tricuspid regurgitation 10/2017  . PVC's (premature ventricular contractions)   . Uterine cancer [facility])     Past Surgical History:  Procedure Laterality Date  . ABDOMINAL HYSTERECTOMY    . PARTIAL COLECTOMY      Current Medications: Current Meds  Medication Sig  . Cholecalciferol (VITAMIN D3) 125 MCG (5000 UT) TABS Take by mouth.  . diltiazem (CARDIZEM CD) 120 MG 24 hr capsule TAKE 1 CAPSULE(120 MG) BY MOUTH DAILY  . furosemide (LASIX) 40 MG tablet Take 40 mg by mouth daily as needed for fluid.   Marland Kitchen levothyroxine (SYNTHROID) 25 MCG tablet Take 25 mcg by mouth daily.  Marland Kitchen losartan (COZAAR) 25 MG tablet Take 0.5 tablets (12.5 mg total) by mouth daily.  . mometasone (ELOCON) 0.1 % cream Apply 1 application topically daily as needed.  Alveda Reasons 15 MG TABS tablet Take 1 tablet by mouth daily with supper.   . [DISCONTINUED] losartan (COZAAR) 25 MG tablet TAKE 1 TABLET(25 MG) BY  MOUTH DAILY     Allergies:   Hydrocod polst-cpm polst er   Social History   Socioeconomic History  . Marital status: Married    Spouse name: Not on file  . Number of children: 5  . Years of education: Not on file  . Highest education level: Not on file  Occupational History  . Not on file  Tobacco Use  . Smoking status: Never Smoker  . Smokeless tobacco: Never Used  Vaping Use  . Vaping Use: Never used  Substance and Sexual Activity  . Alcohol use: No  . Drug use: No  . Sexual activity: Not on file  Other Topics Concern  . Not on file  Social History Narrative  . Not on file   Social Determinants  of Health   Financial Resource Strain:   . Difficulty of Paying Living Expenses:   Food Insecurity:   . Worried About Charity fundraiser in the Last Year:   . Arboriculturist in the Last Year:   Transportation Needs:   . Film/video editor (Medical):   Marland Kitchen Lack of Transportation (Non-Medical):   Physical Activity:   . Days of Exercise per Week:   . Minutes of Exercise per Session:   Stress:   . Feeling of Stress :   Social Connections:   . Frequency of Communication with Friends and Family:   . Frequency of Social Gatherings with Friends and Family:   . Attends Religious Services:   . Active Member of Clubs or Organizations:   . Attends Archivist Meetings:   Marland Kitchen Marital Status:      Family History:  The patient's family history includes Heart attack (age of onset: 51) in her son; Heart attack (age of onset: 83) in her father; Heart disease in an other family member.   ROS:   Please see the history of present illness.    ROS All other systems reviewed and are negative.   PHYSICAL EXAM:   VS:  BP (!) 162/92   Pulse 74   Ht _0  (1.575 m)   Wt 124 lb 12.8 oz (56.6 kg)   SpO2 96%   BMI 22.83 kg/m   Physical Exam  GEN: Well nourished, well developed, in no acute distress  Neck: no JVD, carotid bruits, or masses Cardiac:RRR; no murmurs, rubs, or gallops  Respiratory:  clear to auscultation bilaterally, normal work of breathing GI: soft, nontender, nondistended, + BS Ext: without cyanosis, clubbing, or edema, Good distal pulses bilaterally Neuro:  Alert and Oriented x 3 Psych: euthymic mood, full affect  Wt Readings from Last 3 Encounters:  04/08/20 124 lb 12.8 oz (56.6 kg)  04/01/20 128 lb (58.1 kg)  11/06/19 128 lb (58.1 kg)      Studies/Labs Reviewed:   EKG:  EKG is Afib at 89/m poor R wave progression ant, unchanged    Recent Labs: 04/15/2019: ALT 21; TSH 5.440 11/06/2019: BUN 28; Creatinine, Ser 1.24; Hemoglobin 15.9; Platelets 156; Potassium  4.6; Sodium 140   Lipid Panel    Component Value Date/Time   CHOL 219 (H) 04/15/2019 0737   TRIG 103 04/15/2019 0737   HDL 83 04/15/2019 0737   CHOLHDL 2.6 04/15/2019 0737   LDLCALC 115 (H) 04/15/2019 0737    Additional studies/ records that were reviewed today include:  NST 6/9/2021Study Highlights   Nuclear stress EF: 61%.  There was no ST segment deviation noted during stress.  The left ventricular ejection fraction is normal (  55-65%).  The study is normal.  This is a low risk study.  No ischemia.    2D echo 2019 Study Conclusions   - Left ventricle: The cavity size was normal. Wall thickness was    increased in a pattern of mild LVH. Systolic function was mildly    to moderately reduced. The estimated ejection fraction was in the    range of 40% to 45%. There is dyskinesis of the anteroseptal    myocardium.  - Aortic valve: There was mild regurgitation.  - Mitral valve: Calcified annulus. There was moderate    regurgitation.  - Left atrium: The atrium was severely dilated.  - Right atrium: The atrium was severely dilated.  - Tricuspid valve: There was moderate regurgitation.   Impressions:   - Dyskinesis of the septum with overall mild to moderate LV    dysfunction (EF 40); mild LVH; AI; moderate MR; severe biatrial    enlargement; moderate TR.   -------------------------------------------------------------------  Study data:  Comparison was made to the study of December 2017.  Study status:  Routine.  Procedure:  Transthoracic  echocardiography. Image quality was adequate.  Study completion:  There were no complications.          Transthoracic  echocardiography.  M-mode, complete 2D, spectral Doppler, and color  Doppler.  Birthdate:  Patient birthdate: 11-Sep-1930.  Age:  Patient  is 86 yr old.  Sex:  Gender: female.    BMI: 22.7 kg/m^2.  Blood  pressure:     142/80  Patient status:  Outpatient.  Study date:  Study date: 11/10/2017. Study time: 09:00 AM.   Location:  [facility]     ASSESSMENT:    1. DOE (dyspnea on exertion)   2. Permanent atrial fibrillation ([place])   3. Essential hypertension   4. Chronic systolic CHF (congestive heart failure) ([place])   5. Hyperlipidemia, unspecified hyperlipidemia type   6. Stenosis of carotid artery, unspecified laterality      PLAN:  In order of problems listed above:  Dyspnea on exertion Lexiscan low risk no ischemia EF 55 to 65%   Chronic Atrial fibrillation on Xarelto and diltiazem rate controlled  HTN blood pressure fluctuates and she takes losartan as needed.  I have asked her to take losartan 25 mg 1/2 tablet daily.  Check her blood pressure 2 hours after she takes the medications and let us know.  Systolic CHF EF 03-70% echo 11/10/17 with mod MR/TR no symptoms.  Carotid stenosis 0-39% 2014  Rash patient had rash on her chest prior to Lexi scan and was told it was eczema.  Immediately after Lexi scan she developed hives all over her body treated with steroids for 3 days by PCP.  She continues to have hives but not itching anymore.  She will see her PCP and dermatologist next week.  Not sure this is related to [facility] but will document and allergies in case she needs another one.  Lexiscan done in 2017 and she had no problems.   Medication Adjustments/Labs and Tests Ordered: Current medicines are reviewed at length with the patient today.  Concerns regarding medicines are outlined above.  Medication changes, Labs and Tests ordered today are listed in the Patient Instructions below. Patient Instructions  Medication Instructions:  Your physician has recommended you make the following change in your medication:   DECREASE: losartan 25 mg tablet: Take 1/2 tablet (12.5 mg) by mouth once a day  *If you need a refill on your cardiac  medications before your next appointment, please call your pharmacy*   Lab Work: None  If you have labs (blood work) drawn today and your tests are  completely normal, you will receive your results only by: Marland Kitchen MyChart Message (if you have MyChart) OR . A paper copy in the mail If you have any lab test that is abnormal or we need to change your treatment, we will call you to review the results.   Testing/Procedures: None  Follow-Up: Follow up with [name] on 08/21/20 at 3:00 PM  Other Instructions None     Signed, Ermalinda Barrios, PA-C  04/08/2020 2:50 PM    [address], [employer] [address], [place]  [address] Phone: [phone]; Fax: [phone]

## 2020-04-08 ENCOUNTER — Ambulatory Visit (INDEPENDENT_AMBULATORY_CARE_PROVIDER_SITE_OTHER): Payer: Medicare Other | Admitting: Physician Assistant

## 2020-04-08 ENCOUNTER — Other Ambulatory Visit: Payer: Self-pay

## 2020-04-08 ENCOUNTER — Encounter: Payer: Self-pay | Admitting: Physician Assistant

## 2020-04-08 VITALS — BP 162/92 | HR 74 | Ht 62.0 in | Wt 124.8 lb

## 2020-04-08 DIAGNOSIS — R0600 Dyspnea, unspecified: Secondary | ICD-10-CM

## 2020-04-08 DIAGNOSIS — R0609 Other forms of dyspnea: Secondary | ICD-10-CM

## 2020-04-08 DIAGNOSIS — I4821 Permanent atrial fibrillation: Secondary | ICD-10-CM

## 2020-04-08 DIAGNOSIS — I10 Essential (primary) hypertension: Secondary | ICD-10-CM

## 2020-04-08 DIAGNOSIS — I5022 Chronic systolic (congestive) heart failure: Secondary | ICD-10-CM | POA: Diagnosis not present

## 2020-04-08 DIAGNOSIS — E785 Hyperlipidemia, unspecified: Secondary | ICD-10-CM

## 2020-04-08 DIAGNOSIS — I6529 Occlusion and stenosis of unspecified carotid artery: Secondary | ICD-10-CM | POA: Diagnosis not present

## 2020-04-08 MED ORDER — LOSARTAN POTASSIUM 25 MG PO TABS: 12.5000 mg | ORAL_TABLET | Freq: Every day | ORAL | 3 refills | Status: DC

## 2020-04-08 NOTE — Patient Instructions (Signed)
Medication Instructions:  Your physician has recommended you make the following change in your medication:   DECREASE: losartan 25 mg tablet: Take 1/2 tablet (12.5 mg) by mouth once a day  *If you need a refill on your cardiac medications before your next appointment, please call your pharmacy*   Lab Work: None  If you have labs (blood work) drawn today and your tests are completely normal, you will receive your results only by: Marland Kitchen MyChart Message (if you have MyChart) OR . A paper copy in the mail If you have any lab test that is abnormal or we need to change your treatment, we will call you to review the results.   Testing/Procedures: None  Follow-Up: Follow up with [name] on 08/21/20 at 3:00 PM  Other Instructions None

## 2020-08-04 ENCOUNTER — Other Ambulatory Visit: Payer: Self-pay | Admitting: Urology

## 2020-08-05 NOTE — Patient Instructions (Addendum)
DUE TO COVID-19 ONLY ONE VISITOR IS ALLOWED TO COME WITH YOU AND STAY IN THE WAITING ROOM ONLY DURING PRE OP AND PROCEDURE DAY OF SURGERY. THE 1 VISITOR  MAY VISIT WITH YOU AFTER SURGERY IN YOUR PRIVATE ROOM DURING VISITING HOURS ONLY!  YOU NEED TO HAVE A COVID 19 TEST ON Monday August 10, 2020 @ 1155 am, THIS TEST MUST BE DONE BEFORE SURGERY,  COVID TESTING SITE [address] [place] [address], IT IS ON THE RIGHT GOING OUT [address] APPROXIMATELY  2 MINUTES PAST [employer] ON THE RIGHT. ONCE YOUR COVID TEST IS COMPLETED,  PLEASE BEGIN THE QUARANTINE INSTRUCTIONS AS OUTLINED IN YOUR HANDOUT.                [place]  08/05/2020   Your procedure is scheduled on: Thursday August 13, 2020   Report to [facility] Main  Entrance   Report to admitting at 0900 AM     Call this number if you have problems the morning of surgery [phone]    Remember: Do not eat food or drink liquids :After Midnight. BRUSH YOUR TEETH MORNING OF SURGERY AND RINSE YOUR MOUTH OUT, NO CHEWING GUM CANDY OR MINTS.     Take these medicines the morning of surgery with A SIP OF WATER: Levothyroxine                                 You may not have any metal on your body including hair pins and              piercings  Do not wear jewelry, make-up, lotions, powders or perfumes, deodorant             Do not wear nail polish on your fingernails.  Do not shave  48 hours prior to surgery.            Do not bring valuables to the hospital. [place].  Contacts, dentures or bridgework may not be worn into surgery.     Patients discharged the day of surgery will not be allowed to drive home. IF YOU ARE HAVING SURGERY AND GOING HOME THE SAME DAY, YOU MUST HAVE AN ADULT TO DRIVE YOU HOME AND BE WITH YOU FOR 24 HOURS. YOU MAY GO HOME BY TAXI OR UBER OR ORTHERWISE, BUT AN ADULT MUST ACCOMPANY YOU HOME AND STAY WITH YOU FOR 24  HOURS.  _____________________________________________________________________             [facility] - Preparing for Surgery Before surgery, you can play an important role.  Because skin is not sterile, your skin needs to be as free of germs as possible.  You can reduce the number of germs on your skin by washing with CHG (chlorahexidine gluconate) soap before surgery.  CHG is an antiseptic cleaner which kills germs and bonds with the skin to continue killing germs even after washing. Please DO NOT use if you have an allergy to CHG or antibacterial soaps.  If your skin becomes reddened/irritated stop using the CHG and inform your nurse when you arrive at Short Stay. Do not shave (including legs and underarms) for at least 48 hours prior to the first CHG shower.  You may shave your face/neck. Please follow these instructions carefully:  1.  Shower with CHG Soap the night before surgery and the  morning of Surgery.  2.  If you choose to wash your hair, wash your hair first as usual with your  normal  shampoo.  3.  After you shampoo, rinse your hair and body thoroughly to remove the  shampoo.                           4.  Use CHG as you would any other liquid soap.  You can apply chg directly  to the skin and wash                       Gently with a scrungie or clean washcloth.  5.  Apply the CHG Soap to your body ONLY FROM THE NECK DOWN.   Do not use on face/ open                           Wound or open sores. Avoid contact with eyes, ears mouth and genitals (private parts).                       Wash face,  Genitals (private parts) with your normal soap.             6.  Wash thoroughly, paying special attention to the area where your surgery  will be performed.  7.  Thoroughly rinse your body with warm water from the neck down.  8.  DO NOT shower/wash with your normal soap after using and rinsing off  the CHG Soap.                9.  Pat yourself dry with a clean towel.            10.  Wear  clean pajamas.            11.  Place clean sheets on your bed the night of your first shower and do not  sleep with pets. Day of Surgery : Do not apply any lotions/deodorants the morning of surgery.  Please wear clean clothes to the hospital/surgery center.  FAILURE TO FOLLOW THESE INSTRUCTIONS MAY RESULT IN THE CANCELLATION OF YOUR SURGERY PATIENT SIGNATURE_________________________________  NURSE SIGNATURE__________________________________  ________________________________________________________________________

## 2020-08-06 ENCOUNTER — Other Ambulatory Visit: Payer: Self-pay

## 2020-08-06 ENCOUNTER — Encounter (HOSPITAL_COMMUNITY)
Admission: RE | Admit: 2020-08-06 | Discharge: 2020-08-06 | Disposition: A | Discharge status: Home / Self Care | Payer: Medicare Other | Source: Ambulatory Visit | Attending: Urology | Admitting: Urology

## 2020-08-06 ENCOUNTER — Encounter (HOSPITAL_COMMUNITY): Payer: Self-pay

## 2020-08-06 DIAGNOSIS — E785 Hyperlipidemia, unspecified: Secondary | ICD-10-CM | POA: Insufficient documentation

## 2020-08-06 DIAGNOSIS — I779 Disorder of arteries and arterioles, unspecified: Secondary | ICD-10-CM | POA: Diagnosis not present

## 2020-08-06 DIAGNOSIS — N183 Chronic kidney disease, stage 3 unspecified: Secondary | ICD-10-CM | POA: Diagnosis not present

## 2020-08-06 DIAGNOSIS — Z01812 Encounter for preprocedural laboratory examination: Secondary | ICD-10-CM | POA: Insufficient documentation

## 2020-08-06 DIAGNOSIS — C662 Malignant neoplasm of left ureter: Secondary | ICD-10-CM | POA: Insufficient documentation

## 2020-08-06 DIAGNOSIS — I081 Rheumatic disorders of both mitral and tricuspid valves: Secondary | ICD-10-CM | POA: Diagnosis not present

## 2020-08-06 DIAGNOSIS — Z79899 Other long term (current) drug therapy: Secondary | ICD-10-CM | POA: Diagnosis not present

## 2020-08-06 DIAGNOSIS — Z7901 Long term (current) use of anticoagulants: Secondary | ICD-10-CM | POA: Insufficient documentation

## 2020-08-06 DIAGNOSIS — I429 Cardiomyopathy, unspecified: Secondary | ICD-10-CM | POA: Diagnosis not present

## 2020-08-06 DIAGNOSIS — I4891 Unspecified atrial fibrillation: Secondary | ICD-10-CM | POA: Insufficient documentation

## 2020-08-06 DIAGNOSIS — I5022 Chronic systolic (congestive) heart failure: Secondary | ICD-10-CM | POA: Diagnosis not present

## 2020-08-06 DIAGNOSIS — I493 Ventricular premature depolarization: Secondary | ICD-10-CM | POA: Diagnosis not present

## 2020-08-06 DIAGNOSIS — I130 Hypertensive heart and chronic kidney disease with heart failure and stage 1 through stage 4 chronic kidney disease, or unspecified chronic kidney disease: Secondary | ICD-10-CM | POA: Diagnosis not present

## 2020-08-06 DIAGNOSIS — Z79890 Hormone replacement therapy (postmenopausal): Secondary | ICD-10-CM | POA: Diagnosis not present

## 2020-08-06 HISTORY — DX: Varicose veins of bilateral lower extremities with pain: I83.813

## 2020-08-06 HISTORY — DX: Cardiac arrhythmia, unspecified: I49.9

## 2020-08-06 LAB — CBC
HCT: 47.3 % — ABNORMAL HIGH (ref 36.0–46.0)
Hemoglobin: 15.2 g/dL — ABNORMAL HIGH (ref 12.0–15.0)
MCH: 32.2 pg (ref 26.0–34.0)
MCHC: 32.1 g/dL (ref 30.0–36.0)
MCV: 100.2 fL — ABNORMAL HIGH (ref 80.0–100.0)
Platelets: 189 10*3/uL (ref 150–400)
RBC: 4.72 MIL/uL (ref 3.87–5.11)
RDW: 13.8 % (ref 11.5–15.5)
WBC: 6.1 10*3/uL (ref 4.0–10.5)
nRBC: 0 % (ref 0.0–0.2)

## 2020-08-06 LAB — BASIC METABOLIC PANEL
Anion gap: 9 (ref 5–15)
BUN: 28 mg/dL — ABNORMAL HIGH (ref 8–23)
CO2: 25 mmol/L (ref 22–32)
Calcium: 9.4 mg/dL (ref 8.9–10.3)
Chloride: 106 mmol/L (ref 98–111)
Creatinine, Ser: 1.17 mg/dL — ABNORMAL HIGH (ref 0.44–1.00)
GFR, Estimated: 42 mL/min — ABNORMAL LOW (ref >60)
Glucose, Bld: 93 mg/dL (ref 70–99)
Potassium: 6 mmol/L — ABNORMAL HIGH (ref 3.5–5.1)
Sodium: 140 mmol/L (ref 135–145)

## 2020-08-06 NOTE — Progress Notes (Signed)
PCP Dr [name]  Cardiologist Dr Ena Dawley  Pt is on Xarelto prescribed per Dr [name] for A Fib pt was instructed to contact MD office to discuss if needs to stop prior to surgery date. Pt verbalized understanding.  EKG (epic) 04/08/2020 Stress Test (epic) 04/01/2020 ECHO (epic) 11/10/2017  Patient verbalized understanding of instructions that were given to them at the PAT appointment. Patient was also instructed that they will need to review over the PAT instructions again at home before surgery.

## 2020-08-06 NOTE — Progress Notes (Signed)
BMP results routed to Dr Alinda Money

## 2020-08-06 NOTE — Progress Notes (Signed)
Anesthesia Chart Review   Case: [id number] Date/Time: 08/13/20 1045   Procedure: CYSTOSCOPY WITH RETROGRADE PYELOGRAM/URETEROSCOPY WITH BIOPSY/URETERAL STENT PLACEMENT (Left )   Anesthesia type: General   Pre-op diagnosis: LEFT URETERAL NEOPLASM   Location: [facility]   Surgeons: Raynelle Bring, MD      DISCUSSION:86 y.o. never smoker with h/o HTN, CKD Stage III, atrial fibrillation (on Xarelto), systolic CHF, left ureteral neoplasm scheduled for above procedure 08/13/2020 with Dr. Raynelle Bring.   Last seen by cardiology 04/08/2020.  Lexiscan 04/01/2020 normal.  Per OV note pt is very active without cv sx.  BP fluctuates, advised to take losartan daily as she had previously been taking prn.  Elevated BP at PAT visit.  Evaluate DOS.     VS: BP (!) 186/114 Comment: per right arm; rechecked end of visit per left arm 160/95  Pulse 94   Temp 36.7 C (Oral)   Resp 18   Ht _0  (1.575 m)   Wt 52.7 kg   SpO2 99%   BMI 21.25 kg/m   PROVIDERS: Algis Greenhouse, MD is PCP   Ena Dawley, MD is Cardiologist  LABS: Labs reviewed: Acceptable for surgery. (all labs ordered are listed, but only abnormal results are displayed)  Labs Reviewed  BASIC METABOLIC PANEL - Abnormal; Notable for the following components:      Result Value   Potassium 6.0 (*)    BUN 28 (*)    Creatinine, Ser 1.17 (*)    GFR, Estimated 42 (*)    All other components within normal limits  CBC - Abnormal; Notable for the following components:   Hemoglobin 15.2 (*)    HCT 47.3 (*)    MCV 100.2 (*)    All other components within normal limits     IMAGES:   EKG: 04/08/2020 Rate 89 bpm  A-fib  Poor R wave progression  No change since last tracing   CV: Myocardial Perfusion 04/01/2020  Nuclear stress EF: 61%.  There was no ST segment deviation noted during stress.  The left ventricular ejection fraction is normal (55-65%).  The study is normal.  This is a low risk study.  No  ischemia.    Echo 11/10/2017 Study Conclusions   - Left ventricle: The cavity size was normal. Wall thickness was  increased in a pattern of mild LVH. Systolic function was mildly  to moderately reduced. The estimated ejection fraction was in the  range of 40% to 45%. There is dyskinesis of the anteroseptal  myocardium.  - Aortic valve: There was mild regurgitation.  - Mitral valve: Calcified annulus. There was moderate  regurgitation.  - Left atrium: The atrium was severely dilated.  - Right atrium: The atrium was severely dilated.  - Tricuspid valve: There was moderate regurgitation.   Impressions:   - Dyskinesis of the septum with overall mild to moderate LV  dysfunction (EF 40); mild LVH; AI; moderate MR; severe biatrial  enlargement; moderate TR.  Past Medical History:  Diagnosis Date  . Cardiomyopathy ([place])    Echo 1/19: mild LVH, EF 40-45, ant-sept DK, mild AI, MAC, mod MR, severe BAE, mod TR  . Chronic atrial fibrillation ([place])   . Chronic systolic CHF (congestive heart failure) ([place])    a. dx 10/2017 but sx dating back to 08/2017.  . CKD (chronic kidney disease), stage III ([facility])    a. ? CKD - baseline Cr 0.8-1.1.  Marland Kitchen Dysrhythmia   . Hx of colonic polyps   .  Hyperlipidemia   . Hypertension   . Mild carotid artery disease ([place])    a. Duplex 2014 mild carotid plaque 0-39% BICA.  Marland Kitchen Moderate mitral regurgitation 10/2017  . Moderate tricuspid regurgitation 10/2017  . PVC's (premature ventricular contractions)   . Uterine cancer ([place])   . Varicose veins of leg with pain, bilateral     Past Surgical History:  Procedure Laterality Date  . ABDOMINAL HYSTERECTOMY    . APPENDECTOMY    . COLON SURGERY    . EYE SURGERY     cataract surgery bilat with lens implants  . PARTIAL COLECTOMY    . TONSILLECTOMY      MEDICATIONS: . Cholecalciferol (VITAMIN D3) 125 MCG (5000 UT) TABS  . diltiazem (CARDIZEM CD) 120 MG 24 hr capsule  . furosemide (LASIX) 20 MG  tablet  . levothyroxine (SYNTHROID) 25 MCG tablet  . losartan (COZAAR) 25 MG tablet  . mometasone (ELOCON) 0.1 % cream  . XARELTO 15 MG TABS tablet   No current facility-administered medications for this encounter.     Konrad Felix, PA-C [facility] Pre-Surgical Testing [phone]

## 2020-08-10 ENCOUNTER — Other Ambulatory Visit (HOSPITAL_COMMUNITY)
Admission: RE | Admit: 2020-08-10 | Discharge: 2020-08-10 | Disposition: A | Discharge status: Home / Self Care | Payer: Medicare Other | Source: Ambulatory Visit | Attending: Urology | Admitting: Urology

## 2020-08-10 ENCOUNTER — Telehealth: Payer: Self-pay | Admitting: Cardiology

## 2020-08-10 DIAGNOSIS — Z20822 Contact with and (suspected) exposure to covid-19: Secondary | ICD-10-CM | POA: Diagnosis not present

## 2020-08-10 DIAGNOSIS — Z01812 Encounter for preprocedural laboratory examination: Secondary | ICD-10-CM | POA: Insufficient documentation

## 2020-08-10 LAB — SARS CORONAVIRUS 2 (TAT 6-24 HRS): SARS Coronavirus 2: NEGATIVE

## 2020-08-10 NOTE — Telephone Encounter (Signed)
Patient with diagnosis of afib on Xarelto 15mg  daily for anticoagulation.    Procedure: Cystoscopy with left arthroscopy and left ureteral stent placement Date of procedure: 08/13/20  :[id number]}  CHA2DS2-VASc Score = 6  This indicates a 9.7% annual risk of stroke. The patient's score is based upon: CHF History: 1 HTN History: 1 Diabetes History: 0 Stroke History: 0 Vascular Disease History: 1 Age Score: 2 Gender Score: 1  CrCl 75mL/min (on appropriately reduced dose of Xarelto) Platelet count 189K  Per office protocol, patient can hold Xarelto for 3 days prior to procedure. Of note, procedure is in 3 days so pt will need to start holding Xarelto this evening.

## 2020-08-10 NOTE — Telephone Encounter (Signed)
   [facility] Pre-operative Risk Assessment    HEARTCARE STAFF: - Please ensure there is not already an duplicate clearance open for this procedure. - Under Visit Info/Reason for Call, type in Other and utilize the format Clearance MM/DD/YY or Clearance TBD. Do not use dashes or single digits. - If request is for dental extraction, please clarify the # of teeth to be extracted.  Request for surgical clearance:  1. What type of surgery is being performed? Cystoscopy with left arthroscopy and left ureteral stent placement  2. When is this surgery scheduled? 08/13/20   3. What type of clearance is required (medical clearance vs. Pharmacy clearance to hold med vs. Both)? both  4. Are there any medications that need to be held prior to surgery and how long? Xarelto, 48 to 72 hours. Prefer 72 if possible  5. Practice name and name of physician performing surgery? Dr. Raynelle Bring at [facility]  6. What is the office phone number? [phone] ext: [phone]   2.   What is the office fax number? [phone]  8.   Anesthesia type (None, local, MAC, general) ? General   Leah Newnam 08/10/2020, 1:20 PM  _________________________________________________________________   (provider comments below)

## 2020-08-11 NOTE — Telephone Encounter (Signed)
   Primary Cardiologist: Ena Dawley, MD  Chart reviewed as part of pre-operative protocol coverage. Patient was contacted 08/11/2020 in reference to pre-operative risk assessment for pending surgery as outlined below.  [place] R Lou[place] was last seen on 04/08/20 by Ermalinda Barrios.  Since that day, [place] Nevins has done well.  She can complete more than 4.0 METS without angina.   Per our clinical pharmacist: Patient with diagnosis of afib on Xarelto 15mg  daily for anticoagulation.    Procedure: Cystoscopy with left arthroscopy and left ureteral stent placement Date of procedure: 08/13/20  :[id number]}  CHA2DS2-VASc Score = 6  This indicates a 9.7% annual risk of stroke. The patient's score is based upon: CHF History: 1 HTN History: 1 Diabetes History: 0 Stroke History: 0 Vascular Disease History: 1 Age Score: 2 Gender Score: 1  CrCl 92mL/min (on appropriately reduced dose of Xarelto) Platelet count 189K  Per office protocol, patient can hold Xarelto for 3 days prior to procedure.   When I spoke with the patient, she had been instructed to hold xarelto starting 08/09/20.  Therefore, based on ACC/AHA guidelines, the patient would be at acceptable risk for the planned procedure without further cardiovascular testing.    I will route this recommendation to the requesting party via Epic fax function and remove from pre-op pool. Please call with questions.  Tami Lin [place], PA 08/11/2020, 8:35 AM

## 2020-08-12 NOTE — H&P (Signed)
Office Visit Report     07/28/2020   --------------------------------------------------------------------------------   Verm[place] D. Cordon  MRN: [id number]  DOB: 1929/11/23, 86 year old Female  SSN:    PRIMARY CARE:    REFERRING:  Donnal Debar  PROVIDER:  Raynelle Bring, M.D.  LOCATION:  [facility] [phone]     --------------------------------------------------------------------------------   CC/HPI: Left ureteral neoplasm and left ureteral obstruction   Sara Griffin is a very pleasant 86 year old female seen today at the request of Dr. Jonette Eva for a large left ureteral neoplasm and left ureteral obstruction. She initially presented to the hospital in [facility] with abdominal pain symptoms. She underwent a CT angiogram presumably to look for mesenteric ischemia. Ultimately, she appeared to have a partial small-bowel obstruction that resolved with nasogastric suction and conservative therapy. Incidentally, she was noted to have very thin left renal parenchyma with hydronephrosis and various areas of hyperdensity along the ureter including a fairly long course in the mid ureter concerning for probable bulky tumor. She did not have any regional lymphadenopathy or obvious evidence of metastatic disease. She has had some minimal hematuria despite anticoagulation with Xarelto for atrial fibrillation. She states that she may have had left-sided flank pain to her 3 years ago but this gradually resolved and currently has no pain symptoms.   Her past medical history is significant for peripheral vascular disease, atrial fibrillation, stage 3 chronic kidney disease with a baseline serum creatinine of approximately 1.2, cardiomyopathy with a baseline ejection fraction of 45-50%, hyperlipidemia, hypertension, hypothyroidism, and uterine cancer status post hysterectomy many years ago.   Her past surgical history is significant for a subtotal colectomy and appendectomy as well as  a hysterectomy/oophorectomy. These were both done via a midline incision extending from just above her umbilicus to her lower abdomen.     ALLERGIES: None   MEDICATIONS: Diltiazem Hcl 120 mg tablet  Furosemide 40 mg tablet  Levothyroxine 25 mcg capsule  Xarelto 15 mg (42)- 20 mg (9) tablet, dose pack     GU PSH: Hysterectomy     NON-GU PSH: Colonoscopy Partial colectomy     GU PMH: Uterine Cancer, part Unspec    NON-GU PMH: Acute on chronic diastolic (congestive) heart failure Chronic kidney disease, stage 3 unspecified Localized edema Malignant neoplasm of major salivary gland, unspecified Myiasis, unspecified Other fatigue Other specified symptoms and signs involving the circulatory and respiratory systems Polyp of colon Unspecified atrial fibrillation Unspecified hearing loss, unspecified ear    FAMILY HISTORY: 4 sons - Runs in Family Heart Attack - Runs in Family Heart Disease - Runs in Family   SOCIAL HISTORY: Marital Status: Unknown Preferred Language: English; Ethnicity: Not Hispanic Or Latino; Race: White Current Smoking Status: Patient has never smoked.   Tobacco Use Assessment Completed: Used Tobacco in last 30 days? Drinks 1 caffeinated drink per day.    REVIEW OF SYSTEMS:    GU Review Female:   Patient denies frequent urination, hard to postpone urination, burning /pain with urination, get up at night to urinate, leakage of urine, stream starts and stops, trouble starting your stream, have to strain to urinate, and currently pregnant.  Gastrointestinal (Lower):   Patient denies diarrhea and constipation.  Gastrointestinal (Upper):   Patient denies nausea and vomiting.  Constitutional:   Patient denies fever, night sweats, weight loss, and fatigue.  Skin:   Patient denies skin rash/ lesion and itching.  Eyes:   Patient denies blurred vision and double vision.  Ears/ Nose/ Throat:   Patient denies sore throat and sinus problems.  Hematologic/Lymphatic:    Patient denies swollen glands and easy bruising.  Cardiovascular:   Patient denies leg swelling and chest pains.  Respiratory:   Patient denies cough and shortness of breath.  Endocrine:   Patient denies excessive thirst.  Musculoskeletal:   Patient denies back pain and joint pain.  Neurological:   Patient denies headaches and dizziness.  Psychologic:   Patient denies depression and anxiety.   VITAL SIGNS:      07/28/2020 01:15 PM  Weight 115 lb / 52.16 kg  Height 62 in / 157.48 cm  BP 149/90 mmHg  Pulse 88 /min  Temperature 96.8 F / 36 C  BMI 21.0 kg/m   MULTI-SYSTEM PHYSICAL EXAMINATION:    Constitutional: Well-nourished. No physical deformities. Normally developed. Good grooming.  Neck: Neck symmetrical, not swollen. Normal tracheal position.  Respiratory: No labored breathing, use of accessory muscles. Clear bilaterally.  Cardiovascular: Normal temperature, normal extremity pulses, no swelling, no varicosities. Irregular.  Lymphatic: No enlargement of neck, axillae, groin.  Skin: No paleness, no jaundice, no cyanosis. No lesion, no ulcer, no rash.  Neurologic / Psychiatric: Oriented to time, oriented to place, oriented to person. No depression, no anxiety, no agitation.  Gastrointestinal: No mass, no tenderness, no rigidity, non obese abdomen. Well-healed scar extending from just above her umbilicus down to her pubic symphysis. In the midline.  Eyes: Normal conjunctivae. Normal eyelids.  Ears, Nose, Mouth, and Throat: Left ear no scars, no lesions, no masses. Right ear no scars, no lesions, no masses. Nose no scars, no lesions, no masses. Normal hearing. Normal lips.  Musculoskeletal: Normal gait and station of head and neck.     Complexity of Data:  Records Review:   Previous Patient Records  X-Ray Review: C.T. Abdomen/Pelvis: Reviewed Films.    Notes:                     CLINICAL DATA: Concern for mesenteric ischemia, gradual onset of  abdominal pain at 2 p.m., diffuse  and worsening with time, described  as bloating, new from prior   EXAM:  CTA ABDOMEN AND PELVIS WITHOUT AND WITH CONTRAST   TECHNIQUE:  Multidetector CT imaging of the abdomen and pelvis was performed  using the standard protocol during bolus administration of  intravenous contrast. Multiplanar reconstructed images and MIPs were  obtained and reviewed to evaluate the vascular anatomy.   CONTRAST: 100 mL Omnipaque 350 IV   COMPARISON: Lumbar radiograph 05/22/2018 (report only)   FINDINGS:  VASCULAR   Aorta: Calcified noncalcified atheromatous plaque throughout the  distal thoracic and native abdominal aorta. No significant stenosis  or occlusion. No acute luminal abnormality. No aneurysm or ectasia.  No features of aortitis/vasculitis.   Celiac: Calcified ostial plaque resulting in mild ostial narrowing.  Vessel opacifies normally. Additional plaque noted throughout the  splenic artery likely with some at most mild segmental stenoses. No  other significant stenosis or discernible proximal occlusions. No  acute luminal abnormality. No visible aneurysm or features of  vasculitis.   SMA: Ostial plaque resulting in at most moderate ostial narrowing.  Vessel is normally opacified however with a typical branching  pattern. No acute luminal abnormality. No aneurysm or ectasia. No  features of vasculitis.   Renals: Single right and paired left renal arteries. The right renal  artery demonstrates extensive calcified noncalcified ostial plaque  resulting in likely severe stenotic occlusion at the ostium.  Normally  opacified distally without other significant stenosis or  occlusion. No evidence of aneurysm, dissection or vasculitis.   Diminutive paired left renal arteries supply the upper and lower  pole moieties separately. The lower pole artery paradoxically  arising superior to the upper pole likely with high-grade ostial  plaque narrowing and resulting poor opacification. No  discernible  aneurysm or ectasia. No features of fibromuscular dysplasia.   IMA: Moderate to severe stenosis of the IMA origin. Vessel otherwise  normally opacified without significant stenosis or occlusions. No  evidence of aneurysm, dissection or vasculitis.   Inflow: Predominately calcified plaque throughout the common,  internal external iliac arteries. No evidence of aneurysm,  dissection or vasculitis.   Proximal Outflow: Atheromatous plaque seen in the common femoral,  and superficial femoral arteries as well as the ostia of the  profundus arteries. No evidence of aneurysm, dissection or  vasculitis.   Veins: Mixing artifact in IVC. Hepatic and portal veins appear  widely patent. Mixing artifact in the IVC. No major venous  abnormalities   Review of the MIP images confirms the above findings.   NON-VASCULAR   Lower chest: Reticular areas of scarring and bronchiectatic change  present in both lung bases. Calcific pleural plaques noted in the  right lung base. Cardiomegaly is present with marked right heart  enlargement particularly of the right atrium with reflux of contrast  into the hepatic veins and IVC.   Hepatobiliary: There is heterogeneous enhancement of the liver with  periportal edema which may be seen in the setting of hepatitis or  hepatic congestion. Prominent Riedel's lobe. No focal concerning  liver lesion is identified. Smooth liver surface contour.  Gallbladder partially decompressed containing several dependently  layering calcified gallstones without pericholecystic fluid or  inflammation. No biliary ductal dilatation or visible intraductal  gallstones.   Pancreas: Unremarkable. No pancreatic ductal dilatation or  surrounding inflammatory changes.   Spleen: Normal in size. No concerning splenic lesions. Vascular  calcifications towards the splenic hilum.   Adrenals/Urinary Tract: No suspicious adrenal lesions. Asymmetric  left cortical thinning  and delayed nephrogram of the left kidney  with severe left hydroureteronephrosis likely secondary to an  enhancing soft tissue lesion present within the mid left ureter  (6/35 distending the ureter up to 9 x 10 mm in transaxial dimension  and extending over a length of 7.5 cm through the mid ureter.  Additional nodular soft tissue density and enhancement noted in the  distal ureter and at the left ureterovesicular junction possibly  protruding into the bladder lumen from the ureter or arising from  the bladder wall itself, appearing quite nodular in measuring up to  13 x 13 mm. Urinary bladder is otherwise unremarkable. No visible  urolithiasis. No right urinary tract dilatation. No intrinsic renal  lesions are identified.   Stomach/Bowel: Circumferentially thickened distal thoracic  esophagus. Stomach is moderately distended with air and fluid. Some  mild thickening towards the antrum is nonspecific possibly related  to underdistention or peristalsis. Duodenum with a normal sweep  across the midline abdomen. There is gradual air and fluid  distention of the mid to distal small bowel which begins in the left  upper quadrant (6/35 and ends at the level of a small bowel feces  sign in the right lower quadrant with abrupt decompression to  several edematous Lea thickened loops of small bowel along the right  pelvic sidewall (6/58 there are surgical clips in this vicinity  which could suggest an obstruction secondary to adhesion  though  inflammation of the collapsed bowel is an additional consideration.  More distal small bowel is largely decompressed. Much of the colon  is also decompressed. Few noninflamed colonic diverticula.   Lymphatic: Some reactive adenopathy in the abdomen.   Reproductive: Patient appears to be post hysterectomy. No concerning  adnexal lesions.   Other: Some hazy mesenteric stranding and trace reactive free fluid  in the mesenteric leaflets of the distended  small bowel loops could  reflect some early reactive change. No free air or evidence of frank  perforation. Surgical clips present along the right pelvic sidewall  and in the left lower quadrant as well. No bowel containing hernias.  Postsurgical changes from prior low vertical midline incision.   Musculoskeletal: Multilevel degenerative changes are present in the  imaged portions of the spine, hips and pelvis. No acute osseous  abnormality or suspicious osseous lesion.   IMPRESSION:  VASCULAR:   1. Aortic Atherosclerosis (ICD10-I70.0).  2. Mild stenosis at the celiac axis.  3. Moderate stenosis at the SMA origin.  4. Extensive plaque through the splenic artery with mild-to-moderate  multi segmental narrowing.  5. Severe stenosis at the origin of the right renal artery  6. Diminutive paired left renal arteries arising separately. The  lower pole artery is paradoxically arising superior to the upper  pole, likely with high-grade ostial plaque narrowing.  7. Moderate to severe narrowing at the IMA origin.  8. Additional plaque throughout the inflow and outflow vessels  without significant stenosis or occlusion.  9. Marked right heart enlargement with reflux of contrast into the  hepatic veins and IVC. Findings are suggestive of right heart  failure and hepatic congestion with heterogeneity of the liver and  periportal edema. Correlate with LFTs.   NONVASCULAR:   1. Likely early developing or partial small bowel obstruction with a  gradual dilatation proximally and focal transition point in the  right lower quadrant adjacent an area of decompressed but thickened  and enhancing small bowel. A surgical clip is also seen in the  immediate vicinity. Potential obstructive etiologies would include  inflammation of the decompressed segment, adhesion or less likely  mass.  2. Severe left hydroureteronephrosis likely secondary to an  enhancing soft tissue lesion in the left ureter  measuring up to 9 x  10 mm in transaxial dimension and over a length of 7.5 cm through  the mid ureter. Additional nodular soft tissue density and  enhancement noted in the distal ureter and left ureterovesicular  junction possibly protruding into the bladder lumen from the ureter  or arising from the bladder wall itself. Findings are concerning for  multifocal urothelial malignancy.  3. Cholelithiasis without evidence of acute cholecystitis.  4. Aortic Atherosclerosis (ICD10-I70.0).   These results were called by telephone at the time of interpretation  on 06/12/2020 at 12:26 am to provider Trellis Paganini , who  verbally acknowledged these results.   Electronically Signed:  By: Lovena Le M.D.  On: 06/12/2020 00:27   ADDENDUM REPORT: 06/12/2020 06:05   ADDENDUM:  Additional impression point:   Circumferential distal thoracic esophageal thickening, a nonspecific  finding. Could reflect features of esophagitis with appropriate  clinical symptoms though should consider direct visualization  following patient stabilization or on an outpatient basis.    Electronically Signed  By: Lovena Le M.D.  On: 06/12/2020 06:05   PROCEDURES:          Urinalysis w/Scope - 81001 Dipstick Dipstick Cont'd Micro  Specimen: Voided Bilirubin: Neg WBC/hpf: 0 -  5/hpf  Color: Yellow Ketones: Neg RBC/hpf: 10 - 20/hpf  Appearance: Clear Blood: 1+ Bacteria: Few (10-25/hpf)  Specific Gravity: 1.025 Protein: Trace Cystals: NS (Not Seen)  pH: 5.5 Urobilinogen: 0.2 Casts: NS (Not Seen)  Glucose: Neg Nitrites: Neg Trichomonas: Not Present    Leukocyte Esterase: Neg Mucous: Not Present      Epithelial Cells: 0 - 5/hpf      Yeast: NS (Not Seen)      Sperm: Not Present    Notes:      ASSESSMENT:      ICD-10 Details  1 GU:   Ureter, left, Neoplasm of uncertain behavior - G25.42   2   Ureteral obstruction - N13.1    PLAN:           Orders Labs Urine Culture          Schedule Return  Visit/Planned Activity: Other See Visit Notes             Note: Will call to schedule surgery          Document Letter(s):  Created for Patient: Clinical Summary         Notes:   1. Left ureteral obstruction with left ureteral neoplasm: I had a discussion with her and her son, Richie. We discussed the fact that the findings under CT scan most likely represent urothelial carcinoma. Considering the fact that she appears to have chronic obstruction of the left kidney with fairly large tumor burden, this is hopefully low-grade disease. However, there is significant bulk to her tumor burden that is likely to render endoscopic treatment unsuccessful.   I have recommended that she proceed with further diagnostic evaluation including cystoscopy, left retrograde pyelography, ureteral biopsy, and possible left ureteral stent placement. She will then follow-up to discuss these results and we will consider further definitive therapy. She does have an appointment scheduled with her cardiologist, Dr. Ottie Glazier, in early November. She understands that she may ultimately require major surgical intervention with a left robotic assisted laparoscopic nephroureterectomy. She would need a cardiac risk assessment from [name] prior to proceeding with this major operation. However, I do think we can proceed with minor endoscopic diagnostic evaluation in the meantime. She will need to stop her anticoagulation for 48 hours prior to this procedure. All questions were answered to their stated satisfaction.   She also likely will benefit from a renogram if she does require nephrectomy to determine the relative renal function although I suspect she has minimal function from her left kidney based on her CT scan.   Cc: Dr. Jonette Eva  Dr. [name]  Dr. Ottie Glazier         Next Appointment:      Next Appointment: 07/28/2020 01:00 PM    Appointment Type: 64 Minute Consult Alinda Money    Location: [facility] [phone]    Provider: Raynelle Bring, M.D.    Reason for Visit: cancer consult-Kaycee Mcgaugh      * Signed by Raynelle Bring, M.D. on 07/28/20 at 5:20 PM (EDT)*

## 2020-08-13 ENCOUNTER — Encounter (HOSPITAL_COMMUNITY): Payer: Self-pay | Admitting: Urology

## 2020-08-13 ENCOUNTER — Ambulatory Visit (HOSPITAL_COMMUNITY): Payer: Medicare Other | Admitting: Certified Registered"

## 2020-08-13 ENCOUNTER — Ambulatory Visit (HOSPITAL_COMMUNITY): Payer: Medicare Other | Admitting: Physician Assistant

## 2020-08-13 ENCOUNTER — Ambulatory Visit (HOSPITAL_COMMUNITY): Payer: Medicare Other

## 2020-08-13 ENCOUNTER — Ambulatory Visit (HOSPITAL_COMMUNITY)
Admission: RE | Admit: 2020-08-13 | Discharge: 2020-08-13 | Disposition: A | Discharge status: Home / Self Care | Payer: Medicare Other | Attending: Urology | Admitting: Urology

## 2020-08-13 ENCOUNTER — Encounter (HOSPITAL_COMMUNITY)
Admission: RE | Disposition: A | Discharge status: Home / Self Care | Payer: Self-pay | Source: Home / Self Care | Attending: Urology

## 2020-08-13 DIAGNOSIS — I4891 Unspecified atrial fibrillation: Secondary | ICD-10-CM | POA: Insufficient documentation

## 2020-08-13 DIAGNOSIS — Z8542 Personal history of malignant neoplasm of other parts of uterus: Secondary | ICD-10-CM | POA: Insufficient documentation

## 2020-08-13 DIAGNOSIS — Z7901 Long term (current) use of anticoagulants: Secondary | ICD-10-CM | POA: Insufficient documentation

## 2020-08-13 DIAGNOSIS — Z90710 Acquired absence of both cervix and uterus: Secondary | ICD-10-CM | POA: Insufficient documentation

## 2020-08-13 DIAGNOSIS — I700 Atherosclerosis of aorta: Secondary | ICD-10-CM | POA: Diagnosis not present

## 2020-08-13 DIAGNOSIS — N131 Hydronephrosis with ureteral stricture, not elsewhere classified: Secondary | ICD-10-CM | POA: Diagnosis not present

## 2020-08-13 DIAGNOSIS — E785 Hyperlipidemia, unspecified: Secondary | ICD-10-CM | POA: Diagnosis not present

## 2020-08-13 DIAGNOSIS — I130 Hypertensive heart and chronic kidney disease with heart failure and stage 1 through stage 4 chronic kidney disease, or unspecified chronic kidney disease: Secondary | ICD-10-CM | POA: Insufficient documentation

## 2020-08-13 DIAGNOSIS — I5033 Acute on chronic diastolic (congestive) heart failure: Secondary | ICD-10-CM | POA: Insufficient documentation

## 2020-08-13 DIAGNOSIS — Z8589 Personal history of malignant neoplasm of other organs and systems: Secondary | ICD-10-CM | POA: Insufficient documentation

## 2020-08-13 DIAGNOSIS — K8020 Calculus of gallbladder without cholecystitis without obstruction: Secondary | ICD-10-CM | POA: Diagnosis not present

## 2020-08-13 DIAGNOSIS — Z79899 Other long term (current) drug therapy: Secondary | ICD-10-CM | POA: Diagnosis not present

## 2020-08-13 DIAGNOSIS — C676 Malignant neoplasm of ureteric orifice: Secondary | ICD-10-CM | POA: Insufficient documentation

## 2020-08-13 DIAGNOSIS — Z8249 Family history of ischemic heart disease and other diseases of the circulatory system: Secondary | ICD-10-CM | POA: Insufficient documentation

## 2020-08-13 DIAGNOSIS — Z9049 Acquired absence of other specified parts of digestive tract: Secondary | ICD-10-CM | POA: Diagnosis not present

## 2020-08-13 DIAGNOSIS — I739 Peripheral vascular disease, unspecified: Secondary | ICD-10-CM | POA: Diagnosis not present

## 2020-08-13 DIAGNOSIS — I429 Cardiomyopathy, unspecified: Secondary | ICD-10-CM | POA: Diagnosis not present

## 2020-08-13 DIAGNOSIS — N183 Chronic kidney disease, stage 3 unspecified: Secondary | ICD-10-CM | POA: Diagnosis not present

## 2020-08-13 DIAGNOSIS — I472 Ventricular tachycardia: Secondary | ICD-10-CM | POA: Diagnosis not present

## 2020-08-13 DIAGNOSIS — N135 Crossing vessel and stricture of ureter without hydronephrosis: Secondary | ICD-10-CM | POA: Diagnosis present

## 2020-08-13 DIAGNOSIS — I083 Combined rheumatic disorders of mitral, aortic and tricuspid valves: Secondary | ICD-10-CM | POA: Diagnosis not present

## 2020-08-13 DIAGNOSIS — E039 Hypothyroidism, unspecified: Secondary | ICD-10-CM | POA: Diagnosis not present

## 2020-08-13 DIAGNOSIS — Z86010 Personal history of colonic polyps: Secondary | ICD-10-CM | POA: Diagnosis not present

## 2020-08-13 HISTORY — PX: CYSTOSCOPY W/ URETERAL STENT PLACEMENT: SHX1429

## 2020-08-13 SURGERY — CYSTOSCOPY, WITH RETROGRADE PYELOGRAM AND URETERAL STENT INSERTION
Anesthesia: General | Laterality: Left

## 2020-08-13 MED ORDER — LIDOCAINE 2% (20 MG/ML) 5 ML SYRINGE
INTRAMUSCULAR | Status: DC | PRN
  Administered 2020-08-13: 50 mg via INTRAVENOUS

## 2020-08-13 MED ORDER — FENTANYL CITRATE (PF) 100 MCG/2ML IJ SOLN
INTRAMUSCULAR | Status: AC
  Filled 2020-08-13: qty 2

## 2020-08-13 MED ORDER — ONDANSETRON HCL 4 MG/2ML IJ SOLN: 4.0000 mg | Freq: Once | INTRAMUSCULAR | Status: DC | PRN

## 2020-08-13 MED ORDER — LACTATED RINGERS IV SOLN: INTRAVENOUS | Status: DC

## 2020-08-13 MED ORDER — CEFAZOLIN SODIUM-DEXTROSE 2-4 GM/100ML-% IV SOLN
2.0000 g | Freq: Once | INTRAVENOUS | Status: AC
  Administered 2020-08-13: 2 g via INTRAVENOUS
  Filled 2020-08-13: qty 100

## 2020-08-13 MED ORDER — PHENYLEPHRINE 40 MCG/ML (10ML) SYRINGE FOR IV PUSH (FOR BLOOD PRESSURE SUPPORT)
PREFILLED_SYRINGE | INTRAVENOUS | Status: DC | PRN
  Administered 2020-08-13 (×2): 80 ug via INTRAVENOUS

## 2020-08-13 MED ORDER — PROPOFOL 10 MG/ML IV BOLUS
INTRAVENOUS | Status: DC | PRN
  Administered 2020-08-13: 110 mg via INTRAVENOUS

## 2020-08-13 MED ORDER — PROPOFOL 10 MG/ML IV BOLUS
INTRAVENOUS | Status: AC
  Filled 2020-08-13: qty 20

## 2020-08-13 MED ORDER — FENTANYL CITRATE (PF) 100 MCG/2ML IJ SOLN
INTRAMUSCULAR | Status: DC | PRN
  Administered 2020-08-13 (×3): 25 ug via INTRAVENOUS

## 2020-08-13 MED ORDER — ONDANSETRON HCL 4 MG/2ML IJ SOLN
INTRAMUSCULAR | Status: AC
  Filled 2020-08-13: qty 2

## 2020-08-13 MED ORDER — PHENYLEPHRINE 40 MCG/ML (10ML) SYRINGE FOR IV PUSH (FOR BLOOD PRESSURE SUPPORT)
PREFILLED_SYRINGE | INTRAVENOUS | Status: AC
  Filled 2020-08-13: qty 10

## 2020-08-13 MED ORDER — ORAL CARE MOUTH RINSE: 15.0000 mL | Freq: Once | OROMUCOSAL | Status: AC

## 2020-08-13 MED ORDER — SODIUM CHLORIDE 0.9 % IR SOLN
Status: DC | PRN
  Administered 2020-08-13: 6000 mL via INTRAVESICAL

## 2020-08-13 MED ORDER — DEXAMETHASONE SODIUM PHOSPHATE 10 MG/ML IJ SOLN
INTRAMUSCULAR | Status: DC | PRN
  Administered 2020-08-13: 8 mg via INTRAVENOUS

## 2020-08-13 MED ORDER — ONDANSETRON HCL 4 MG/2ML IJ SOLN
INTRAMUSCULAR | Status: DC | PRN
  Administered 2020-08-13: 4 mg via INTRAVENOUS

## 2020-08-13 MED ORDER — FENTANYL CITRATE (PF) 100 MCG/2ML IJ SOLN: 25.0000 ug | INTRAMUSCULAR | Status: DC | PRN

## 2020-08-13 MED ORDER — LIDOCAINE 2% (20 MG/ML) 5 ML SYRINGE
INTRAMUSCULAR | Status: AC
  Filled 2020-08-13: qty 5

## 2020-08-13 MED ORDER — EPHEDRINE 5 MG/ML INJ
INTRAVENOUS | Status: AC
  Filled 2020-08-13: qty 10

## 2020-08-13 MED ORDER — CHLORHEXIDINE GLUCONATE 0.12 % MT SOLN
15.0000 mL | Freq: Once | OROMUCOSAL | Status: AC
  Administered 2020-08-13: 15 mL via OROMUCOSAL

## 2020-08-13 MED ORDER — DEXAMETHASONE SODIUM PHOSPHATE 10 MG/ML IJ SOLN
INTRAMUSCULAR | Status: AC
  Filled 2020-08-13: qty 1

## 2020-08-13 SURGICAL SUPPLY — 14 items
BAG URO CATCHER STRL LF (MISCELLANEOUS) ×3 IMPLANT
CATH INTERMIT  6FR 70CM (CATHETERS) ×3 IMPLANT
CLOTH BEACON ORANGE TIMEOUT ST (SAFETY) ×3 IMPLANT
GLOVE BIOGEL M STRL SZ7.5 (GLOVE) ×15 IMPLANT
GOWN STRL REUS W/TWL LRG LVL3 (GOWN DISPOSABLE) ×9 IMPLANT
GUIDEWIRE STR DUAL SENSOR (WIRE) ×3 IMPLANT
GUIDEWIRE ZIPWRE .038 STRAIGHT (WIRE) IMPLANT
KIT TURNOVER KIT A (KITS) ×3 IMPLANT
LOOP CUT BIPOLAR 24F LRG (ELECTROSURGICAL) ×3 IMPLANT
MANIFOLD NEPTUNE II (INSTRUMENTS) ×3 IMPLANT
PACK CYSTO (CUSTOM PROCEDURE TRAY) ×3 IMPLANT
TUBING CONNECTING 10 (TUBING) ×2 IMPLANT
TUBING CONNECTING 10' (TUBING) ×1
TUBING UROLOGY SET (TUBING) ×3 IMPLANT

## 2020-08-13 NOTE — Discharge Instructions (Addendum)
1. You may see some blood in the urine and may have some burning with urination for 48-72 hours. You also may notice that you have to urinate more frequently or urgently after your procedure which is normal.  2. You should call should you develop an inability urinate, fever > 101, persistent nausea and vomiting that prevents you from eating or drinking to stay hydrated.  3. If you have a catheter, you will be taught how to take care of the catheter by the nursing staff prior to discharge from the hospital.  You may periodically feel a strong urge to void with the catheter in place.  This is a bladder spasm and most often can occur when having a bowel movement or moving around. It is typically self-limited and usually will stop after a few minutes.  You may use some Vaseline or Neosporin around the tip of the catheter to reduce friction at the tip of the penis. You may also see some blood in the urine.  A very small amount of blood can make the urine look quite red.  As long as the catheter is draining well, there usually is not a problem.  However, if the catheter is not draining well and is bloody, you should call the office [phone] to notify us.   Ok to restart Xarelto on Monday if no further blood in urine.

## 2020-08-13 NOTE — Anesthesia Postprocedure Evaluation (Signed)
Anesthesia Post Note  Patient: Sara Griffin  Procedure(s) Performed: CYSTOSCOPY WITH TRANSURETHRAL RESECTION OF BLADDER/URETERAL TUMOR (Left )     Patient location during evaluation: PACU Anesthesia Type: General Level of consciousness: awake and alert and oriented Pain management: pain level controlled Vital Signs Assessment: post-procedure vital signs reviewed and stable Respiratory status: spontaneous breathing, nonlabored ventilation and respiratory function stable Cardiovascular status: blood pressure returned to baseline and stable Postop Assessment: no apparent nausea or vomiting Anesthetic complications: no   No complications documented.  Last Vitals:  Vitals:   08/13/20 1215 08/13/20 1230  BP: (!) 167/99 (!) 156/87  Pulse: 77 78  Resp: 13 17  Temp:    SpO2: 100% 100%    Last Pain:  Vitals:   08/13/20 1230  TempSrc:   PainSc: 0-No pain                 Terrez Ander A.

## 2020-08-13 NOTE — Transfer of Care (Signed)
Immediate Anesthesia Transfer of Care Note  Patient: Sara Griffin  Procedure(s) Performed: CYSTOSCOPY WITH TRANSURETHRAL RESECTION OF BLADDER/URETERAL TUMOR (Left )  Patient Location: PACU  Anesthesia Type:General  Level of Consciousness: drowsy, patient cooperative and responds to stimulation  Airway & Oxygen Therapy: Patient Spontanous Breathing and Patient connected to face mask oxygen  Post-op Assessment: Report given to RN and Post -op Vital signs reviewed and stable  Post vital signs: Reviewed and stable  Last Vitals:  Vitals Value Taken Time  BP 160/96 08/13/20 1139  Temp    Pulse 77 08/13/20 1141  Resp 11 08/13/20 1141  SpO2 100 % 08/13/20 1141  Vitals shown include unvalidated device data.  Last Pain:  Vitals:   08/13/20 0910  TempSrc:   PainSc: 0-No pain         Complications: No complications documented.

## 2020-08-13 NOTE — Anesthesia Preprocedure Evaluation (Addendum)
Anesthesia Evaluation  Patient identified by MRN, date of birth, ID band Patient awake    Reviewed: Allergy & Precautions, NPO status , Patient's Chart, lab work & pertinent test results  Airway Mallampati: II  TM Distance: >3 FB Neck ROM: Full    Dental  (+) Lower Dentures, Upper Dentures   Pulmonary neg pulmonary ROS,    Pulmonary exam normal breath sounds clear to auscultation       Cardiovascular hypertension, Pt. on medications +CHF  Normal cardiovascular exam+ dysrhythmias Atrial Fibrillation and Supra Ventricular Tachycardia + Valvular Problems/Murmurs MR  Rhythm:Irregular Rate:Normal  Myocardial perfusion study 04/01/20  Nuclear stress EF: 61%.  There was no ST segment deviation noted during stress.  The left ventricular ejection fraction is normal (55-65%).  The study is normal.  This is a low risk study.  No ischemia.   EKG 04/08/20 Atrial Fibrillation, anterior infarct, LAD unchanged  Echo 11/10/17 Left ventricle: The cavity size was normal. Wall thickness was increased in a pattern of mild LVH. Systolic function was mildlyto moderately reduced. The estimated ejection fraction was in therange of 40% to 45%. There is dyskinesis of the anteroseptal myocardium.  - Aortic valve: There was mild regurgitation.  - Mitral valve: Calcified annulus. There was moderate  regurgitation.  - Left atrium: The atrium was severely dilated.  - Right atrium: The atrium was severely dilated.  - Tricuspid valve: There was moderate regurgitation.    Neuro/Psych negative neurological ROS  negative psych ROS   GI/Hepatic   Endo/Other    Renal/GU Renal InsufficiencyRenal diseaseLeft ureteral neoplasm  negative genitourinary   Musculoskeletal   Abdominal   Peds  Hematology Xarelto therapy- last dose   Anesthesia Other Findings   Reproductive/Obstetrics                            Anesthesia  Physical Anesthesia Plan  ASA: III  Anesthesia Plan: General   Post-op Pain Management:    Induction: Intravenous  PONV Risk Score and Plan: 4 or greater and Ondansetron and Treatment may vary due to age or medical condition  Airway Management Planned: LMA  Additional Equipment:   Intra-op Plan:   Post-operative Plan: Extubation in OR  Informed Consent: I have reviewed the patients History and Physical, chart, labs and discussed the procedure including the risks, benefits and alternatives for the proposed anesthesia with the patient or authorized representative who has indicated his/her understanding and acceptance.     Dental advisory given  Plan Discussed with: CRNA and Anesthesiologist  Anesthesia Plan Comments:         Anesthesia Quick Evaluation

## 2020-08-13 NOTE — Interval H&P Note (Signed)
History and Physical Interval Note:  08/13/2020 10:23 AM  [name]  has presented today for surgery, with the diagnosis of LEFT URETERAL NEOPLASM.  The various methods of treatment have been discussed with the patient and family. After consideration of risks, benefits and other options for treatment, the patient has consented to  Procedure(s): CYSTOSCOPY WITH RETROGRADE PYELOGRAM/URETEROSCOPY WITH BIOPSY/URETERAL STENT PLACEMENT (Left) as a surgical intervention.  The patient's history has been reviewed, patient examined, no change in status, stable for surgery.  I have reviewed the patient's chart and labs.  Questions were answered to the patient's satisfaction.     [employer]

## 2020-08-13 NOTE — Anesthesia Procedure Notes (Signed)
Procedure Name: LMA Insertion Date/Time: 08/13/2020 10:55 AM Performed by: Silas [place], CRNA Pre-anesthesia Checklist: Patient identified, Emergency Drugs available, Suction available and Patient being monitored Patient Re-evaluated:Patient Re-evaluated prior to induction Oxygen Delivery Method: Circle system utilized Preoxygenation: Pre-oxygenation with 100% oxygen Induction Type: IV induction Ventilation: Mask ventilation without difficulty LMA: LMA inserted LMA Size: 4.0 Tube type: Oral Number of attempts: 1 Airway Equipment and Method: Oral airway Placement Confirmation: positive ETCO2 and breath sounds checked- equal and bilateral Tube secured with: Tape Dental Injury: Teeth and Oropharynx as per pre-operative assessment

## 2020-08-13 NOTE — Op Note (Signed)
Preoperative diagnosis: 1.  Left ureteral tumor 2.  Left ureteral obstruction  Postoperative diagnosis: 1.  Left ureteral tumor 2.  Left ureteral obstruction  Procedures: 1.  Cystoscopy 2.  Transurethral resection of bladder tumor (2.0 cm)  Surgeon: Pryor Curia MD  Anesthesia: General  Complications: None  EBL: Minimal  Specimen: Bladder/left ureteral tumor  Disposition of specimen: Pathology  Intraoperative findings: Inspection the bladder revealed a papillary tumor emanating from the left ureteral orifice measuring approximately 2 cm in total diameter.  Description of procedure: The patient was taken to the operating room and a general anesthetic was administered.  She was given preoperative antibiotics, placed in the dorsolithotomy position, and prepped and draped in the usual sterile fashion.  A preoperative timeout was performed.  Cystourethroscopy was then performed with a 30 degree lens.  This allowed complete systematic evaluation of the bladder mucosa.  The right ureteral orifice was in its expected anatomic location and was noted to be effluxing clear urine.  The left ureteral orifice was replaced by a large papillary tumor that was emanating from the ureteral orifice.  This measured approximately 2 cm in total diameter and extended beyond the ureteral orifice posteriorly.  No other tumors or other mucosal abnormalities were identified.  The cystoscope was then removed and replaced with a 26 French resectoscope.  Using loop bipolar resection, all visible tumor was resected.  The resection was carried down through the mucosa into the deeper layers of the bladder.  There was an area of perivesical fat identified.  Once all visible tumor was removed, the specimen was sent for permanent pathologic analysis.  Attempts to identify the left ureteral orifice were unsuccessful as it was completely obliterated.  The patient's bladder was emptied.  A 16 French Foley catheter was  inserted.  The patient tolerated the procedure well and without complications.  She was able to be awakened and transferred to the recovery unit in satisfactory condition.

## 2020-08-14 ENCOUNTER — Encounter (HOSPITAL_COMMUNITY): Payer: Self-pay | Admitting: Urology

## 2020-08-14 LAB — SURGICAL PATHOLOGY

## 2020-08-19 ENCOUNTER — Other Ambulatory Visit (HOSPITAL_COMMUNITY): Payer: Self-pay | Admitting: Urology

## 2020-08-19 DIAGNOSIS — N131 Hydronephrosis with ureteral stricture, not elsewhere classified: Secondary | ICD-10-CM

## 2020-08-21 ENCOUNTER — Ambulatory Visit: Payer: Medicare Other | Admitting: Cardiology

## 2020-08-24 ENCOUNTER — Ambulatory Visit (HOSPITAL_COMMUNITY): Payer: Medicare Other | Attending: Cardiology

## 2020-08-24 ENCOUNTER — Ambulatory Visit (INDEPENDENT_AMBULATORY_CARE_PROVIDER_SITE_OTHER): Payer: Medicare Other | Admitting: Cardiology

## 2020-08-24 ENCOUNTER — Other Ambulatory Visit: Payer: Self-pay

## 2020-08-24 ENCOUNTER — Encounter: Payer: Self-pay | Admitting: Cardiology

## 2020-08-24 VITALS — BP 150/82 | HR 90 | Ht 62.0 in | Wt 123.0 lb

## 2020-08-24 DIAGNOSIS — I5042 Chronic combined systolic (congestive) and diastolic (congestive) heart failure: Secondary | ICD-10-CM

## 2020-08-24 DIAGNOSIS — I6529 Occlusion and stenosis of unspecified carotid artery: Secondary | ICD-10-CM | POA: Diagnosis not present

## 2020-08-24 DIAGNOSIS — Z01818 Encounter for other preprocedural examination: Secondary | ICD-10-CM

## 2020-08-24 DIAGNOSIS — R0600 Dyspnea, unspecified: Secondary | ICD-10-CM

## 2020-08-24 DIAGNOSIS — Z01810 Encounter for preprocedural cardiovascular examination: Secondary | ICD-10-CM

## 2020-08-24 DIAGNOSIS — I4821 Permanent atrial fibrillation: Secondary | ICD-10-CM

## 2020-08-24 DIAGNOSIS — R072 Precordial pain: Secondary | ICD-10-CM

## 2020-08-24 DIAGNOSIS — I10 Essential (primary) hypertension: Secondary | ICD-10-CM | POA: Diagnosis not present

## 2020-08-24 LAB — ECHOCARDIOGRAM COMPLETE
Area-P 1/2: 4.12 cm2
Height: 62 in
MV M vel: 4.53 m/s
MV Peak grad: 82.1 mmHg
P 1/2 time: 502 msec
Radius: 0.45 cm
S' Lateral: 2.9 cm
Weight: 1968 oz

## 2020-08-24 MED ORDER — RIVAROXABAN 15 MG PO TABS: 15.0000 mg | ORAL_TABLET | Freq: Every day | ORAL | Status: DC

## 2020-08-24 MED ORDER — LOSARTAN POTASSIUM 25 MG PO TABS: 25.0000 mg | ORAL_TABLET | Freq: Every day | ORAL | 1 refills | Status: DC

## 2020-08-24 NOTE — Progress Notes (Addendum)
Cardiology Office Note    Date:  08/24/2020   ID:  Sara Griffin, DOB 1930-02-24, MRN [id number]  PCP:  Algis Greenhouse, MD  Cardiologist: Sara Dawley, MD EPS: None  Reason for visit: Preoperative evaluation  History of Present Illness:  Sara Griffin is a 86 y.o. female with history of chronic Atrial fibrillation chadsvasc=4 on xarelto, HTN, HLD, carotid artery stenosis, mod MR/TR, systolic CHF, CKD stage 3.Echo 11/10/17 showed mild LVH, EF 40-45%, dyskinesis of anteroseptal myocardium, moderate mitral regurgitation, severely dilated LA/RA, moderate TR, nuclear stress test 02/2016 which was negative for ischemia.   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  The patient states that she otherwise feels well, she still lives alone and is able to perform all activities of daily living, she denies any chest pain or significant dyspnea on exertion but gets tired easier than in the past.  She has occasional lower extremity edema after eating more salty food, she has not been using her Lasix as needed frequently.  She denies any palpitations dizziness presyncope or syncope.  No falls.  She has been compliant with her medications including Xarelto.  Past Medical History:  Diagnosis Date  . Cardiomyopathy ([place])    Echo 1/19: mild LVH, EF 40-45, ant-sept DK, mild AI, MAC, mod MR, severe BAE, mod TR  . Chronic atrial fibrillation ([place])   . Chronic systolic CHF (congestive heart failure) ([place])    a. dx 10/2017 but sx dating back to 08/2017.  . CKD (chronic kidney disease), stage III ([facility])    a. ? CKD - baseline Cr 0.8-1.1.  Marland Kitchen Dysrhythmia   . Hx of colonic polyps   . Hyperlipidemia   . Hypertension   . Mild carotid artery disease  ([place])    a. Duplex 2014 mild carotid plaque 0-39% BICA.  Marland Kitchen Moderate mitral regurgitation 10/2017  . Moderate tricuspid regurgitation 10/2017  . PVC's (premature ventricular contractions)   . Uterine cancer ([place])   . Varicose veins of leg with pain, bilateral     Past Surgical History:  Procedure Laterality Date  . ABDOMINAL HYSTERECTOMY    . APPENDECTOMY    . COLON SURGERY    . CYSTOSCOPY W/ URETERAL STENT PLACEMENT Left 08/13/2020   Procedure: CYSTOSCOPY WITH TRANSURETHRAL RESECTION OF BLADDER/URETERAL TUMOR;  Surgeon: Raynelle Bring, MD;  Location: [facility];  Service: Urology;  Laterality: Left;  . EYE SURGERY     cataract surgery bilat with lens implants  . PARTIAL COLECTOMY    . TONSILLECTOMY      Current Medications: Current Meds  Medication Sig  . Cholecalciferol (VITAMIN D3) 125 MCG (5000 UT) TABS Take 5,000 Units by mouth daily.   Marland Kitchen diltiazem (CARDIZEM CD) 120 MG 24 hr capsule TAKE 1 CAPSULE(120 MG) BY MOUTH DAILY  . furosemide (LASIX) 20 MG tablet Take 10 mg by mouth daily as needed for edema.   Marland Kitchen levothyroxine (SYNTHROID) 25 MCG tablet Take 25 mcg by mouth daily before breakfast.   . [DISCONTINUED] losartan (COZAAR) 25 MG tablet Take 0.5 tablets (12.5 mg total) by mouth daily.     Allergies:   Hydrocod polst-cpm polst er   Social History   Socioeconomic History  . Marital status: Married    Spouse name: Not on file  . Number of children:  5  . Years of education: Not on file  . Highest education level: Not on file  Occupational History  . Not on file  Tobacco Use  . Smoking status: Never Smoker  . Smokeless tobacco: Never Used  Vaping Use  . Vaping Use: Never used  Substance and Sexual Activity  . Alcohol use: No  . Drug use: No  . Sexual activity: Not on file  Other Topics Concern  . Not on file  Social History Narrative  . Not on file   Social Determinants of Health   Financial Resource Strain:   . Difficulty of Paying Living Expenses: Not on file   Food Insecurity:   . Worried About Charity fundraiser in the Last Year: Not on file  . Ran Out of Food in the Last Year: Not on file  Transportation Needs:   . Lack of Transportation (Medical): Not on file  . Lack of Transportation (Non-Medical): Not on file  Physical Activity:   . Days of Exercise per Week: Not on file  . Minutes of Exercise per Session: Not on file  Stress:   . Feeling of Stress : Not on file  Social Connections:   . Frequency of Communication with Friends and Family: Not on file  . Frequency of Social Gatherings with Friends and Family: Not on file  . Attends Religious Services: Not on file  . Active Member of Clubs or Organizations: Not on file  . Attends Archivist Meetings: Not on file  . Marital Status: Not on file     Family History:  The patient's family history includes Heart attack (age of onset: 76) in her son; Heart attack (age of onset: 72) in her father; Heart disease in an other family member.   ROS:   Please see the history of present illness.    ROS All other systems reviewed and are negative.   PHYSICAL EXAM:   VS:  BP (!) 150/82   Pulse 90   Ht _0  (1.575 m)   Wt 123 lb (55.8 kg)   SpO2 95%   BMI 22.50 kg/m   Physical Exam  GEN: Well nourished, well developed, in no acute distress  Neck: no JVD, carotid bruits, or masses Cardiac:RRR; no murmurs, rubs, or gallops  Respiratory: Minimal crackles at lung bases GI: soft, nontender, nondistended, + BS Ext: without cyanosis, clubbing, or edema, Good distal pulses bilaterally Neuro:  Alert and Oriented x 3 Psych: euthymic mood, full affect  Wt Readings from Last 3 Encounters:  08/24/20 123 lb (55.8 kg)  08/13/20 116 lb 3 oz (52.7 kg)  08/06/20 116 lb 3 oz (52.7 kg)    Studies/Labs Reviewed:   EKG:  EKG is Afib at 79 bpm, Q waves in the anterior leads suggestive of old anterior MI this is unchanged from prior.  This was personally reviewed.  Recent Labs: 08/06/2020: BUN  28; Creatinine, Ser 1.17; Hemoglobin 15.2; Platelets 189; Potassium 6.0; Sodium 140   Lipid Panel    Component Value Date/Time   CHOL 219 (H) 04/15/2019 0737   TRIG 103 04/15/2019 0737   HDL 83 04/15/2019 0737   CHOLHDL 2.6 04/15/2019 0737   LDLCALC 115 (H) 04/15/2019 0737    Additional studies/ records that were reviewed today include:  NST 6/9/2021Study Highlights   Nuclear stress EF: 61%.  There was no ST segment deviation noted during stress.  The left ventricular ejection fraction is normal (55-65%).  The study is normal.  This  is a low risk study.  No ischemia.   2D echo 2019 - Left ventricle: The cavity size was normal. Wall thickness was    increased in a pattern of mild LVH. Systolic function was mildly    to moderately reduced. The estimated ejection fraction was in the    range of 40% to 45%. There is dyskinesis of the anteroseptal    myocardium.  - Aortic valve: There was mild regurgitation.  - Mitral valve: Calcified annulus. There was moderate    regurgitation.  - Left atrium: The atrium was severely dilated.  - Right atrium: The atrium was severely dilated.  - Tricuspid valve: There was moderate regurgitation.   Impressions:  - Dyskinesis of the septum with overall mild to moderate LV    dysfunction (EF 40); mild LVH; AI; moderate MR; severe biatrial    enlargement; moderate TR.    ASSESSMENT:    1. Chronic combined systolic and diastolic heart failure ([place])   2. Pre-operative clearance   3. Permanent atrial fibrillation ([place])   4. Essential hypertension   5. Preoperative cardiovascular examination    PLAN:  In order of problems listed above:  Chronic combined systolic and diastolic CHF -the patient has minimal lower extremity edema and crackles on her lungs, she is advised to take Lasix 20 mg daily for next 3 days and afterwards as needed and avoid salty foods.  Most recent LVEF 40 to 45% in 2019, we will repeat an echocardiogram  today.  Chronic Atrial fibrillation on Xarelto and diltiazem rate controlled, no bleeding  HTN -blood pressure is elevated, I will increase losartan to 25 mg daily.  Preoperative evaluation for intermediate risk surgery -the patient is functional class IIb, she is fully independent able to perform all activities of daily living, she is well compensated which is minimal fluid overload secondary to increase salt in her diet, she will correct that and use Lasix next 3 days.  She has no signs of angina, her physical exam does not suggest any significant valvular abnormality.  Her EKG today is unchanged from prior.  Will perform echocardiogram today and unless she has new significant LV dysfunction or any new valvular abnormality, she will be considered low risk for intermediate risk surgery.    Addendum: I have personally reviewed her echocardiogram performed today, her overall LVEF is 50%, with persistent anteroseptal dyskinesis unchanged from prior.  She has mild left atrial and moderate right atrial dilatation, she has moderate mitral and tricuspid regurgitation.  This is reassuring, her LVEF has slightly improved.  There is no severe valvular abnormality.  Medication Adjustments/Labs and Tests Ordered: Current medicines are reviewed at length with the patient today.  Concerns regarding medicines are outlined above.  Medication changes, Labs and Tests ordered today are listed in the Patient Instructions below. Patient Instructions  Medication Instructions:   PLEASE CONTINUE TAKING XARELTO 15 MG BY MOUTH DAILY  INCREASE YOUR LOSARTAN TO 25 MG BY MOUTH DAILY  *If you need a refill on your cardiac medications before your next appointment, please call your pharmacy*  Testing/Procedures:  Your physician has requested that you have an echocardiogram. Echocardiography is a painless test that uses sound waves to create images of your heart. It provides your doctor with information about the size and  shape of your heart and how well your heart's chambers and valves are working. This procedure takes approximately one hour. There are no restrictions for this procedure.   PER [name] THIS NEEDS TO BE  DONE TODAY   Follow-Up:  IN 4 MONTHS IN THE OFFICE WITH AN EXTENDER ON DR. Francesca Oman TEAM     Signed, Sara Dawley, MD  08/24/2020 3:58 PM    [facility] [address], [place], [place]  [address] Phone: [phone]; Fax: [phone]

## 2020-08-24 NOTE — Patient Instructions (Signed)
Medication Instructions:   PLEASE CONTINUE TAKING XARELTO 15 MG BY MOUTH DAILY  INCREASE YOUR LOSARTAN TO 25 MG BY MOUTH DAILY  *If you need a refill on your cardiac medications before your next appointment, please call your pharmacy*  Testing/Procedures:  Your physician has requested that you have an echocardiogram. Echocardiography is a painless test that uses sound waves to create images of your heart. It provides your doctor with information about the size and shape of your heart and how well your heart's chambers and valves are working. This procedure takes approximately one hour. There are no restrictions for this procedure.   PER [name] THIS NEEDS TO BE DONE TODAY   Follow-Up:  IN 4 MONTHS IN THE OFFICE WITH AN EXTENDER ON DR. Francesca Oman TEAM

## 2020-08-25 ENCOUNTER — Other Ambulatory Visit (INDEPENDENT_AMBULATORY_CARE_PROVIDER_SITE_OTHER): Payer: Medicare Other | Admitting: *Deleted

## 2020-08-25 DIAGNOSIS — I5042 Chronic combined systolic (congestive) and diastolic (congestive) heart failure: Secondary | ICD-10-CM

## 2020-08-25 DIAGNOSIS — I4821 Permanent atrial fibrillation: Secondary | ICD-10-CM

## 2020-08-25 DIAGNOSIS — R0609 Other forms of dyspnea: Secondary | ICD-10-CM

## 2020-08-25 DIAGNOSIS — R0600 Dyspnea, unspecified: Secondary | ICD-10-CM

## 2020-08-25 DIAGNOSIS — R072 Precordial pain: Secondary | ICD-10-CM

## 2020-08-25 NOTE — Progress Notes (Signed)
e

## 2020-08-31 ENCOUNTER — Ambulatory Visit (HOSPITAL_COMMUNITY)
Admission: RE | Admit: 2020-08-31 | Discharge: 2020-08-31 | Disposition: A | Discharge status: Home / Self Care | Payer: Medicare Other | Source: Ambulatory Visit | Attending: Urology | Admitting: Urology

## 2020-08-31 ENCOUNTER — Ambulatory Visit (HOSPITAL_COMMUNITY): Payer: Medicare Other

## 2020-08-31 ENCOUNTER — Other Ambulatory Visit: Payer: Self-pay

## 2020-08-31 DIAGNOSIS — N131 Hydronephrosis with ureteral stricture, not elsewhere classified: Secondary | ICD-10-CM

## 2020-08-31 MED ORDER — TECHNETIUM TC 99M MERTIATIDE
5.4000 | Freq: Once | INTRAVENOUS | Status: AC | PRN
  Administered 2020-08-31: 5.4 via INTRAVENOUS

## 2020-08-31 MED ORDER — FUROSEMIDE 10 MG/ML IJ SOLN
INTRAMUSCULAR | Status: AC
  Filled 2020-08-31: qty 4

## 2020-08-31 MED ORDER — FUROSEMIDE 10 MG/ML IJ SOLN: 27.9000 mg | Freq: Once | INTRAMUSCULAR | Status: AC

## 2020-09-01 ENCOUNTER — Telehealth: Payer: Self-pay | Admitting: Cardiology

## 2020-09-01 ENCOUNTER — Other Ambulatory Visit: Payer: Self-pay | Admitting: Urology

## 2020-09-01 NOTE — Telephone Encounter (Signed)
° °  [facility] Pre-operative Risk Assessment    HEARTCARE STAFF: - Please ensure there is not already an duplicate clearance open for this procedure. - Under Visit Info/Reason for Call, type in Other and utilize the format Clearance MM/DD/YY or Clearance TBD. Do not use dashes or single digits. - If request is for dental extraction, please clarify the # of teeth to be extracted.  Request for surgical clearance:  1. What type of surgery is being performed? Left robot assisted laparoscopic Nephrocapsectomy   2. When is this surgery scheduled? 10/01/20  3. What type of clearance is required (medical clearance vs. Pharmacy clearance to hold med vs. Both)? both  4. Are there any medications that need to be held prior to surgery and how long? xerrto threes days befor  5. Practice name and name of physician performing surgery? Dr. Alinda Money  What is the office phone number? [email or web address].   What is the office fax number? [phone]  8.   Anesthesia type (None, local, MAC, general) ? [place]   Sara Griffin 09/01/2020, 12:20 PM  _________________________________________________________________   (provider comments below)

## 2020-09-02 NOTE — Telephone Encounter (Signed)
   Primary Cardiologist: Ena Dawley, MD  Chart reviewed as part of pre-operative protocol coverage. Sara Griffin was seen by [name] 08/24/20 and deemed acceptable risk for the planned procedure without further cardiovascular testing.   Per pharmacy recommendations, patient can hold xarelto 3 days prior to her upcoming nephrocapsectomy. Sara Griffin should be restarted when cleared to do so by her surgeon.  I will route this recommendation and Dr. Francesca Oman office note to the requesting party via Epic fax function and remove from pre-op pool.  Please call with questions.  Abigail Butts, PA-C 09/02/2020, 12:16 PM

## 2020-09-02 NOTE — Telephone Encounter (Signed)
Patient with diagnosis of a fib on Xarelto for anticoagulation.    Procedure: Left robot assisted laparoscopic Nephrocapsectomy  Date of procedure: 10/01/20  CHA2DS2-VASc Score = 6  This indicates a 9.7% annual risk of stroke. The patient's score is based upon: CHF History: 1 HTN History: 1 Diabetes History: 0 Stroke History: 0 Vascular Disease History: 1 Age Score: 2 Gender Score: 1  CrCl 30 mLmin Platelet count 189K  Per office protocol, patient can hold Xarelto for 3 days prior to procedure.

## 2020-09-23 NOTE — Progress Notes (Signed)
DUE TO COVID-19 ONLY ONE VISITOR IS ALLOWED TO COME WITH YOU AND STAY IN THE WAITING ROOM ONLY DURING PRE OP AND PROCEDURE DAY OF SURGERY. THE 1 VISITOR  MAY VISIT WITH YOU AFTER SURGERY IN YOUR PRIVATE ROOM DURING VISITING HOURS ONLY!  YOU NEED TO HAVE A COVID 19 TEST ON__12/03/2020 _____ @_______ , THIS TEST MUST BE DONE BEFORE SURGERY,  COVID TESTING SITE [address] [id number], IT IS ON THE RIGHT GOING OUT [place] APPROXIMATELY  2 MINUTES PAST [employer] ON THE RIGHT. ONCE YOUR COVID TEST IS COMPLETED,  PLEASE BEGIN THE QUARANTINE INSTRUCTIONS AS OUTLINED IN YOUR HANDOUT.                Sara Griffin  09/23/2020   Your procedure is scheduled on: 10/01/2020    Report to [facility] Main  Entrance   Report to admitting at    0930 AM     Call this number if you have problems the morning of surgery [phone]    Remember: Do not eat food , candy gum or mints :After Midnight. You may have clear liquids from midnight until 0830am   Drink 1/2 bottle of Magnesium citrate at 12 noon day before surgery.     CLEAR LIQUID DIET   Foods Allowed                                                                       Coffee and tea, regular and decaf                              Plain Jell-O any favor except red or purple                                            Fruit ices (not with fruit pulp)                                      Iced Popsicles                                     Carbonated beverages, regular and diet                                    Cranberry, grape and apple juices Sports drinks like Gatorade Lightly seasoned clear broth or consume(fat free) Sugar, honey syrup   _____________________________________________________________________    BRUSH YOUR TEETH MORNING OF SURGERY AND RINSE YOUR MOUTH OUT, NO CHEWING GUM CANDY OR MINTS.     Take these medicines the morning of surgery with A SIP OF WATER: Cardizem, synthroid    DO NOT TAKE ANY DIABETIC MEDICATIONS DAY OF YOUR SURGERY  You may not have any metal on your body including hair pins and              piercings  Do not wear jewelry, make-up, lotions, powders or perfumes, deodorant             Do not wear nail polish on your fingernails.  Do not shave  48 hours prior to surgery.              Men may shave face and neck.   Do not bring valuables to the hospital. [place].  Contacts, dentures or bridgework may not be worn into surgery.  Leave suitcase in the car. After surgery it may be brought to your room.     Patients discharged the day of surgery will not be allowed to drive home. IF YOU ARE HAVING SURGERY AND GOING HOME THE SAME DAY, YOU MUST HAVE AN ADULT TO DRIVE YOU HOME AND BE WITH YOU FOR 24 HOURS. YOU MAY GO HOME BY TAXI OR UBER OR ORTHERWISE, BUT AN ADULT MUST ACCOMPANY YOU HOME AND STAY WITH YOU FOR 24 HOURS.  Name and phone number of your driver:  Special Instructions: N/A              Please read over the following fact sheets you were given: _____________________________________________________________________  [facility] - Preparing for Surgery Before surgery, you can play an important role.  Because skin is not sterile, your skin needs to be as free of germs as possible.  You can reduce the number of germs on your skin by washing with CHG (chlorahexidine gluconate) soap before surgery.  CHG is an antiseptic cleaner which kills germs and bonds with the skin to continue killing germs even after washing. Please DO NOT use if you have an allergy to CHG or antibacterial soaps.  If your skin becomes reddened/irritated stop using the CHG and inform your nurse when you arrive at Short Stay. Do not shave (including legs and underarms) for at least 48 hours prior to the first CHG shower.  You may shave your face/neck. Please follow these instructions carefully:  1.   Shower with CHG Soap the night before surgery and the  morning of Surgery.  2.  If you choose to wash your hair, wash your hair first as usual with your  normal  shampoo.  3.  After you shampoo, rinse your hair and body thoroughly to remove the  shampoo.                           4.  Use CHG as you would any other liquid soap.  You can apply chg directly  to the skin and wash                       Gently with a scrungie or clean washcloth.  5.  Apply the CHG Soap to your body ONLY FROM THE NECK DOWN.   Do not use on face/ open                           Wound or open sores. Avoid contact with eyes, ears mouth and genitals (private parts).  Wash face,  Genitals (private parts) with your normal soap.             6.  Wash thoroughly, paying special attention to the area where your surgery  will be performed.  7.  Thoroughly rinse your body with warm water from the neck down.  8.  DO NOT shower/wash with your normal soap after using and rinsing off  the CHG Soap.                9.  Pat yourself dry with a clean towel.            10.  Wear clean pajamas.            11.  Place clean sheets on your bed the night of your first shower and do not  sleep with pets. Day of Surgery : Do not apply any lotions/deodorants the morning of surgery.  Please wear clean clothes to the hospital/surgery center.  FAILURE TO FOLLOW THESE INSTRUCTIONS MAY RESULT IN THE CANCELLATION OF YOUR SURGERY PATIENT SIGNATURE_________________________________  NURSE SIGNATURE__________________________________  ________________________________________________________________________

## 2020-09-25 ENCOUNTER — Encounter (HOSPITAL_COMMUNITY): Payer: Self-pay

## 2020-09-25 ENCOUNTER — Encounter (HOSPITAL_COMMUNITY)
Admission: RE | Admit: 2020-09-25 | Discharge: 2020-09-25 | Disposition: A | Discharge status: Home / Self Care | Payer: Medicare Other | Source: Ambulatory Visit | Attending: Urology | Admitting: Urology

## 2020-09-25 ENCOUNTER — Other Ambulatory Visit: Payer: Self-pay

## 2020-09-25 DIAGNOSIS — Z01818 Encounter for other preprocedural examination: Secondary | ICD-10-CM | POA: Insufficient documentation

## 2020-09-25 HISTORY — DX: Hypothyroidism, unspecified: E03.9

## 2020-09-25 HISTORY — DX: Cardiac murmur, unspecified: R01.1

## 2020-09-25 LAB — CBC
HCT: 42.9 % (ref 36.0–46.0)
Hemoglobin: 13.4 g/dL (ref 12.0–15.0)
MCH: 31.8 pg (ref 26.0–34.0)
MCHC: 31.2 g/dL (ref 30.0–36.0)
MCV: 101.9 fL — ABNORMAL HIGH (ref 80.0–100.0)
Platelets: 221 10*3/uL (ref 150–400)
RBC: 4.21 MIL/uL (ref 3.87–5.11)
RDW: 13.8 % (ref 11.5–15.5)
WBC: 7.3 10*3/uL (ref 4.0–10.5)
nRBC: 0 % (ref 0.0–0.2)

## 2020-09-25 LAB — BASIC METABOLIC PANEL
Anion gap: 9 (ref 5–15)
BUN: 26 mg/dL — ABNORMAL HIGH (ref 8–23)
CO2: 24 mmol/L (ref 22–32)
Calcium: 9 mg/dL (ref 8.9–10.3)
Chloride: 106 mmol/L (ref 98–111)
Creatinine, Ser: 1.09 mg/dL — ABNORMAL HIGH (ref 0.44–1.00)
GFR, Estimated: 49 mL/min — ABNORMAL LOW (ref >60)
Glucose, Bld: 90 mg/dL (ref 70–99)
Potassium: 5.2 mmol/L — ABNORMAL HIGH (ref 3.5–5.1)
Sodium: 139 mmol/L (ref 135–145)

## 2020-09-25 NOTE — Progress Notes (Addendum)
Anesthesia Review:  PCP: Dr [name] - [place] - LOV- 09/11/2020  Cardiologist : DR Ena Dawley  LOV 08/24/2020 Cardiac clearance - 09/01/2020   Chest x-ray : EKG :08/24/2020  Echo :08/24/2020  Stress test: 04/01/2020  Cardiac Cath :  Activity level: cannot do a flght of stairs without difficulty  Sleep Study/ CPAP :no  Fasting Blood Sugar :      / Checks Blood Sugar -- times a day:   Blood Thinner/ Instructions /Last Dose: ASA / Instructions/ Last Dose :  Xarelto- last dose on 09/27/2020 per pt and son  Patient with recent uti on penicillin by Dr [name].  Completed penicillin on 09/21/2020 per pt and son.  Son reports there has been no followup u/a done .  Informed son that nurse would call DR Borden's office and let them be aware.  [facility] and [facility] for Sara Griffin in regards to uit with penicillin and penicillin complleted on 09/21/2020 with no followup done by DR [name].  Left call back number of [phone].  B/p 143/101 pt stated she had not yet taken bp meds at preop.  Denies any chest pain, dizziness, headache or blurred vision.  BMP done 09/25/20 faxed via epic to Dr Alinda Money.

## 2020-09-25 NOTE — Anesthesia Preprocedure Evaluation (Addendum)
Anesthesia Evaluation  Patient identified by MRN, date of birth, ID band Patient awake    Reviewed: Allergy & Precautions, NPO status , Patient's Chart, lab work & pertinent test results  History of Anesthesia Complications Negative for: history of anesthetic complications  Airway Mallampati: II  TM Distance: >3 FB Neck ROM: Full    Dental  (+) Edentulous Upper, Edentulous Lower   Pulmonary neg pulmonary ROS,  09/28/2020 SARS coronavirus NEG   breath sounds clear to auscultation       Cardiovascular hypertension, Pt. on medications (-) angina+ dysrhythmias Atrial Fibrillation + Valvular Problems/Murmurs MR and AI  Rhythm:Irregular Rate:Normal  08/2020 ECHO: EF 45-50%, mildly reduced RV function, mild-mod MR, mod TR, mild AI with aortic sclerosis   03/2020 Myoview: Nuclear stress EF: 61%. no ST segment deviation noted during stress.  left ventricular ejection fraction is normal (55-65%).  The study is normal.  This is a low risk study.  No ischemia.     Neuro/Psych negative neurological ROS     GI/Hepatic negative GI ROS, Neg liver ROS,   Endo/Other  Hypothyroidism   Renal/GU Renal InsufficiencyRenal disease     Musculoskeletal   Abdominal   Peds  Hematology Xarelto: last dose sunday   Anesthesia Other Findings   Reproductive/Obstetrics                            Anesthesia Physical Anesthesia Plan  ASA: III  Anesthesia Plan: General   Post-op Pain Management:    Induction: Intravenous  PONV Risk Score and Plan: 0 and 1 and Ondansetron, Dexamethasone and Treatment may vary due to age or medical condition  Airway Management Planned: Oral ETT  Additional Equipment: None  Intra-op Plan:   Post-operative Plan: Extubation in OR  Informed Consent: I have reviewed the patients History and Physical, chart, labs and discussed the procedure including the risks, benefits and  alternatives for the proposed anesthesia with the patient or authorized representative who has indicated his/her understanding and acceptance.       Plan Discussed with: CRNA and Surgeon  Anesthesia Plan Comments: (See PAT note 09/25/2020, Konrad Felix, PA-C)       Anesthesia Quick Evaluation

## 2020-09-25 NOTE — Progress Notes (Signed)
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 Raynelle Bring.   Per cardiology preoperative risk assessment 09/02/20, "Chart reviewed as part of pre-operative protocol coverage. Sara Griffin was seen by [name] 08/24/20 and deemed acceptable risk for the planned procedure without further cardiovascular testing.  Per pharmacy recommendations, patient can hold xarelto 3 days prior to her upcoming nephrocapsectomy. Sara Griffin should be restarted when cleared to do so by her surgeon."  Elevated BP at PAT visit, pt reports she had not taken her meds today.  Will evaluate DOS.  VS: BP (!) 143/101   Pulse 96   Temp 36.6 C (Oral)   Resp 16   SpO2 99%   PROVIDERS: Algis Greenhouse, MD is PCP   Ena Dawley, MD is Cardiologist  LABS: Labs reviewed: Acceptable for surgery. (all labs ordered are listed, but only abnormal results are displayed)  Labs Reviewed  BASIC METABOLIC PANEL - Abnormal; Notable for the following components:      Result Value   Potassium 5.2 (*)    BUN 26 (*)    Creatinine, Ser 1.09 (*)    GFR, Estimated 49 (*)    All other components within normal limits  CBC - Abnormal; Notable for the following components:   MCV 101.9 (*)    All other components within normal limits  TYPE AND SCREEN     IMAGES:   EKG: 08/24/2020 Rate 79 bpm  Atrial fibrillation  Q waves in anterior leads, old anterior MIR No change   CV: Echo 08/24/2020 IMPRESSIONS    1. Left ventricular ejection fraction, by estimation, is 45 to 50%.  The  left ventricle has mildly decreased function. The left ventricle  demonstrates regional wall motion abnormalities (see scoring  diagram/findings for description). Left ventricular  diastolic function could not be evaluated. There is dyskinesis of the left  ventricular, entire anteroseptal wall.  2. Right ventricular systolic function is mildly reduced. The right  ventricular size is mildly enlarged. There is mildly elevated pulmonary  artery systolic pressure.  3. Left atrial size was moderately dilated.  4. Right atrial size was severely dilated.  5. The mitral valve is normal in structure. Mild to moderate mitral valve  regurgitation. No evidence of mitral stenosis. Moderate mitral annular  calcification.  6. Tricuspid valve regurgitation is moderate.  7. The aortic valve is tricuspid. There is moderate calcification of the  aortic valve. There is moderate thickening of the aortic valve. Aortic  valve regurgitation is mild. Mild to moderate aortic valve  sclerosis/calcification is present, without any  evidence of aortic stenosis.  8. The inferior vena cava is dilated in size with >50% respiratory  variability, suggesting right atrial pressure of 8 mmHg.   Comparison(s): Prior images reviewed side by side. Changes from prior  study are noted. EF slightly improved compared to prior, with unchanged  dyskinesis of the anteroseptum.   Myocardial Perfusion 04/01/2020  Nuclear stress EF: 61%.  There was no ST segment deviation noted during stress.  The left ventricular ejection fraction is normal (  55-65%).  The study is normal.  This is a low risk study.  No ischemia.  Past Medical History:  Diagnosis Date  . Cardiomyopathy ([place])    Echo 1/19: mild LVH, EF 40-45, ant-sept DK, mild AI, MAC, mod MR, severe BAE, mod TR  . Chronic atrial fibrillation ([place])   . Chronic systolic CHF (congestive heart failure) ([place])    a. dx 10/2017 but sx dating back to 08/2017.  . CKD  (chronic kidney disease), stage III ([facility])    a. ? CKD - baseline Cr 0.8-1.1.  Marland Kitchen Dysrhythmia   . Heart murmur   . Hx of colonic polyps   . Hyperlipidemia   . Hypertension   . Hypothyroidism   . Mild carotid artery disease ([place])    a. Duplex 2014 mild carotid plaque 0-39% BICA.  Marland Kitchen Moderate mitral regurgitation 10/2017  . Moderate tricuspid regurgitation 10/2017  . PVC's (premature ventricular contractions)   . Uterine cancer ([place])   . Varicose veins of leg with pain, bilateral     Past Surgical History:  Procedure Laterality Date  . ABDOMINAL HYSTERECTOMY    . ABDOMINAL HYSTERECTOMY    . APPENDECTOMY    . COLON SURGERY    . CYSTOSCOPY W/ URETERAL STENT PLACEMENT Left 08/13/2020   Procedure: CYSTOSCOPY WITH TRANSURETHRAL RESECTION OF BLADDER/URETERAL TUMOR;  Surgeon: Raynelle Bring, MD;  Location: [facility];  Service: Urology;  Laterality: Left;  . EYE SURGERY     cataract surgery bilat with lens implants  . PARTIAL COLECTOMY    . TONSILLECTOMY      MEDICATIONS: . Cholecalciferol (VITAMIN D3) 125 MCG (5000 UT) TABS  . diltiazem (CARDIZEM CD) 120 MG 24 hr capsule  . furosemide (LASIX) 20 MG tablet  . levothyroxine (SYNTHROID) 25 MCG tablet  . losartan (COZAAR) 25 MG tablet  . penicillin v potassium (VEETID) 500 MG tablet  . Rivaroxaban (XARELTO) 15 MG TABS tablet   No current facility-administered medications for this encounter.     Konrad Felix, PA-C [facility] [phone]

## 2020-09-28 ENCOUNTER — Other Ambulatory Visit (HOSPITAL_COMMUNITY)
Admission: RE | Admit: 2020-09-28 | Discharge: 2020-09-28 | Disposition: A | Discharge status: Home / Self Care | Payer: Medicare Other | Source: Ambulatory Visit | Attending: Urology | Admitting: Urology

## 2020-09-28 DIAGNOSIS — Z20822 Contact with and (suspected) exposure to covid-19: Secondary | ICD-10-CM | POA: Insufficient documentation

## 2020-09-28 DIAGNOSIS — Z01812 Encounter for preprocedural laboratory examination: Secondary | ICD-10-CM | POA: Insufficient documentation

## 2020-09-28 LAB — SARS CORONAVIRUS 2 (TAT 6-24 HRS): SARS Coronavirus 2: NEGATIVE

## 2020-09-30 NOTE — H&P (Signed)
: Urothelial carcinoma of the left ureter and bladder   Sara Griffin is a very pleasant 86 year old female seen recently for a large left ureteral neoplasm and left ureteral obstruction. She initially presented to the hospital in [facility] with abdominal pain symptoms. She underwent a CT angiogram presumably to look for mesenteric ischemia. Ultimately, she appeared to have a partial small-bowel obstruction that resolved with nasogastric suction and conservative therapy. Incidentally, she was noted to have very thin left renal parenchyma with hydronephrosis and various areas of hyperdensity along the ureter including a fairly long course in the mid ureter concerning for probable bulky tumor. She did not have any regional lymphadenopathy or obvious evidence of metastatic disease. She has had some minimal hematuria despite anticoagulation with Xarelto for atrial fibrillation. She states that she may have had left-sided flank pain to her 3 years ago but this gradually resolved and currently has no pain symptoms.   Her past medical history is significant for peripheral vascular disease, atrial fibrillation, stage 3 chronic kidney disease with a baseline serum creatinine of approximately 1.2, cardiomyopathy with a baseline ejection fraction of 45-50%, hyperlipidemia, hypertension, hypothyroidism, and uterine cancer status post hysterectomy many years ago.   Her past surgical history is significant for a subtotal colectomy and appendectomy as well as a hysterectomy/oophorectomy. These were both done via a midline incision extending from just above her umbilicus to her lower abdomen.    At the time of cystoscopy, she was noted to have tumor extending from her left ureteral orifice and around her orifice measuring about 2 cm in total diameter. No additional bladder tumors were identified. I could not access her left ureteral orifice despite attempts as it was completely obliterated by tumor and could not be  identified after TUR loop cutting resection of her tumor. Her left kidney is likely poorly- or non-functional based on her imaging. She returns today to review her pathology that indicated high grade, Ta urothelial carcinoma. Muscularis propria was in the specimen and was negative for malignancy.   She was seen by [name]. On review of her note, Sara Griffin underwent a nuclear stress test in June of 2021. This demonstrated no induced ischemia and was considered a low risk study. She did have an echocardiogram yesterday confirmed an ejection fraction of 45-50% that was stable from her prior echocardiogram a couple of years ago.     ALLERGIES: No Allergies    MEDICATIONS: Diltiazem Hcl 120 mg tablet  Furosemide 40 mg tablet  Levothyroxine 25 mcg capsule  Losartan Potassium 25 mg tablet  Vitamin D3  Xarelto 15 mg (42)- 20 mg (9) tablet, dose pack     GU PSH: Complex Uroflow - 08/17/2020 Cystoscopy TURBT 2-5 cm - 08/13/2020 Hysterectomy     NON-GU PSH: Colonoscopy Partial colectomy     GU PMH: Ureteral obstruction - 08/17/2020, - 07/28/2020 Ureter, left, Neoplasm of uncertain behavior - 93/06/299 Uterine Cancer, part Unspec      PMH Notes:   1) Urothelial carcinoma of the left ureter and bladder:   NON-GU PMH: Acute on chronic diastolic (congestive) heart failure Chronic kidney disease, stage 3 unspecified Localized edema Malignant neoplasm of major salivary gland, unspecified Myiasis, unspecified Other fatigue Other specified symptoms and signs involving the circulatory and respiratory systems Polyp of colon Unspecified atrial fibrillation Unspecified hearing loss, unspecified ear    FAMILY HISTORY: 4 sons - Runs in Family Heart Attack - Runs in Family Heart Disease - Runs in Family   SOCIAL HISTORY:  Marital Status: Unknown Preferred Language: English; Ethnicity: Not Hispanic Or Latino; Race: White Current Smoking Status: Patient has never smoked.   Tobacco Use  Assessment Completed: Used Tobacco in last 30 days? Drinks 1 caffeinated drink per day.    REVIEW OF SYSTEMS:    GU Review Female:   Patient denies frequent urination, hard to postpone urination, burning /pain with urination, get up at night to urinate, leakage of urine, stream starts and stops, trouble starting your stream, have to strain to urinate, and currently pregnant.  Gastrointestinal (Upper):   Patient denies nausea and vomiting.  Gastrointestinal (Lower):   Patient denies diarrhea and constipation.  Constitutional:   Patient denies fever, night sweats, weight loss, and fatigue.  Skin:   Patient denies skin rash/ lesion and itching.  Eyes:   Patient denies blurred vision and double vision.  Ears/ Nose/ Throat:   Patient denies sore throat and sinus problems.  Hematologic/Lymphatic:   Patient denies swollen glands and easy bruising.  Cardiovascular:   Patient denies leg swelling and chest pains.  Respiratory:   Patient denies cough and shortness of breath.  Endocrine:   Patient denies excessive thirst.  Musculoskeletal:   Patient denies back pain and joint pain.  Neurological:   Patient denies headaches and dizziness.  Psychologic:   Patient denies depression and anxiety.     MULTI-SYSTEM PHYSICAL EXAMINATION:    Constitutional: Well-nourished. No physical deformities. Normally developed. Good grooming.  Respiratory: No labored breathing, no use of accessory muscles. Clear bilaterally.  Cardiovascular: Normal temperature, normal extremity pulses, no swelling, no varicosities. Irregular.  Gastrointestinal: No mass, no tenderness, no rigidity, non obese abdomen. She has a well-healed lower midline scar extending just above her umbilicus.      ASSESSMENT:      ICD-10 Details  1 GU:   Left ureteral cancer - C66.2   2   Bladder Cancer overlapping sites - C67.8    PLAN:           1. 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 I have recommended that she proceed with additional staging including CT imaging of the chest to complete her metastatic evaluation. She is also scheduled undergo a renogram next week to confirm relative renal function. My impression is that her left kidney likely does not function very well based on her prior imaging. Assuming she has no evidence of metastatic disease, we reviewed options for treatment. She understands that without treatment, now knowing that she has high-grade disease, this is likely to progress and eventually developed into an incurable metastatic malignancy that could be palliated with systemic therapy. The alternative would be to consider surgical therapy with a left nephroureterectomy. She understands that we would plan to attempt to do this robotically although there would be a high risk for open surgical conversion considering her prior surgical history. We have reviewed this procedure in detail today including the potential risks such as the risks of general anesthesia, risk of bleeding, risk of infection, risk of damage to adjacent organs/structures, risk of cancer recurrence/progression, risk of further renal dysfunction, etc. We have reviewed the expected recovery process which is likely dependent on our ability to do this in a minimally invasive fashion or whether she would require open surgical conversion. After a detailed discussion with her and her son, she does wish to proceed with surgical therapy of curative intent.    We will confirm with [name]  that she is at low risk from a cardiovascular standpoint to proceed with major non-cardiac surgery. She will then be scheduled for a left robot assisted laparoscopic nephroureterectomy with a possibility of open surgical conversion. She will need to stop her Xarelto at least 48 hours preoperatively. I will plan to this to postoperative intravesical gemcitabine. She understands that she  will require ongoing surveillance follow-up including surveillance cystoscopy. We will discuss whether she might benefit from additional intravesical BCG following her surgery.

## 2020-10-01 ENCOUNTER — Encounter (HOSPITAL_COMMUNITY): Payer: Self-pay | Admitting: Urology

## 2020-10-01 ENCOUNTER — Other Ambulatory Visit: Payer: Self-pay

## 2020-10-01 ENCOUNTER — Encounter (HOSPITAL_COMMUNITY)
Admission: RE | Disposition: A | Discharge status: Home Health | Payer: Self-pay | Source: Home / Self Care | Attending: Urology

## 2020-10-01 ENCOUNTER — Inpatient Hospital Stay (HOSPITAL_COMMUNITY)
Admission: RE | Admit: 2020-10-01 | Discharge: 2020-10-03 | DRG: 656 | Disposition: A | Discharge status: Home Health | Payer: Medicare Other | Attending: Urology | Admitting: Urology

## 2020-10-01 ENCOUNTER — Inpatient Hospital Stay (HOSPITAL_COMMUNITY): Payer: Medicare Other | Admitting: Physician Assistant

## 2020-10-01 ENCOUNTER — Inpatient Hospital Stay (HOSPITAL_COMMUNITY): Payer: Medicare Other | Admitting: Registered Nurse

## 2020-10-01 DIAGNOSIS — E785 Hyperlipidemia, unspecified: Secondary | ICD-10-CM | POA: Diagnosis present

## 2020-10-01 DIAGNOSIS — I130 Hypertensive heart and chronic kidney disease with heart failure and stage 1 through stage 4 chronic kidney disease, or unspecified chronic kidney disease: Secondary | ICD-10-CM | POA: Diagnosis present

## 2020-10-01 DIAGNOSIS — Z20822 Contact with and (suspected) exposure to covid-19: Secondary | ICD-10-CM | POA: Diagnosis present

## 2020-10-01 DIAGNOSIS — N135 Crossing vessel and stricture of ureter without hydronephrosis: Secondary | ICD-10-CM | POA: Diagnosis present

## 2020-10-01 DIAGNOSIS — K660 Peritoneal adhesions (postprocedural) (postinfection): Secondary | ICD-10-CM | POA: Diagnosis present

## 2020-10-01 DIAGNOSIS — N183 Chronic kidney disease, stage 3 unspecified: Secondary | ICD-10-CM | POA: Diagnosis present

## 2020-10-01 DIAGNOSIS — I480 Paroxysmal atrial fibrillation: Secondary | ICD-10-CM | POA: Diagnosis present

## 2020-10-01 DIAGNOSIS — C662 Malignant neoplasm of left ureter: Principal | ICD-10-CM | POA: Diagnosis present

## 2020-10-01 DIAGNOSIS — I5033 Acute on chronic diastolic (congestive) heart failure: Secondary | ICD-10-CM | POA: Diagnosis present

## 2020-10-01 DIAGNOSIS — C679 Malignant neoplasm of bladder, unspecified: Secondary | ICD-10-CM | POA: Diagnosis present

## 2020-10-01 DIAGNOSIS — I739 Peripheral vascular disease, unspecified: Secondary | ICD-10-CM | POA: Diagnosis present

## 2020-10-01 HISTORY — PX: CYSTOSCOPY: SHX5120

## 2020-10-01 HISTORY — PX: ROBOT ASSITED LAPAROSCOPIC NEPHROURETERECTOMY: SHX6077

## 2020-10-01 LAB — BASIC METABOLIC PANEL
Anion gap: 11 (ref 5–15)
BUN: 29 mg/dL — ABNORMAL HIGH (ref 8–23)
CO2: 22 mmol/L (ref 22–32)
Calcium: 8.6 mg/dL — ABNORMAL LOW (ref 8.9–10.3)
Chloride: 105 mmol/L (ref 98–111)
Creatinine, Ser: 1.22 mg/dL — ABNORMAL HIGH (ref 0.44–1.00)
GFR, Estimated: 43 mL/min — ABNORMAL LOW (ref >60)
Glucose, Bld: 119 mg/dL — ABNORMAL HIGH (ref 70–99)
Potassium: 4.7 mmol/L (ref 3.5–5.1)
Sodium: 138 mmol/L (ref 135–145)

## 2020-10-01 LAB — TYPE AND SCREEN
ABO/RH(D): O POS
Antibody Screen: NEGATIVE

## 2020-10-01 LAB — ABO/RH: ABO/RH(D): O POS

## 2020-10-01 LAB — HEMOGLOBIN AND HEMATOCRIT, BLOOD
HCT: 43.6 % (ref 36.0–46.0)
Hemoglobin: 13.5 g/dL (ref 12.0–15.0)

## 2020-10-01 SURGERY — NEPHROURETERECTOMY, ROBOT-ASSISTED, LAPAROSCOPIC
Anesthesia: General | Site: Flank | Laterality: Left

## 2020-10-01 MED ORDER — FENTANYL CITRATE (PF) 100 MCG/2ML IJ SOLN
INTRAMUSCULAR | Status: DC | PRN
  Administered 2020-10-01 (×5): 50 ug via INTRAVENOUS

## 2020-10-01 MED ORDER — CHLORHEXIDINE GLUCONATE 0.12 % MT SOLN
15.0000 mL | Freq: Once | OROMUCOSAL | Status: AC
  Administered 2020-10-01: 15 mL via OROMUCOSAL

## 2020-10-01 MED ORDER — METHYLENE BLUE 0.5 % INJ SOLN
INTRAVENOUS | Status: AC
  Filled 2020-10-01: qty 10

## 2020-10-01 MED ORDER — ROCURONIUM BROMIDE 10 MG/ML (PF) SYRINGE
PREFILLED_SYRINGE | INTRAVENOUS | Status: DC | PRN
  Administered 2020-10-01: 60 mg via INTRAVENOUS
  Administered 2020-10-01 (×2): 10 mg via INTRAVENOUS

## 2020-10-01 MED ORDER — ONDANSETRON HCL 4 MG/2ML IJ SOLN: 4.0000 mg | INTRAMUSCULAR | Status: DC | PRN

## 2020-10-01 MED ORDER — SODIUM CHLORIDE 0.9 % IR SOLN
Status: DC | PRN
  Administered 2020-10-01: 1000 mL via INTRAVESICAL

## 2020-10-01 MED ORDER — SUGAMMADEX SODIUM 200 MG/2ML IV SOLN
INTRAVENOUS | Status: DC | PRN
  Administered 2020-10-01: 300 mg via INTRAVENOUS

## 2020-10-01 MED ORDER — DIPHENHYDRAMINE HCL 12.5 MG/5ML PO ELIX: 12.5000 mg | ORAL_SOLUTION | Freq: Four times a day (QID) | ORAL | Status: DC | PRN

## 2020-10-01 MED ORDER — BUPIVACAINE-EPINEPHRINE (PF) 0.25% -1:200000 IJ SOLN
INTRAMUSCULAR | Status: AC
  Filled 2020-10-01: qty 30

## 2020-10-01 MED ORDER — DEXTROSE-NACL 5-0.45 % IV SOLN: INTRAVENOUS | Status: DC

## 2020-10-01 MED ORDER — PROPOFOL 10 MG/ML IV BOLUS
INTRAVENOUS | Status: DC | PRN
  Administered 2020-10-01: 80 mg via INTRAVENOUS

## 2020-10-01 MED ORDER — CEFAZOLIN SODIUM-DEXTROSE 1-4 GM/50ML-% IV SOLN
1.0000 g | Freq: Three times a day (TID) | INTRAVENOUS | Status: AC
  Administered 2020-10-01 – 2020-10-02 (×2): 1 g via INTRAVENOUS
  Filled 2020-10-01 (×2): qty 50

## 2020-10-01 MED ORDER — FENTANYL CITRATE (PF) 100 MCG/2ML IJ SOLN
INTRAMUSCULAR | Status: AC
  Filled 2020-10-01: qty 2

## 2020-10-01 MED ORDER — TRAMADOL HCL 50 MG PO TABS
50.0000 mg | ORAL_TABLET | Freq: Four times a day (QID) | ORAL | 0 refills | Status: DC | PRN
Start: 2020-10-01 — End: 2021-01-05

## 2020-10-01 MED ORDER — BUPIVACAINE LIPOSOME 1.3 % IJ SUSP
20.0000 mL | Freq: Once | INTRAMUSCULAR | Status: AC
  Administered 2020-10-01: 20 mL
  Filled 2020-10-01: qty 20

## 2020-10-01 MED ORDER — DILTIAZEM HCL ER COATED BEADS 120 MG PO CP24
120.0000 mg | ORAL_CAPSULE | Freq: Every day | ORAL | Status: DC
  Administered 2020-10-02 – 2020-10-03 (×2): 120 mg via ORAL
  Filled 2020-10-01 (×2): qty 1

## 2020-10-01 MED ORDER — ROCURONIUM BROMIDE 10 MG/ML (PF) SYRINGE
PREFILLED_SYRINGE | INTRAVENOUS | Status: AC
  Filled 2020-10-01: qty 10

## 2020-10-01 MED ORDER — MAGNESIUM CITRATE PO SOLN
0.5000 | Freq: Once | ORAL | Status: DC
  Filled 2020-10-01: qty 296

## 2020-10-01 MED ORDER — CEFAZOLIN SODIUM-DEXTROSE 2-4 GM/100ML-% IV SOLN
2.0000 g | Freq: Once | INTRAVENOUS | Status: AC
  Administered 2020-10-01: 2 g via INTRAVENOUS
  Filled 2020-10-01: qty 100

## 2020-10-01 MED ORDER — FENTANYL CITRATE (PF) 100 MCG/2ML IJ SOLN
25.0000 ug | INTRAMUSCULAR | Status: DC | PRN
  Administered 2020-10-01 (×2): 25 ug via INTRAVENOUS
  Administered 2020-10-01: 50 ug via INTRAVENOUS

## 2020-10-01 MED ORDER — BUPIVACAINE-EPINEPHRINE 0.25% -1:200000 IJ SOLN
INTRAMUSCULAR | Status: DC | PRN
  Administered 2020-10-01: 4 mL

## 2020-10-01 MED ORDER — LIDOCAINE HCL (PF) 2 % IJ SOLN
INTRAMUSCULAR | Status: AC
  Filled 2020-10-01: qty 5

## 2020-10-01 MED ORDER — PHENYLEPHRINE HCL (PRESSORS) 10 MG/ML IV SOLN
INTRAVENOUS | Status: AC
  Filled 2020-10-01: qty 2

## 2020-10-01 MED ORDER — ONDANSETRON HCL 4 MG/2ML IJ SOLN
INTRAMUSCULAR | Status: AC
  Filled 2020-10-01: qty 2

## 2020-10-01 MED ORDER — DEXAMETHASONE SODIUM PHOSPHATE 10 MG/ML IJ SOLN
INTRAMUSCULAR | Status: DC | PRN
  Administered 2020-10-01: 8 mg via INTRAVENOUS

## 2020-10-01 MED ORDER — HYDROMORPHONE HCL 1 MG/ML IJ SOLN
0.5000 mg | INTRAMUSCULAR | Status: DC | PRN
  Administered 2020-10-01: 1 mg via INTRAVENOUS
  Administered 2020-10-02: 0.5 mg via INTRAVENOUS
  Filled 2020-10-01 (×2): qty 1

## 2020-10-01 MED ORDER — LACTATED RINGERS IR SOLN
Status: DC | PRN
  Administered 2020-10-01: 1000 mL

## 2020-10-01 MED ORDER — FENTANYL CITRATE (PF) 250 MCG/5ML IJ SOLN
INTRAMUSCULAR | Status: AC
  Filled 2020-10-01: qty 5

## 2020-10-01 MED ORDER — ONDANSETRON HCL 4 MG/2ML IJ SOLN
INTRAMUSCULAR | Status: DC | PRN
  Administered 2020-10-01: 4 mg via INTRAVENOUS

## 2020-10-01 MED ORDER — LABETALOL HCL 5 MG/ML IV SOLN
INTRAVENOUS | Status: DC | PRN
  Administered 2020-10-01 (×2): 5 mg via INTRAVENOUS

## 2020-10-01 MED ORDER — SODIUM CHLORIDE (PF) 0.9 % IJ SOLN
INTRAMUSCULAR | Status: DC | PRN
  Administered 2020-10-01: 20 mL

## 2020-10-01 MED ORDER — PROPOFOL 10 MG/ML IV BOLUS
INTRAVENOUS | Status: AC
  Filled 2020-10-01: qty 20

## 2020-10-01 MED ORDER — LEVOTHYROXINE SODIUM 25 MCG PO TABS
25.0000 ug | ORAL_TABLET | Freq: Every day | ORAL | Status: DC
  Administered 2020-10-02 – 2020-10-03 (×2): 25 ug via ORAL
  Filled 2020-10-01 (×2): qty 1

## 2020-10-01 MED ORDER — FUROSEMIDE 20 MG PO TABS: 20.0000 mg | ORAL_TABLET | Freq: Every day | ORAL | Status: DC | PRN

## 2020-10-01 MED ORDER — LIDOCAINE 2% (20 MG/ML) 5 ML SYRINGE
INTRAMUSCULAR | Status: DC | PRN
  Administered 2020-10-01: 80 mg via INTRAVENOUS

## 2020-10-01 MED ORDER — STERILE WATER FOR IRRIGATION IR SOLN
Status: DC | PRN
  Administered 2020-10-01: 30 mL

## 2020-10-01 MED ORDER — GEMCITABINE CHEMO FOR BLADDER INSTILLATION 2000 MG
2000.0000 mg | Freq: Once | INTRAVENOUS | Status: AC
  Administered 2020-10-01: 2000 mg via INTRAVESICAL
  Filled 2020-10-01: qty 52.6

## 2020-10-01 MED ORDER — DEXAMETHASONE SODIUM PHOSPHATE 10 MG/ML IJ SOLN
INTRAMUSCULAR | Status: AC
  Filled 2020-10-01: qty 1

## 2020-10-01 MED ORDER — PHENYLEPHRINE 40 MCG/ML (10ML) SYRINGE FOR IV PUSH (FOR BLOOD PRESSURE SUPPORT)
PREFILLED_SYRINGE | INTRAVENOUS | Status: DC | PRN
  Administered 2020-10-01: 80 ug via INTRAVENOUS

## 2020-10-01 MED ORDER — LOSARTAN POTASSIUM 25 MG PO TABS
25.0000 mg | ORAL_TABLET | Freq: Every evening | ORAL | Status: DC
  Administered 2020-10-02: 25 mg via ORAL
  Filled 2020-10-01: qty 1

## 2020-10-01 MED ORDER — SODIUM CHLORIDE (PF) 0.9 % IJ SOLN
INTRAMUSCULAR | Status: AC
  Filled 2020-10-01: qty 20

## 2020-10-01 MED ORDER — LABETALOL HCL 5 MG/ML IV SOLN
INTRAVENOUS | Status: AC
  Filled 2020-10-01: qty 4

## 2020-10-01 MED ORDER — DOCUSATE SODIUM 100 MG PO CAPS
100.0000 mg | ORAL_CAPSULE | Freq: Two times a day (BID) | ORAL | Status: DC
  Administered 2020-10-01 – 2020-10-03 (×4): 100 mg via ORAL
  Filled 2020-10-01 (×5): qty 1

## 2020-10-01 MED ORDER — ORAL CARE MOUTH RINSE: 15.0000 mL | Freq: Once | OROMUCOSAL | Status: AC

## 2020-10-01 MED ORDER — DIPHENHYDRAMINE HCL 50 MG/ML IJ SOLN: 12.5000 mg | Freq: Four times a day (QID) | INTRAMUSCULAR | Status: DC | PRN

## 2020-10-01 MED ORDER — LACTATED RINGERS IV SOLN: INTRAVENOUS | Status: DC

## 2020-10-01 SURGICAL SUPPLY — 97 items
ADH SKN CLS APL DERMABOND .7 (GAUZE/BANDAGES/DRESSINGS) ×4
AGENT HMST KT MTR STRL THRMB (HEMOSTASIS)
APL PRP STRL LF DISP 70% ISPRP (MISCELLANEOUS) ×2
ATTRACTOMAT 16X20 MAGNETIC DRP (DRAPES) IMPLANT
BAG LAPAROSCOPIC 12 15 PORT 16 (BASKET) ×4 IMPLANT
BAG RETRIEVAL 12/15 (BASKET) ×6
BAG RETRIEVAL 12/15MM (BASKET) ×2
BLADE EXTENDED COATED 6.5IN (ELECTRODE) IMPLANT
BLADE HEX COATED 2.75 (ELECTRODE) IMPLANT
CATH FOLEY 3WAY  5CC 18FR (CATHETERS) ×4
CATH FOLEY 3WAY 5CC 18FR (CATHETERS) ×2 IMPLANT
CHLORAPREP W/TINT 26 (MISCELLANEOUS) ×4 IMPLANT
CLIP VESOLOCK LG 6/CT PURPLE (CLIP) ×8 IMPLANT
CLIP VESOLOCK MED LG 6/CT (CLIP) ×4 IMPLANT
CLIP VESOLOCK XL 6/CT (CLIP) ×8 IMPLANT
COVER BACK TABLE 60X90IN (DRAPES) ×4 IMPLANT
COVER SURGICAL LIGHT HANDLE (MISCELLANEOUS) ×4 IMPLANT
COVER TIP SHEARS 8 DVNC (MISCELLANEOUS) ×2 IMPLANT
COVER TIP SHEARS 8MM DA VINCI (MISCELLANEOUS) ×4
COVER WAND RF STERILE (DRAPES) IMPLANT
CUTTER ECHEON FLEX ENDO 45 340 (ENDOMECHANICALS) ×4 IMPLANT
CUTTER FLEX LINEAR 45M (STAPLE) IMPLANT
DECANTER SPIKE VIAL GLASS SM (MISCELLANEOUS) ×4 IMPLANT
DERMABOND ADVANCED (GAUZE/BANDAGES/DRESSINGS) ×4
DERMABOND ADVANCED .7 DNX12 (GAUZE/BANDAGES/DRESSINGS) ×4 IMPLANT
DISSECTOR ROUND CHERRY 3/8 STR (MISCELLANEOUS) ×4 IMPLANT
DRAIN CHANNEL 15F RND FF 3/16 (WOUND CARE) ×4 IMPLANT
DRAIN PENROSE 0.5X18 (DRAIN) IMPLANT
DRAPE ARM DVNC X/XI (DISPOSABLE) ×8 IMPLANT
DRAPE COLUMN DVNC XI (DISPOSABLE) ×2 IMPLANT
DRAPE DA VINCI XI ARM (DISPOSABLE) ×16
DRAPE DA VINCI XI COLUMN (DISPOSABLE) ×4
DRAPE INCISE IOBAN 66X45 STRL (DRAPES) ×4 IMPLANT
DRAPE LAPAROSCOPIC ABDOMINAL (DRAPES) ×4 IMPLANT
DRAPE SHEET LG 3/4 BI-LAMINATE (DRAPES) ×4 IMPLANT
DRAPE WARM FLUID 44X44 (DRAPES) IMPLANT
DRSG TEGADERM 4X4.75 (GAUZE/BANDAGES/DRESSINGS) ×4 IMPLANT
ELECT PENCIL ROCKER SW 15FT (MISCELLANEOUS) ×4 IMPLANT
ELECT REM PT RETURN 15FT ADLT (MISCELLANEOUS) ×4 IMPLANT
EVACUATOR SILICONE 100CC (DRAIN) ×4 IMPLANT
GAUZE 4X4 16PLY RFD (DISPOSABLE) ×4 IMPLANT
GAUZE SPONGE 4X4 12PLY STRL (GAUZE/BANDAGES/DRESSINGS) IMPLANT
GLOVE BIO SURGEON STRL SZ 6.5 (GLOVE) ×3 IMPLANT
GLOVE BIO SURGEONS STRL SZ 6.5 (GLOVE) ×1
GLOVE BIOGEL M STRL SZ7.5 (GLOVE) ×4 IMPLANT
GOWN STRL REUS W/TWL LRG LVL3 (GOWN DISPOSABLE) ×12 IMPLANT
HEMOSTAT SURGICEL 2X3 (HEMOSTASIS) ×4 IMPLANT
HEMOSTAT SURGICEL 4X8 (HEMOSTASIS) IMPLANT
IRRIG SUCT STRYKERFLOW 2 WTIP (MISCELLANEOUS)
IRRIGATION SUCT STRKRFLW 2 WTP (MISCELLANEOUS) IMPLANT
KIT BASIN OR (CUSTOM PROCEDURE TRAY) ×4 IMPLANT
KIT TURNOVER KIT A (KITS) IMPLANT
LOOP VESSEL MAXI BLUE (MISCELLANEOUS) ×4 IMPLANT
NS IRRIG 1000ML POUR BTL (IV SOLUTION) ×4 IMPLANT
PENCIL SMOKE EVACUATOR (MISCELLANEOUS) IMPLANT
PLUG CATH AND CAP STER (CATHETERS) ×4 IMPLANT
PROTECTOR NERVE ULNAR (MISCELLANEOUS) ×8 IMPLANT
RELOAD 45 VASCULAR/THIN (ENDOMECHANICALS) IMPLANT
SCISSORS LAP 5X45 EPIX DISP (ENDOMECHANICALS) ×4 IMPLANT
SEAL CANN UNIV 5-8 DVNC XI (MISCELLANEOUS) ×10 IMPLANT
SEAL XI 5MM-8MM UNIVERSAL (MISCELLANEOUS) ×20
SET IRRIG Y TYPE TUR BLADDER L (SET/KITS/TRAYS/PACK) ×4 IMPLANT
SET TUBE SMOKE EVAC HIGH FLOW (TUBING) ×4 IMPLANT
SOLUTION ELECTROLUBE (MISCELLANEOUS) ×4 IMPLANT
SPONGE LAP 18X18 RF (DISPOSABLE) IMPLANT
SPONGE SURGIFOAM ABS GEL 100 (HEMOSTASIS) IMPLANT
STAPLE RELOAD 45 WHT (STAPLE) ×2 IMPLANT
STAPLE RELOAD 45MM WHITE (STAPLE) ×4
STAPLER VISISTAT 35W (STAPLE) ×4 IMPLANT
SURGIFLO W/THROMBIN 8M KIT (HEMOSTASIS) IMPLANT
SUT ETHILON 3 0 PS 1 (SUTURE) IMPLANT
SUT MNCRL AB 4-0 PS2 18 (SUTURE) ×8 IMPLANT
SUT PDS AB 1 CTX 36 (SUTURE) ×4 IMPLANT
SUT PDS AB 1 TP1 96 (SUTURE) IMPLANT
SUT SILK 0 (SUTURE)
SUT SILK 0 30XBRD TIE 6 (SUTURE) IMPLANT
SUT SILK 2 0 (SUTURE)
SUT SILK 2-0 30XBRD TIE 12 (SUTURE) IMPLANT
SUT V-LOC BARB 180 2/0GR6 GS22 (SUTURE)
SUT VIC AB 0 CT1 27 (SUTURE) ×4
SUT VIC AB 0 CT1 27XBRD ANTBC (SUTURE) ×2 IMPLANT
SUT VIC AB 2-0 SH 27 (SUTURE) ×4
SUT VIC AB 2-0 SH 27X BRD (SUTURE) ×2 IMPLANT
SUT VIC AB 4-0 RB1 27 (SUTURE)
SUT VIC AB 4-0 RB1 27XBRD (SUTURE) IMPLANT
SUT VICRYL 0 UR6 27IN ABS (SUTURE) ×4 IMPLANT
SUT VLOC BARB 180 ABS3/0GR12 (SUTURE) ×4
SUTURE V-LC BRB 180 2/0GR6GS22 (SUTURE) IMPLANT
SUTURE VLOC BRB 180 ABS3/0GR12 (SUTURE) ×2 IMPLANT
TAPE UMBILICAL COTTON 1/8X30 (MISCELLANEOUS) ×4 IMPLANT
TOWEL OR NON WOVEN STRL DISP B (DISPOSABLE) ×4 IMPLANT
TRAY FOLEY MTR SLVR 16FR STAT (SET/KITS/TRAYS/PACK) IMPLANT
TRAY LAPAROSCOPIC (CUSTOM PROCEDURE TRAY) ×4 IMPLANT
TROCAR XCEL 12X100 BLDLESS (ENDOMECHANICALS) ×4 IMPLANT
TUBING CONNECTING 10 (TUBING) ×3 IMPLANT
TUBING CONNECTING 10' (TUBING) ×1
WATER STERILE IRR 1000ML POUR (IV SOLUTION) ×4 IMPLANT

## 2020-10-01 NOTE — Anesthesia Postprocedure Evaluation (Signed)
Anesthesia Post Note  Patient: [name]  Procedure(s) Performed: XI ROBOT ASSITED LAPAROSCOPIC NEPHROURETERECTOMY (Left Flank) CYSTOSCOPY     Patient location during evaluation: PACU Anesthesia Type: General Level of consciousness: sedated, patient cooperative and oriented Pain management: pain level controlled Vital Signs Assessment: post-procedure vital signs reviewed and stable Respiratory status: spontaneous breathing, nonlabored ventilation and respiratory function stable Cardiovascular status: blood pressure returned to baseline and stable Postop Assessment: no apparent nausea or vomiting Anesthetic complications: no   No complications documented.  Last Vitals:  Vitals:   10/01/20 1730 10/01/20 1745  BP: 140/88 (!) 147/101  Pulse: 85 93  Resp: 12 17  Temp:    SpO2: 94% 95%    Last Pain:  Vitals:   10/01/20 1745  TempSrc:   PainSc: Asleep                 Allure Greaser,E. Bethannie Iglehart

## 2020-10-01 NOTE — Op Note (Signed)
Preoperative diagnosis: Urothelial carcinoma of the left ureter  Postoperative diagnosis: Urothelial carcinoma of the left ureter  Procedure:  1. Left robotic-assisted laparoscopic nephroureterectomy 2. Laparoscopic adhesiolysis 3. Intravesical instillation of chemotherapy in PACU  Surgeon: Pryor Curia. M.D.   Assistant(s): Debbrah Alar, PA-C  Anesthesia: General   Complications: None  EBL: 100  IVF: 1800 mL crystalloid   Specimens:  1. Left kidney, ureter and bladder cuff  Disposition of specimens: Pathology   Drains:  1. # 15 Blake pelvic drain 2. 18 Fr Foley catheter  Indication:   Sara Griffin is a 86 y.o. patient with upper tract urothelial carcinoma of the left ureter. After a thorough review of the management options, she elected to proceed with surgical treatment and the above procedure. We have discussed the potential benefits and risks of the procedure, side effects of the proposed treatment, the likelihood of the patient achieving the goals of the procedure, and any potential problems that might occur during the procedure or recuperation. Informed consent has been obtained.   Description of procedure:   The patient was taken to the operating room and a general anesthetic was administered. The patient was given preoperative antibiotics, placed in the left modified flank position with care to pad all potential pressure points, and prepped and draped in the usual sterile fashion. Next a preoperative timeout was performed.   A site was selected in the upper midline for the assistant port. This was placed using a standard open Hassan technique which allowed entry into the peritoneal cavity under direct vision and without difficulty. A 12 mm port was placed and a pneumoperitoneum established. The camera was then used to inspect the abdomen and there was no evidence of any intra-abdominal injuries. There were extensive intra-abdominal adhesions from her prior  surgeries. The remaining abdominal ports were then placed. 8 mm robotic ports were placed in the left upper quadrant, left lower quadrant, and far left lateral abdominal wall.  Using laparoscopic scissors, these omental adhesions were carefully taken down sharply off the abdominal wall to allow placement of the camera port and for subsequent port placement later in the case.  This portion of the procedure took approximately 35-40 minutes (about 25% of the total operative time). An 8 mm port was placed to the left of the umbilicus for the camera port. All ports were placed under direct vision without difficulty.   Utilizing the cautery scissors, the white line of Toldt was incised allowing the colon to be mobilized medially and the plane between the mesocolon and the anterior layer of Gerota's fascia to be developed and the kidney to be exposed.  The renal pelvis was extremely dilated consistent with her known ureteral obstruction. The dilated ureter and gonadal vein were identified inferiorly and the ureter was lifted anteriorly off the psoas muscle. Dissection proceeded superiorly along the gonadal vein until it entered the inferior vena cava.  There were two renal arteries were identified posteriorly to the renal vein.  The renal arteries were divided between 10 mm Weck clips.  The renal vein was ligated and divided with a 45 mm Eschelon stapler.  Gerota's fascia was then intentionally entered superiorly to spare the adrenal gland and the hepatorenal ligaments were divided. The lateral attachments to the kidney were then divided allowing the kidney to be freely mobile. Dissection then proceeded inferiorly along the ureter toward the pelvis. The gonadal vein had been ligated superiorly at its insertion into the IVC and was again ligated with  Weck clips distally and divided. The ureter was dissected free down the the common iliac vessels.   At this point attention returned to the renal fossa. Hemostasis was  ensured.  Attention then focused on the pelvic dissection. The robotic cart was undocked. An additional 8 mm robotic port was placed in the lower right abdomen and the cart was redocked after the patient was placed in Trendelenburg. The ureter was further dissected freely into the pelvis after the overlying peritoneum was incised and the small vessels feeding the ureter was ligated with bipolar energy. A Weck clip was placed on the ureter distally to avoid any risk of tumor spillage. The detrusor muscle fibers were divided and the mucosa could be visualized. A 3-0 V-lock suture was secured at the lateral side of the margin of resection in the bladder. The mucosa was then entered and the ureter and a surrounding bladder cuff were removed. The cystotomy was then closed with the v-lock suture in a running fashion with a second imbricating layer as well. The bladder was then filled with saline and there was no evidence for urine leak. A 2x3 cm piece of Surgicel was placed in the pelvis near the bladder.  A # 15 Blake drain was then brought through the lateral lower port site and positioned in the perivesical space. The specimen was placed in an Endocatch II bag and later removed through a lower midline incision extending from the robotic port site in that area. The 12 mm upper midline incision was closed with a 0-vicryl suture laparoscopically and the camera port site was closed with a figure of eight 0- vicryl suture. The lower midline extraction site was closed with two running # 1 PDS sutures. All other laparoscopic/robotic ports were removed under direct vision and the pneumoperitoneum let down with inspection of the operative field performed and hemostasis again confirmed. All incision sites were then injected with local anesthetic and reapproximated at the skin level with 4-0 monocryl subcuticular closures. Dermabond was applied to the skin. The patient tolerated the procedure well and without complications. The  patient was able to be extubated and transferred to the recovery unit in satisfactory condition.   2000 mg of intravesical Gemcitabine was instilled into the bladder in the PACU and left indwelling for one hour.   Pryor Curia MD

## 2020-10-01 NOTE — Discharge Instructions (Signed)
1.  Activity:  You are encouraged to ambulate frequently (about every hour during waking hours) to help prevent blood clots from forming in your legs or lungs.  However, you should not engage in any heavy lifting (> 10-15 lbs), strenuous activity, or straining. 2. Diet: You should advance your diet as instructed by your physician.  It will be normal to have some bloating, nausea, and abdominal discomfort intermittently. 3. Prescriptions:  You will be provided a prescription for pain medication to take as needed.  If your pain is not severe enough to require the prescription pain medication, you may take extra strength Tylenol instead which will have less side effects.  You should also take a prescribed stool softener to avoid straining with bowel movements as the prescription pain medication may constipate you. 4. Incisions: You may remove your dressing bandages 48 hours after surgery if not removed in the hospital.  You will either have some small staples or special tissue glue at each of the incision sites. Once the bandages are removed (if present), the incisions may stay open to air.  You may start showering (but not soaking or bathing in water) the 2nd day after surgery and the incisions simply need to be patted dry after the shower.  No additional care is needed. 5. What to call us about: You should call the office [phone]) if you develop fever > 101 or develop persistent vomiting.   You may resume aspirin, advil, aleve, vitamins, and supplements 7 days after surgery.  Resume Xarelto when advised by Dr. Alinda Money.

## 2020-10-01 NOTE — Anesthesia Procedure Notes (Signed)
Procedure Name: Intubation Date/Time: 10/01/2020 11:40 AM Performed by: Victoriano Lain, CRNA Pre-anesthesia Checklist: Patient identified, Emergency Drugs available, Patient being monitored and Timeout performed Patient Re-evaluated:Patient Re-evaluated prior to induction Oxygen Delivery Method: Circle system utilized Preoxygenation: Pre-oxygenation with 100% oxygen Induction Type: IV induction Ventilation: Mask ventilation without difficulty Laryngoscope Size: Mac and 3 Grade View: Grade I Tube type: Oral Tube size: 7.0 mm Number of attempts: 1 Airway Equipment and Method: Stylet Placement Confirmation: ETT inserted through vocal cords under direct vision,  positive ETCO2 and breath sounds checked- equal and bilateral Secured at: 21 cm Tube secured with: Tape Dental Injury: Teeth and Oropharynx as per pre-operative assessment

## 2020-10-01 NOTE — Transfer of Care (Signed)
Immediate Anesthesia Transfer of Care Note  Patient: Sara Griffin  Procedure(s) Performed: XI ROBOT ASSITED LAPAROSCOPIC NEPHROURETERECTOMY (Left Flank) CYSTOSCOPY  Patient Location: PACU  Anesthesia Type:General  Level of Consciousness: awake, drowsy and responds to stimulation  Airway & Oxygen Therapy: Patient Spontanous Breathing and Patient connected to face mask oxygen  Post-op Assessment: Report given to RN, Post -op Vital signs reviewed and stable and Patient moving all extremities  Post vital signs: Reviewed and stable  Last Vitals:  Vitals Value Taken Time  BP 132/99 10/01/20 1550  Temp    Pulse 54 10/01/20 1556  Resp 16 10/01/20 1556  SpO2 100 % 10/01/20 1556  Vitals shown include unvalidated device data.  Last Pain:  Vitals:   10/01/20 0942  TempSrc:   PainSc: 0-No pain      Patients Stated Pain Goal: 4 (62/94/76 5465)  Complications: No complications documented.

## 2020-10-01 NOTE — Progress Notes (Signed)
Patient ID: Sara Griffin, female   DOB: Sep 30, 1930, 86 y.o.   MRN: [id number]  Post-op note  Subjective: The patient is doing well.  No complaints.  Objective: Vital signs in last 24 hours: Temp:  [96.1 F (35.6 C)-98 F (36.7 C)] 98 F (36.7 C) (12/09 1948) Pulse Rate:  [38-96] 95 (12/09 1948) Resp:  [8-18] 18 (12/09 1948) BP: (132-191)/(81-110) 149/89 (12/09 1948) SpO2:  [94 %-100 %] 99 % (12/09 1948) Weight:  [53.3 kg] 53.3 kg (12/09 0937)  Intake/Output from previous day: No intake/output data recorded. Intake/Output this shift: Total I/O In: -  Out: 80 [Drains:80]  Physical Exam:  General: Alert and oriented. Abdomen: Soft, Nondistended. Incisions: Clean and dry. GU: Urine clear.  Lab Results: Recent Labs    10/01/20 1617  HGB 13.5  HCT 43.6    Assessment/Plan: POD#0   1) Continue to monitor, ambulate, IS   Sara Griffin. MD   LOS: 0 days   Dutch Gray 10/01/2020, 9:03 PM

## 2020-10-02 ENCOUNTER — Encounter (HOSPITAL_COMMUNITY): Payer: Self-pay | Admitting: Urology

## 2020-10-02 LAB — CREATININE, FLUID (PLEURAL, PERITONEAL, JP DRAINAGE)
Creat, Fluid: 1 mg/dL
Creat, Fluid: 1 mg/dL

## 2020-10-02 LAB — BASIC METABOLIC PANEL
Anion gap: 9 (ref 5–15)
BUN: 29 mg/dL — ABNORMAL HIGH (ref 8–23)
CO2: 24 mmol/L (ref 22–32)
Calcium: 7.9 mg/dL — ABNORMAL LOW (ref 8.9–10.3)
Chloride: 102 mmol/L (ref 98–111)
Creatinine, Ser: 1.24 mg/dL — ABNORMAL HIGH (ref 0.44–1.00)
GFR, Estimated: 42 mL/min — ABNORMAL LOW (ref >60)
Glucose, Bld: 224 mg/dL — ABNORMAL HIGH (ref 70–99)
Potassium: 4.4 mmol/L (ref 3.5–5.1)
Sodium: 135 mmol/L (ref 135–145)

## 2020-10-02 LAB — HEMOGLOBIN AND HEMATOCRIT, BLOOD
HCT: 39.6 % (ref 36.0–46.0)
Hemoglobin: 12.4 g/dL (ref 12.0–15.0)

## 2020-10-02 MED ORDER — BISACODYL 10 MG RE SUPP: 10.0000 mg | Freq: Once | RECTAL | Status: DC

## 2020-10-02 MED ORDER — DEXTROSE-NACL 5-0.45 % IV SOLN: INTRAVENOUS | Status: DC

## 2020-10-02 MED ORDER — CHLORHEXIDINE GLUCONATE CLOTH 2 % EX PADS
6.0000 | MEDICATED_PAD | Freq: Every day | CUTANEOUS | Status: DC
  Administered 2020-10-02 – 2020-10-03 (×2): 6 via TOPICAL

## 2020-10-02 MED ORDER — TRAMADOL HCL 50 MG PO TABS
50.0000 mg | ORAL_TABLET | Freq: Four times a day (QID) | ORAL | Status: DC | PRN
  Administered 2020-10-02 – 2020-10-03 (×3): 50 mg via ORAL
  Filled 2020-10-02 (×3): qty 1

## 2020-10-02 MED ORDER — ENSURE ENLIVE PO LIQD
237.0000 mL | Freq: Three times a day (TID) | ORAL | Status: DC
  Administered 2020-10-03: 08:00:00 237 mL via ORAL

## 2020-10-02 NOTE — Progress Notes (Signed)
Patient ID: Sara Griffin, female   DOB: 08/28/1930, 86 y.o.   MRN: [id number]  Pt doing relatively well.  Worked with PT today and HHPT recommended and has been ordered.  No BM or flatus.  Tolerating clears.  She felt poorly this afternoon but now feeling much better.  Did better with tramadol for pain.  Drain Cr has not yet been sent.  Gen: Alert and oriented,  IVF not running Abd: mild distention, positive BS  Plan: - Keep IVF running overnight until eating better - Reordered drain Cr, plan to remove drain in AM if serum - If tolerating breakfast and feeling better, ok to d/c home tomorrow with Foley - I updated her son, Karie Soda

## 2020-10-02 NOTE — Progress Notes (Signed)
Patient ID: Sara Griffin, female   DOB: 04/05/1930, 86 y.o.   MRN: [id number]  1 Day Post-Op Subjective: Doing well.  Did not ambulate overnight.    Minimal pain.  Objective: Vital signs in last 24 hours: Temp:  [96.1 F (35.6 C)-98.3 F (36.8 C)] 98.2 F (36.8 C) (12/10 1020) Pulse Rate:  [38-97] 94 (12/10 1020) Resp:  [8-18] 17 (12/10 1020) BP: (119-183)/(80-110) 119/89 (12/10 1020) SpO2:  [88 %-100 %] 88 % (12/10 1020)  Intake/Output from previous day: 12/09 0701 - 12/10 0700 In: 3060.1 [I.V.:2910.1; IV Piggyback:150] Out: 1425 [Urine:1200; Drains:125; Blood:100] Intake/Output this shift: No intake/output data recorded.  Physical Exam:  General: Alert and oriented Abd: Incisions healing well Urine: clear  Lab Results: Recent Labs    10/01/20 1617 10/02/20 0443  HGB 13.5 12.4  HCT 43.6 39.6   BMET Recent Labs    10/01/20 1617 10/02/20 0443  NA 138 135  K 4.7 4.4  CL 105 102  CO2 22 24  GLUCOSE 119* 224*  BUN 29* 29*  CREATININE 1.22* 1.24*  CALCIUM 8.6* 7.9*     Studies/Results: No results found.  Assessment/Plan: POD # 1 s/p left RAL nephroureterectomy - Ambulate, PT - Advance diet - Oral pain medication - Continue Foley - Check drain Cr   LOS: 1 day   Dutch Gray 10/02/2020, 12:35 PM

## 2020-10-02 NOTE — Evaluation (Signed)
Physical Therapy Evaluation Patient Details Name: Sara Griffin MRN: [id number] DOB: [birth year] Today's Date: 10/02/2020   History of Present Illness  86 year old female seen recently for a large left ureteral neoplasm and left ureteral obstruction and s/p Left robotic-assisted laparoscopic nephroureterectomy 10/01/20  Clinical Impression  Pt admitted with above diagnosis.  Pt currently with functional limitations due to the deficits listed below (see PT Problem List). Pt will benefit from skilled PT to increase their independence and safety with mobility to allow discharge to the venue listed below.   Pt assisted with ambulating in hallway and encouraged pt to ambulate with nursing staff.  Pt anticipates d/c home with family assist (states she has five sons).      Follow Up Recommendations Home health PT    Equipment Recommendations  None recommended by PT    Recommendations for Other Services       Precautions / Restrictions Precautions Precautions: Fall Precaution Comments: Left JP drain      Mobility  Bed Mobility Overal bed mobility: Needs Assistance Bed Mobility: Rolling;Sidelying to Sit Rolling: Min assist Sidelying to sit: Min assist       General bed mobility comments: slight assist for trunk upright, cues for log roll as pt reports pain with attempting bed mobility    Transfers Overall transfer level: Needs assistance Equipment used: Rolling walker (2 wheeled) Transfers: Sit to/from Stand Sit to Stand: Min guard         General transfer comment: verbal cues for hand placement, min/guard for safety  Ambulation/Gait Ambulation/Gait assistance: Min guard Gait Distance (Feet): 80 Feet Assistive device: Rolling walker (2 wheeled) Gait Pattern/deviations: Step-through pattern;Decreased stride length     General Gait Details: verbal cues for RW positioning and posture, HR 126 afib on monitor  Stairs            Wheelchair Mobility    Modified  Rankin (Stroke Patients Only)       Balance Overall balance assessment: Needs assistance         Standing balance support: Bilateral upper extremity supported Standing balance-Leahy Scale: Poor Standing balance comment: reliant on UE support                             Pertinent Vitals/Pain Pain Assessment: 0-10 Pain Score: 4  Pain Location: "left side" Pain Descriptors / Indicators: Sore;Tender Pain Intervention(s): Monitored during session;Repositioned;Premedicated before session    Home Living Family/patient expects to be discharged to:: Private residence Living Arrangements: Alone   Type of Home: House Home Access: Stairs to enter   Technical brewer of Steps: 2 Home Layout: One level Home Equipment: Environmental consultant - 2 wheels      Prior Function Level of Independence: Independent               Hand Dominance        Extremity/Trunk Assessment        Lower Extremity Assessment Lower Extremity Assessment: Generalized weakness    Cervical / Trunk Assessment Cervical / Trunk Assessment: Normal  Communication   Communication: No difficulties  Cognition Arousal/Alertness: Awake/alert Behavior During Therapy: WFL for tasks assessed/performed Overall Cognitive Status: Within Functional Limits for tasks assessed                                        General Comments      Exercises  Assessment/Plan    PT Assessment Patient needs continued PT services  PT Problem List Decreased strength;Decreased balance;Decreased knowledge of use of DME;Decreased mobility;Decreased activity tolerance       PT Treatment Interventions DME instruction;Therapeutic exercise;Gait training;Functional mobility training;Therapeutic activities;Patient/family education;Stair training    PT Goals (Current goals can be found in the Care Plan section)  Acute Rehab PT Goals PT Goal Formulation: With patient Time For Goal Achievement:  10/16/20 Potential to Achieve Goals: Good    Frequency Min 3X/week   Barriers to discharge        Co-evaluation               AM-PAC PT "6 Clicks" Mobility  Outcome Measure Help needed turning from your back to your side while in a flat bed without using bedrails?: A Little Help needed moving from lying on your back to sitting on the side of a flat bed without using bedrails?: A Little Help needed moving to and from a bed to a chair (including a wheelchair)?: A Little Help needed standing up from a chair using your arms (e.g., wheelchair or bedside chair)?: A Little Help needed to walk in hospital room?: A Little Help needed climbing 3-5 steps with a railing? : A Little 6 Click Score: 18    End of Session Equipment Utilized During Treatment: Gait belt Activity Tolerance: Patient tolerated treatment well Patient left: with call bell/phone within reach;in chair;with chair alarm set Nurse Communication: Mobility status PT Visit Diagnosis: Other abnormalities of gait and mobility (R26.89)    Time: 2549-8264 PT Time Calculation (min) (ACUTE ONLY): 19 min   Charges:   PT Evaluation $PT Eval Low Complexity: 1 Low        Kati PT, DPT Acute Rehabilitation Services Pager: [phone] Office: [phone]  York Ram E 10/02/2020, 12:21 PM

## 2020-10-02 NOTE — Plan of Care (Signed)
  Problem: Education: Goal: Knowledge of General Education information will improve Description: Including pain rating scale, medication(s)/side effects and non-pharmacologic comfort measures Outcome: Progressing   Problem: Health Behavior/Discharge Planning: Goal: Ability to manage health-related needs will improve Outcome: Progressing   Problem: Clinical Measurements: Goal: Will remain free from infection Outcome: Progressing   Problem: Clinical Measurements: Goal: Respiratory complications will improve Outcome: Progressing   Problem: Elimination: Goal: Will not experience complications related to bowel motility Outcome: Progressing   Problem: Skin Integrity: Goal: Risk for impaired skin integrity will decrease Outcome: Progressing   Problem: Urinary Elimination: Goal: Signs and symptoms of infection will decrease Outcome: Progressing

## 2020-10-03 LAB — BASIC METABOLIC PANEL
Anion gap: 9 (ref 5–15)
BUN: 26 mg/dL — ABNORMAL HIGH (ref 8–23)
CO2: 24 mmol/L (ref 22–32)
Calcium: 8.5 mg/dL — ABNORMAL LOW (ref 8.9–10.3)
Chloride: 104 mmol/L (ref 98–111)
Creatinine, Ser: 1.16 mg/dL — ABNORMAL HIGH (ref 0.44–1.00)
GFR, Estimated: 45 mL/min — ABNORMAL LOW (ref >60)
Glucose, Bld: 92 mg/dL (ref 70–99)
Potassium: 5.9 mmol/L — ABNORMAL HIGH (ref 3.5–5.1)
Sodium: 137 mmol/L (ref 135–145)

## 2020-10-03 LAB — HEMOGLOBIN AND HEMATOCRIT, BLOOD
HCT: 41.2 % (ref 36.0–46.0)
Hemoglobin: 12.9 g/dL (ref 12.0–15.0)

## 2020-10-03 LAB — POTASSIUM: Potassium: 4.9 mmol/L (ref 3.5–5.1)

## 2020-10-03 NOTE — TOC Progression Note (Signed)
Transition of Care [facility]) - Progression Note    Patient Details  Name: Sara Griffin MRN: [id number] Date of Birth: Aug 26, 1930  [facility] CM/SW Contact  Joaquin Courts, RN Phone Number: 10/03/2020, 1:55 PM  Clinical Narrative:    CM spoke with patient who selects [facility] health, referral for [place] services faxed.   Expected Discharge Plan: [place] Barriers to Discharge: No Barriers Identified  Expected Discharge Plan and Services Expected Discharge Plan: [place]   Discharge Planning Services: CM Consult Post Acute Care Choice: [place] arrangements for the past 2 months: Single Family Home Expected Discharge Date: 10/03/20                         HH Arranged: PT HH Agency: [place] Date [place]: 10/03/20 Time [place]: 1350 Representative spoke with at [place]: on-call RN   Social Determinants of Health ([place]) Interventions    Readmission Risk Interventions No flowsheet data found.

## 2020-10-03 NOTE — Progress Notes (Addendum)
Urology Inpatient Progress Report  Procedure(s): XI ROBOT ASSITED LAPAROSCOPIC NEPHROURETERECTOMY CYSTOSCOPY  2 Days Post-Op   Intv/Subj: Pt has ambulated and is tolerating clears. Pain controlled with oral pain meds.   Active Problems:   Ureteral cancer, left (HCC)  Current Facility-Administered Medications  Medication Dose Route Frequency Provider Last Rate Last Admin  . bisacodyl (DULCOLAX) suppository 10 mg  10 mg Rectal Once Raynelle Bring, MD      . Chlorhexidine Gluconate Cloth 2 % PADS 6 each  6 each Topical Daily Raynelle Bring, MD   6 each at 10/02/20 1022  . dextrose 5 %-0.45 % sodium chloride infusion   Intravenous Continuous Raynelle Bring, MD   Stopped at 10/02/20 1022  . diltiazem (CARDIZEM CD) 24 hr capsule 120 mg  120 mg Oral Daily Raynelle Bring, MD   120 mg at 10/02/20 1022  . diphenhydrAMINE (BENADRYL) injection 12.5 mg  12.5 mg Intravenous Q6H PRN Raynelle Bring, MD       Or  . diphenhydrAMINE (BENADRYL) 12.5 MG/5ML elixir 12.5 mg  12.5 mg Oral Q6H PRN Raynelle Bring, MD      . docusate sodium (COLACE) capsule 100 mg  100 mg Oral BID Raynelle Bring, MD   100 mg at 10/02/20 2125  . feeding supplement (ENSURE ENLIVE / ENSURE PLUS) liquid 237 mL  237 mL Oral TID WC Raynelle Bring, MD      . furosemide (LASIX) tablet 20 mg  20 mg Oral Daily PRN Raynelle Bring, MD      . levothyroxine (SYNTHROID) tablet 25 mcg  25 mcg Oral QAC breakfast Raynelle Bring, MD   25 mcg at 10/03/20 0537  . losartan (COZAAR) tablet 25 mg  25 mg Oral QPM Raynelle Bring, MD   25 mg at 10/02/20 1801  . ondansetron (ZOFRAN) injection 4 mg  4 mg Intravenous Q4H PRN Raynelle Bring, MD      . traMADol Veatrice Bourbon) tablet 50-100 mg  50-100 mg Oral Q6H PRN Raynelle Bring, MD   50 mg at 10/03/20 0557     Objective: Vital: Vitals:   10/02/20 1020 10/02/20 1252 10/02/20 2145 10/03/20 0406  BP: 119/89 119/74 128/70 113/84  Pulse: 94 84 89 85  Resp: 17 14 16 16   Temp: 98.2 F (36.8 C) 98.8 F (37.1  C) 98.6 F (37 C) 99 F (37.2 C)  TempSrc: Oral  Oral Oral  SpO2: (!) 88% 96% 91% 93%  Weight:      Height:       I/Os: I/O last 3 completed shifts: In: 3060.1 [I.V.:2910.1; IV Piggyback:150] Out: 2485 [Urine:2200; Drains:185; Blood:100]  Physical Exam:  General: Patient is in no apparent distress Lungs: Normal respiratory effort, chest expands symmetrically. GI: Incisions are c/d/i. The abdomen is soft and appropriately ttp. JP drain with serosanguinous drainage (1000/800=1800) Foley: clear yellow urine (60/60=120) Ext: lower extremities symmetric  Lab Results: Recent Labs    10/01/20 1617 10/02/20 0443 10/03/20 0536  HGB 13.5 12.4 12.9  HCT 43.6 39.6 41.2   Recent Labs    10/01/20 1617 10/02/20 0443 10/03/20 0536  NA 138 135 137  K 4.7 4.4 5.9*  CL 105 102 104  CO2 22 24 24   GLUCOSE 119* 224* 92  BUN 29* 29* 26*  CREATININE 1.22* 1.24* 1.16*  CALCIUM 8.6* 7.9* 8.5*   No results for input(s): LABPT, INR in the last 72 hours. No results for input(s): LABURIN in the last 72 hours. Results for orders placed or performed during the hospital encounter of  09/28/20  SARS CORONAVIRUS 2 (TAT 6-24 HRS) Nasopharyngeal Nasopharyngeal Swab     Status: None   Collection Time: 09/28/20  9:27 AM   Specimen: Nasopharyngeal Swab  Result Value Ref Range Status   SARS Coronavirus 2 NEGATIVE NEGATIVE Final    Comment: (NOTE) SARS-CoV-2 target nucleic acids are NOT DETECTED.  The SARS-CoV-2 RNA is generally detectable in upper and lower respiratory specimens during the acute phase of infection. Negative results do not preclude SARS-CoV-2 infection, do not rule out co-infections with other pathogens, and should not be used as the sole basis for treatment or other patient management decisions. Negative results must be combined with clinical observations, patient history, and epidemiological information. The expected result is Negative.  Fact Sheet for  Patients: SugarRoll.be  Fact Sheet for Healthcare Providers: https://www.woods-mathews.com/  This test is not yet approved or cleared by the [place] FDA and  has been authorized for detection and/or diagnosis of SARS-CoV-2 by FDA under an Emergency Use Authorization (EUA). This EUA will remain  in effect (meaning this test can be used) for the duration of the COVID-19 declaration under Se ction 564(b)(1) of the Act, 21 U.S.C. section 360bbb-3(b)(1), unless the authorization is terminated or revoked sooner.  Performed at [facility], [place] [address] [place], [place] [address]      Assessment: 86 yo woman with left ureteral cancer POD#2 s/p left RAL nephroureterectomy doing well.  Plan: -JP drain fluid consistent with serum, removed this AM -patient would like leg bag for foley, plan to discharge with the foley -H&H stable -advance diet as tolerated -anticipate d/c today -BMP reviewed: K 5.9, review of K has varied in the past, will repeat K+   LOS: 2 days  Jacalyn Lefevre MD Urology 10/03/2020, 7:00 AM

## 2020-10-03 NOTE — Progress Notes (Signed)
Physical Therapy Treatment Patient Details Name: Sara Griffin MRN: [id number] DOB: 24-Oct-1930 Today's Date: 10/03/2020    History of Present Illness 86 year old female seen recently for a large left ureteral neoplasm and left ureteral obstruction and s/p Left robotic-assisted laparoscopic nephroureterectomy 10/01/20    PT Comments    Pt instructed in proper bed mobility to decrease soreness which she reported was very helpful. She had one slight posterior LOB while PT was fixing catheter tubing. She will have family with her at discharge.  Recommend HHPT as pt did not ambulate with RW prior to surgery and is currently reliant on it. She ambulated an entire lap with RW with HR in low 100s.   Follow Up Recommendations  Home health PT     Equipment Recommendations  None recommended by PT    Recommendations for Other Services       Precautions / Restrictions Precautions Precautions: Fall Restrictions Weight Bearing Restrictions: No    Mobility  Bed Mobility Overal bed mobility: Needs Assistance Bed Mobility: Rolling;Sidelying to Sit Rolling: Supervision Sidelying to sit: Supervision       General bed mobility comments: S with verbal cues for log rolling and sidelying > sit. Pt verbalized this helped her pain level.  Transfers Overall transfer level: Needs assistance Equipment used: Rolling walker (2 wheeled) Transfers: Sit to/from Stand Sit to Stand: Min guard         General transfer comment: cues to not pull up on RW.  Ambulation/Gait Ambulation/Gait assistance: Min guard;Supervision Gait Distance (Feet): 350 Feet Assistive device: Rolling walker (2 wheeled) Gait Pattern/deviations: Step-through pattern;Decreased stride length     General Gait Details: Improved positioning today. Good recall from previous PT session.   Stairs             Wheelchair Mobility    Modified Rankin (Stroke Patients Only)       Balance Overall balance  assessment: Needs assistance         Standing balance support: Bilateral upper extremity supported Standing balance-Leahy Scale: Poor Standing balance comment: slight posterior LOB when PT was tightening velcro strap holding catheter.                            Cognition Arousal/Alertness: Awake/alert Behavior During Therapy: WFL for tasks assessed/performed Overall Cognitive Status: Within Functional Limits for tasks assessed                                        Exercises      General Comments        Pertinent Vitals/Pain Pain Assessment: No/denies pain Pain Location: some "soreness" if she tries to sit up Pain Descriptors / Indicators: Sore Pain Intervention(s): Repositioned;Other (comment) (Educated on proper body mechanics)    Home Living                      Prior Function            PT Goals (current goals can now be found in the care plan section) Acute Rehab PT Goals Patient Stated Goal: home PT Goal Formulation: With patient Time For Goal Achievement: 10/16/20 Potential to Achieve Goals: Good Progress towards PT goals: Progressing toward goals    Frequency    Min 3X/week      PT Plan Current plan remains appropriate    Co-evaluation  AM-PAC PT "6 Clicks" Mobility   Outcome Measure  Help needed turning from your back to your side while in a flat bed without using bedrails?: A Little Help needed moving from lying on your back to sitting on the side of a flat bed without using bedrails?: A Little Help needed moving to and from a bed to a chair (including a wheelchair)?: A Little Help needed standing up from a chair using your arms (e.g., wheelchair or bedside chair)?: A Little Help needed to walk in hospital room?: A Little Help needed climbing 3-5 steps with a railing? : A Little 6 Click Score: 18    End of Session Equipment Utilized During Treatment: Gait belt Activity Tolerance:  Patient tolerated treatment well Patient left: with call bell/phone within reach;in chair;with chair alarm set Nurse Communication: Mobility status PT Visit Diagnosis: Other abnormalities of gait and mobility (R26.89)     Time: 4709-6283 PT Time Calculation (min) (ACUTE ONLY): 15 min  Charges:  $Gait Training: 8-22 mins                     [name], [name] 10/03/2020    [name] 10/03/2020, 10:15 AM

## 2020-10-03 NOTE — Progress Notes (Signed)
Patient ambulated on hall way this morning via walker, tolerated well.

## 2020-10-03 NOTE — Discharge Summary (Signed)
Date of admission: 10/01/2020  Date of discharge: 10/03/2020  Admission diagnosis: left ureteral cancer  Discharge diagnosis: left ureteral cancer  Secondary diagnoses:  Patient Active Problem List   Diagnosis Date Noted  . Ureteral cancer, left (HCC) 10/01/2020  . CKD (chronic kidney disease) stage 3, GFR 30-59 ml/min (HCC) 11/27/2017  . Moderate mitral regurgitation 11/27/2017  . Cardiomyopathy (HCC)   . Acute on chronic diastolic CHF (congestive heart failure) (HCC) 09/06/2017  . Fatigue 06/06/2017  . Lower extremity edema 02/01/2017  . Labile hypertension 02/01/2017  . Colon polyp 11/30/2016  . Hearing loss 11/30/2016  . Vertigo 11/30/2016  . Uterine cancer (HCC) 11/11/2016  . Solitary pulmonary nodule 10/26/2016  . Atrial fibrillation with rapid ventricular response (HCC)   . Congestive dilated cardiomyopathy (HCC)   . Pulmonary hypertension (HCC)   . Atrial fibrillation with RVR (HCC) 10/20/2016  . Screening for diabetes mellitus 06/22/2016  . Intertriginous candidiasis 04/14/2016  . Dermatitis 04/14/2016  . Paroxysmal atrial fibrillation (HCC) 03/02/2016  . Encounter for long-term (current) use of high-risk medication 03/02/2016  . Warfarin anticoagulation 03/02/2016  . SOB (shortness of breath) 03/02/2016  . Chest pain 03/02/2016  . Anticoagulant long-term use 01/12/2016  . Chronic atrial fibrillation (HCC) 01/27/2010  . SUPRAVENTRICULAR TACHYCARDIA 01/27/2010  . Cardiovascular symptoms 01/27/2010  . Cardiac conduction disorder 01/27/2010  . Palpitations 01/15/2010  . Hyperlipidemia 01/14/2010  . Essential hypertension 01/14/2010    Procedures performed: Procedure(s): XI ROBOT ASSITED LAPAROSCOPIC NEPHROURETERECTOMY CYSTOSCOPY  History and Physical: For full details, please see admission history and physical. Briefly, Sara Griffin is a 86 y.o. year old patient with left ureteral cancer admitted following a left robotic-assisted nephro ureterectomy.    Hospital Course: Patient tolerated the procedure well.  She was then transferred to the floor after an uneventful PACU stay.  Her hospital course was uncomplicated.  On POD#2 she had met discharge criteria: was tolerating a diet, was up and ambulating independently,  pain was well controlled, JP was removed, and was ready to for discharge.   Laboratory values:  Recent Labs    10/01/20 1617 10/02/20 0443 10/03/20 0536  HGB 13.5 12.4 12.9  HCT 43.6 39.6 41.2   Recent Labs    10/01/20 1617 10/02/20 0443 10/03/20 0536  NA 138 135 137  K 4.7 4.4 5.9*  CL 105 102 104  CO2 22 24 24  GLUCOSE 119* 224* 92  BUN 29* 29* 26*  CREATININE 1.22* 1.24* 1.16*  CALCIUM 8.6* 7.9* 8.5*   No results for input(s): LABPT, INR in the last 72 hours. No results for input(s): LABURIN in the last 72 hours. Results for orders placed or performed during the hospital encounter of 09/28/20  SARS CORONAVIRUS 2 (TAT 6-24 HRS) Nasopharyngeal Nasopharyngeal Swab     Status: None   Collection Time: 09/28/20  9:27 AM   Specimen: Nasopharyngeal Swab  Result Value Ref Range Status   SARS Coronavirus 2 NEGATIVE NEGATIVE Final    Comment: (NOTE) SARS-CoV-2 target nucleic acids are NOT DETECTED.  The SARS-CoV-2 RNA is generally detectable in upper and lower respiratory specimens during the acute phase of infection. Negative results do not preclude SARS-CoV-2 infection, do not rule out co-infections with other pathogens, and should not be used as the sole basis for treatment or other patient management decisions. Negative results must be combined with clinical observations, patient history, and epidemiological information. The expected result is Negative.  Fact Sheet for Patients: https://www.fda.gov/media/138098/download  Fact Sheet for Healthcare Providers: https://www.fda.gov/media/138095/download    This test is not yet approved or cleared by the [place] and  has been authorized for  detection and/or diagnosis of SARS-CoV-2 by FDA under an Emergency Use Authorization (EUA). This EUA will remain  in effect (meaning this test can be used) for the duration of the COVID-19 declaration under Se ction 564(b)(1) of the Act, 21 U.S.C. section 360bbb-3(b)(1), unless the authorization is terminated or revoked sooner.  Performed at [facility], [place] [address], [place], [place] [address]     Disposition: Home  Discharge instruction: The patient was instructed to be ambulatory but told to refrain from heavy lifting, strenuous activity, or driving.   Discharge medications:  Allergies as of 10/03/2020      Reactions   Hydrocod Polst-cpm Polst Er Other (See Comments)   Felt bad      Medication List    STOP taking these medications   Vitamin D3 125 MCG (5000 UT) Tabs     TAKE these medications   diltiazem 120 MG 24 hr capsule Commonly known as: CARDIZEM CD TAKE 1 CAPSULE(120 MG) BY MOUTH DAILY What changed:   how much to take  how to take this  when to take this  additional instructions   furosemide 20 MG tablet Commonly known as: LASIX Take 20 mg by mouth daily as needed for edema.   levothyroxine 25 MCG tablet Commonly known as: SYNTHROID Take 25 mcg by mouth daily before breakfast.   losartan 25 MG tablet Commonly known as: COZAAR Take 1 tablet (25 mg total) by mouth daily. What changed: additional instructions   penicillin v potassium 500 MG tablet Commonly known as: VEETID Take 500 mg by mouth 4 (four) times daily.   Rivaroxaban 15 MG Tabs tablet Commonly known as: XARELTO Take 1 tablet (15 mg total) by mouth daily with supper.   traMADol 50 MG tablet Commonly known as: Ultram Take 1-2 tablets (50-100 mg total) by mouth every 6 (six) hours as needed for moderate pain or severe pain.       Followup:   Follow-up Information    Raynelle Bring, MD On 10/06/2020.   Specialty: Urology Why: at 11:30 Contact information: [place] [address] [phone]

## 2020-10-05 LAB — SURGICAL PATHOLOGY

## 2020-10-06 ENCOUNTER — Other Ambulatory Visit: Payer: Self-pay

## 2020-10-06 ENCOUNTER — Other Ambulatory Visit: Payer: Self-pay | Admitting: Urology

## 2020-10-06 ENCOUNTER — Ambulatory Visit (HOSPITAL_COMMUNITY)
Admission: RE | Admit: 2020-10-06 | Discharge: 2020-10-06 | Disposition: A | Discharge status: Home / Self Care | Payer: Medicare Other | Source: Ambulatory Visit | Attending: Urology | Admitting: Urology

## 2020-10-06 DIAGNOSIS — C662 Malignant neoplasm of left ureter: Secondary | ICD-10-CM | POA: Insufficient documentation

## 2020-10-14 ENCOUNTER — Other Ambulatory Visit: Payer: Self-pay | Admitting: Cardiology

## 2020-10-14 ENCOUNTER — Telehealth: Payer: Self-pay | Admitting: Cardiology

## 2020-10-14 MED ORDER — DILTIAZEM HCL ER COATED BEADS 120 MG PO CP24: ORAL_CAPSULE | ORAL | 3 refills | Status: DC

## 2020-10-14 NOTE — Telephone Encounter (Signed)
*  STAT* If patient is at the pharmacy, call can be transferred to refill team.   1. Which medications need to be refilled? (please list name of each medication and dose if known) diltiazem (CARDIZEM CD) 120 MG 24 hr capsule  2. Which pharmacy/location (including street and city if local pharmacy) is medication to be sent to? [facility] [place] [address], [place]  3. Do they need a 30 day or 90 day supply? 90 day supply  Patient's son states the patient only has 4 tablets remaining.

## 2020-10-14 NOTE — Telephone Encounter (Signed)
Pt's medication was sent to pt's pharmacy as requested. Confirmation received.  °

## 2020-11-25 ENCOUNTER — Telehealth: Payer: Self-pay | Admitting: Physician Assistant

## 2020-11-25 NOTE — Telephone Encounter (Signed)
It seems to me that she is dealing with radiculopathy, I would follow-up with her primary care or Ortho surgeon and see if she needs additional work-up with imaging or if she needs physical therapy to relieve these symptoms.

## 2020-11-25 NOTE — Telephone Encounter (Signed)
Will also route this message to pts primary Cardiologist for further assistance, being Sharyn Lull is out of the office today.

## 2020-11-25 NOTE — Telephone Encounter (Signed)
Pt is calling to let Estella Husk PA-C know that for quite sometime now, every time she is laying flat to sleep at night, both of her upper extremities from the shoulder to the fingers, become numb and tingly feeling.  Pt states this mostly occurs at night when lying down to sleep.  She states at times the hands have a "prickling sensation to them." Pt states when she is awake during the day, they do not bother very much.  She states if she is sitting and trying to read a book for an extended period of time, they do sometimes get numb, but nowhere near like the way they feel at bedtime.  Pt states its both upper extremities and hands.  She states her left extremity seems to be worse than the right side, but both bother her at night.  Pt states she has no swelling to the upper extremities, and has not sustained any trauma to either extremity. She states she has no obvious discoloration to the extremities, including down to her hands and fingers.  Pt states that her hands are always freezing cold though, but not blue.  She states the hands and fingers look very red in appearance, pretty much all the time.  She states that she has even been told  by different Providers, that she may have poor circulation to both extremities, especially the hands and fingers, and also mentioned possible Raynaud's.   Pt has no other complaints like chest pain, sob, dizziness, pre-syncopal or syncopal episodes.  She states she can feel her fingers, just feels tingly and numb at times.  She states she is able to pick up objects with her hands with no difficulties.  Pt is asking for assistance with this issue. She is asking if Sharyn Lull has advisement on this, and does she think she should have further testing done, like an [place], to assess circulation to both extremities.  She is worried this correlates with her heart.  Informed the pt that I will route this message to Estella Husk PA-C and her covering CMA, to further review,  advise, and follow-up with the pt thereafter.  Pt verbalized understanding and agrees with this plan.

## 2020-11-25 NOTE — Telephone Encounter (Signed)
Patient calling to speak with Ermalinda Barrios. States she has a question she needs to ask her.

## 2020-11-25 NOTE — Telephone Encounter (Signed)
    Covering for Selinda Eon - By the description, her symptoms of numbness and tingling with positional changes sound most concerning for neuropathy/"nerve pain". Will leave in Michele's inbox so she can weigh in upon her return but would anticipate initial evaluation by her PCP. Vascular imaging could be considered if abnormalities noted on examination/variable BP in upper extremities on examination but do not suspect this given the description provided.   Signed, Erma Heritage, PA-C 11/25/2020, 10:36 AM

## 2020-11-25 NOTE — Telephone Encounter (Signed)
Spoke with the pt and endorsed to her recommendations per [name], for her to call her PCP and obtain a follow-up appt for possible radiculopathy.  Informed the pt that [name] thinks it could be radiculopathy, and she should see her PCP for this, so they could further assess and order any appropriate images, referrals, and possible PT.  Pt verbalized understanding and agrees with this plan. Pt states she will call her PCP now and schedule an appt.  Pt appreciative for all the assistance provided.

## 2020-12-17 ENCOUNTER — Telehealth: Payer: Self-pay | Admitting: Cardiology

## 2020-12-17 NOTE — Telephone Encounter (Signed)
° °  [facility] Pre-operative Risk Assessment    HEARTCARE STAFF: - Please ensure there is not already an duplicate clearance open for this procedure. - Under Visit Info/Reason for Call, type in Other and utilize the format Clearance MM/DD/YY or Clearance TBD. Do not use dashes or single digits. - If request is for dental extraction, please clarify the # of teeth to be extracted.  Request for surgical clearance:  1. What type of surgery is being performed? Left Corpal Tunnel Relief   2. When is this surgery scheduled?  3-2+22   3. What type of clearance is required (medical clearance vs. Pharmacy clearance to hold med vs. Both)? Medicine and medical  4. Are there any medications that need to be held prior to surgery and how long? Xarelto   5. Practice name and name of physician performing surgery? Dr Joya Salm  6. What is the office phone number? [phone]   7.   What is the office fax number?  [phone]  8.   Anesthesia type (None, local, MAC, general) ? Light sedation with a nerve block   Glyn Ade 12/17/2020, 3:30 PM  _________________________________________________________________   (provider comments below)

## 2020-12-17 NOTE — Telephone Encounter (Signed)
Clinical pharmacist to review Xarelto 

## 2020-12-17 NOTE — Telephone Encounter (Signed)
Patient with diagnosis of afib on Xarelto for anticoagulation.    Procedure: Left Corpal Tunnel Relief  Date of procedure: 12/23/20  CHA2DS2-VASc Score = 6  This indicates a 9.7% annual risk of stroke. The patient's score is based upon: CHF History: Yes HTN History: Yes Diabetes History: No Stroke History: No Vascular Disease History: Yes Age Score: 2 Gender Score: 1      CrCl 26.5 ml/min  Per office protocol, patient can hold Xarelto for 3 days prior to procedure.

## 2020-12-18 NOTE — Telephone Encounter (Signed)
Sara Griffin from [place] calling to follow up on clearance. She states she will be leaving and to call [phone] after 1:30pm for [facility].

## 2020-12-18 NOTE — Telephone Encounter (Signed)
   Primary Cardiologist: Ena Dawley, MD  Chart reviewed as part of pre-operative protocol coverage. Patient was contacted 12/18/2020 in reference to pre-operative risk assessment for pending surgery as outlined below.  [place] was last seen on 08/24/2020 by [name].  Since that day, [place] Benge has done well without chest pain or shortness of breath  She has been instructed to hold Xarelto for 3 days prior to the procedure and restart as soon as possible after the procedure at the surgeon's discretion based on the bleeding risk..  Therefore, based on ACC/AHA guidelines, the patient would be at acceptable risk for the planned procedure without further cardiovascular testing.   The patient was advised that if she develops new symptoms prior to surgery to contact our office to arrange for a follow-up visit, and she verbalized understanding.  I will route this recommendation to the requesting party via Epic fax function and remove from pre-op pool. Please call with questions.  [place], [place] 12/18/2020, 2:22 PM

## 2020-12-23 ENCOUNTER — Ambulatory Visit: Payer: Medicare Other | Admitting: Physician Assistant

## 2020-12-23 NOTE — Progress Notes (Signed)
Cardiology Office Note    Date:  01/05/2021   ID:  AMADEA KEAGY, Alferd Apa 1929/11/24, MRN [id number]   PCP:  Algis Greenhouse, [facility]  Cardiologist:  Ena Dawley, MD  Advanced Practice Provider:  Imogene Burn, PA-C Electrophysiologist:  None   [id number]   Chief Complaint  Patient presents with  . Follow-up    History of Present Illness:  [place] Swenson is a 87 y.o. female with history of chronic Atrial fibrillation chadsvasc=4 on xarelto, HTN, HLD, carotid artery stenosis, mod MR/TR, systolic CHF, CKD stage 3.Echo 11/10/17 showed mild LVH, EF 40-45%, dyskinesis of anteroseptal myocardium, moderate mitral regurgitation, severely dilated LA/RA, moderate TR, nuclear stress test 02/2016 which was negative for ischemia.    Had surgery for noninvasive high grade papillary urothelial carcinoma.  Last saw [name] 08/24/20 and doing well repeat echo done and showed improved LVEF 50% with persistent anteroseptal dyskinesis unchanged and was cleared for surgery.  Patient comes in accompanied by her son. Occasionally has a quick shooting pain in her chest, no tightness or pressures. Has more fatigue. Lives alone, cleans house, drives to church. Walks in her yard. No bleeding problems on Xarelto.    Past Medical History:  Diagnosis Date  . Cardiomyopathy ([place])    Echo 1/19: mild LVH, EF 40-45, ant-sept DK, mild AI, MAC, mod MR, severe BAE, mod TR  . Chronic atrial fibrillation ([place])   . Chronic systolic CHF (congestive heart failure) ([place])    a. dx 10/2017 but sx dating back to 08/2017.  . CKD (chronic kidney disease), stage III (HCC)    a. ? CKD - baseline Cr 0.8-1.1.  Marland Kitchen Dysrhythmia   . Heart murmur   . Hx of colonic polyps   . Hyperlipidemia   . Hypertension   . Hypothyroidism   . Mild carotid artery disease ([place])    a. Duplex 2014 mild carotid plaque 0-39% BICA.  Marland Kitchen Moderate mitral regurgitation 10/2017  . Moderate tricuspid  regurgitation 10/2017  . PVC's (premature ventricular contractions)   . Uterine cancer ([place])   . Varicose veins of leg with pain, bilateral     Past Surgical History:  Procedure Laterality Date  . ABDOMINAL HYSTERECTOMY    . ABDOMINAL HYSTERECTOMY    . APPENDECTOMY    . COLON SURGERY    . CYSTOSCOPY  10/01/2020   Procedure: CYSTOSCOPY;  Surgeon: Raynelle Bring, MD;  Location: [facility];  Service: Urology;;  . Consuela Mimes W/ URETERAL STENT PLACEMENT Left 08/13/2020   Procedure: CYSTOSCOPY WITH TRANSURETHRAL RESECTION OF BLADDER/URETERAL TUMOR;  Surgeon: Raynelle Bring, MD;  Location: [facility];  Service: Urology;  Laterality: Left;  . EYE SURGERY     cataract surgery bilat with lens implants  . PARTIAL COLECTOMY    . ROBOT ASSITED LAPAROSCOPIC NEPHROURETERECTOMY Left 10/01/2020   Procedure: XI ROBOT ASSITED LAPAROSCOPIC NEPHROURETERECTOMY;  Surgeon: Raynelle Bring, MD;  Location: [facility];  Service: Urology;  Laterality: Left;  . TONSILLECTOMY      Current Medications: Current Meds  Medication Sig  . diltiazem (CARDIZEM CD) 120 MG 24 hr capsule TAKE 1 CAPSULE(120 MG) BY MOUTH DAILY  . furosemide (LASIX) 20 MG tablet Take 20 mg by mouth daily as needed for edema.   Marland Kitchen levothyroxine (SYNTHROID) 25 MCG tablet Take 25 mcg by mouth daily before breakfast.   . losartan (COZAAR) 25 MG tablet Take 1 tablet (25 mg total) by mouth daily.  . Rivaroxaban (XARELTO) 15 MG  TABS tablet Take 1 tablet (15 mg total) by mouth daily with supper.     Allergies:   Hydrocod polst-cpm polst er   Social History   Socioeconomic History  . Marital status: Widowed    Spouse name: Not on file  . Number of children: 5  . Years of education: Not on file  . Highest education level: Not on file  Occupational History  . Not on file  Tobacco Use  . Smoking status: Never Smoker  . Smokeless tobacco: Never Used  Vaping Use  . Vaping Use: Never used  Substance and Sexual Activity  . Alcohol use: No  . Drug use: No   . Sexual activity: Not on file  Other Topics Concern  . Not on file  Social History Narrative  . Not on file   Social Determinants of Health   Financial Resource Strain: Not on file  Food Insecurity: Not on file  Transportation Needs: Not on file  Physical Activity: Not on file  Stress: Not on file  Social Connections: Not on file     Family History:  The patient's family history includes Heart attack (age of onset: 87) in her son; Heart attack (age of onset: 50) in her father; Heart disease in an other family member.   ROS:   Please see the history of present illness.    ROS All other systems reviewed and are negative.   PHYSICAL EXAM:   VS:  BP 124/78   Pulse 84   Ht _0  (1.549 m)   Wt 126 lb 3.2 oz (57.2 kg)   SpO2 96%   BMI 23.85 kg/m   Physical Exam  GEN: Thin, looks younger than stated age, in no acute distress  Neck: no JVD, carotid bruits, or masses Cardiac:irreg irreg 8/3/ systolic murmur LSB Respiratory:  clear to auscultation bilaterally, normal work of breathing GI: soft, nontender, nondistended, + BS Ext: without cyanosis, clubbing, or edema, Good distal pulses bilaterally Neuro:  Alert and Oriented x 3 Psych: euthymic mood, full affect  Wt Readings from Last 3 Encounters:  01/05/21 126 lb 3.2 oz (57.2 kg)  10/01/20 117 lb 9.6 oz (53.3 kg)  08/24/20 123 lb (55.8 kg)      Studies/Labs Reviewed:   EKG:  EKG is not ordered today.    Recent Labs: 09/25/2020: Platelets 221 10/03/2020: BUN 26; Creatinine, Ser 1.16; Hemoglobin 12.9; Potassium 4.9; Sodium 137   Lipid Panel    Component Value Date/Time   CHOL 219 (H) 04/15/2019 0737   TRIG 103 04/15/2019 0737   HDL 83 04/15/2019 0737   CHOLHDL 2.6 04/15/2019 0737   LDLCALC 115 (H) 04/15/2019 0737    Additional studies/ records that were reviewed today include:  2D echo 08/24/2020 IMPRESSIONS     1. Left ventricular ejection fraction, by estimation, is 45 to 50%. The  left ventricle has  mildly decreased function. The left ventricle  demonstrates regional wall motion abnormalities (see scoring  diagram/findings for description). Left ventricular  diastolic function could not be evaluated. There is dyskinesis of the left  ventricular, entire anteroseptal wall.   2. Right ventricular systolic function is mildly reduced. The right  ventricular size is mildly enlarged. There is mildly elevated pulmonary  artery systolic pressure.   3. Left atrial size was moderately dilated.   4. Right atrial size was severely dilated.   5. The mitral valve is normal in structure. Mild to moderate mitral valve  regurgitation. No evidence of mitral stenosis. Moderate  mitral annular  calcification.   6. Tricuspid valve regurgitation is moderate.   7. The aortic valve is tricuspid. There is moderate calcification of the  aortic valve. There is moderate thickening of the aortic valve. Aortic  valve regurgitation is mild. Mild to moderate aortic valve  sclerosis/calcification is present, without any  evidence of aortic stenosis.   8. The inferior vena cava is dilated in size with >50% respiratory  variability, suggesting right atrial pressure of 8 mmHg.   Comparison(s): Prior images reviewed side by side. Changes from prior  study are noted. EF slightly improved compared to prior, with unchanged  dyskinesis of the anteroseptum.   NST 6/9/2021Study Highlights    Nuclear stress EF: 61%.  There was no ST segment deviation noted during stress.  The left ventricular ejection fraction is normal (55-65%).  The study is normal.  This is a low risk study.  No ischemia.   2D echo 2019 - Left ventricle: The cavity size was normal. Wall thickness was    increased in a pattern of mild LVH. Systolic function was mildly    to moderately reduced. The estimated ejection fraction was in the    range of 40% to 45%. There is dyskinesis of the anteroseptal    myocardium.  - Aortic valve: There was  mild regurgitation.  - Mitral valve: Calcified annulus. There was moderate    regurgitation.  - Left atrium: The atrium was severely dilated.  - Right atrium: The atrium was severely dilated.  - Tricuspid valve: There was moderate regurgitation.   Impressions:  - Dyskinesis of the septum with overall mild to moderate LV    dysfunction (EF 40); mild LVH; AI; moderate MR; severe biatrial    enlargement; moderate TR.      Risk Assessment/Calculations:         ASSESSMENT:    1. Chronic combined systolic and diastolic CHF (congestive heart failure) (HCC)   2. Persistent atrial fibrillation ([place])   3. Essential hypertension   4. Hyperlipidemia, unspecified hyperlipidemia type   5. Ureteral cancer, left ([place])      PLAN:  In order of problems listed above: Chronic combined systolic and diastolic CHF recent echo LVEF 45-50%, no CHF on exam, uses lasix about once a month  Chronic atrial fibrillation CHA2DS2-VASc equals 4 on Xarelto-no bleeding problems check labs today  Hypertension-BP controlled  HLD LDL 115 03/2019  Renal CA-had kidney removed. Check BMET on losartan and Xarelto   Shared Decision Making/Informed Consent        Medication Adjustments/Labs and Tests Ordered: Current medicines are reviewed at length with the patient today.  Concerns regarding medicines are outlined above.  Medication changes, Labs and Tests ordered today are listed in the Patient Instructions below. Patient Instructions  Medication Instructions:  Your physician recommends that you continue on your current medications as directed. Please refer to the Current Medication list given to you today.  *If you need a refill on your cardiac medications before your next appointment, please call your pharmacy*   Lab Work: Bmp, Cbc- Today   If you have labs (blood work) drawn today and your tests are completely normal, you will receive your results only by: Marland Kitchen MyChart Message (if you have MyChart)  OR . A paper copy in the mail If you have any lab test that is abnormal or we need to change your treatment, we will call you to review the results.   Testing/Procedures: None ordered    Follow-Up: At [facility], you and your health needs are our priority.  As part of our continuing mission to provide you with exceptional heart care, we have created designated Provider Care Teams.  These Care Teams include your primary Cardiologist (physician) and Advanced Practice Providers (APPs -  Physician Assistants and Nurse Practitioners) who all work together to provide you with the care you need, when you need it.  We recommend signing up for the patient portal called "MyChart".  Sign up information is provided on this After Visit Summary.  MyChart is used to connect with patients for Virtual Visits (Telemedicine).  Patients are able to view lab/test results, encounter notes, upcoming appointments, etc.  Non-urgent messages can be sent to your provider as well.   To learn more about what you can do with MyChart, go to [email or web address].    Your next appointment:   6 month(s)  The format for your next appointment:   In Person  Provider:   You may see Dr. Gwyndolyn Kaufman or one of the following Advanced Practice Providers on your designated Care Team:    Ermalinda Barrios , PA-C     Other Instructions None        Signed, Ermalinda Barrios, PA-C  01/05/2021 9:13 AM    [address], [place], [place]  [address] Phone: [phone]; Fax: [phone]

## 2021-01-05 ENCOUNTER — Encounter: Payer: Self-pay | Admitting: Physician Assistant

## 2021-01-05 ENCOUNTER — Other Ambulatory Visit: Payer: Self-pay

## 2021-01-05 ENCOUNTER — Ambulatory Visit (INDEPENDENT_AMBULATORY_CARE_PROVIDER_SITE_OTHER): Payer: Medicare Other | Admitting: Physician Assistant

## 2021-01-05 VITALS — BP 124/78 | HR 84 | Ht 61.0 in | Wt 126.2 lb

## 2021-01-05 DIAGNOSIS — E785 Hyperlipidemia, unspecified: Secondary | ICD-10-CM | POA: Diagnosis not present

## 2021-01-05 DIAGNOSIS — I5042 Chronic combined systolic (congestive) and diastolic (congestive) heart failure: Secondary | ICD-10-CM | POA: Diagnosis not present

## 2021-01-05 DIAGNOSIS — I10 Essential (primary) hypertension: Secondary | ICD-10-CM | POA: Diagnosis not present

## 2021-01-05 DIAGNOSIS — C662 Malignant neoplasm of left ureter: Secondary | ICD-10-CM

## 2021-01-05 DIAGNOSIS — I4819 Other persistent atrial fibrillation: Secondary | ICD-10-CM | POA: Diagnosis not present

## 2021-01-05 LAB — BASIC METABOLIC PANEL
BUN/Creatinine Ratio: 27 (ref 12–28)
BUN: 30 mg/dL — ABNORMAL HIGH (ref 8–27)
CO2: 22 mmol/L (ref 20–29)
Calcium: 9.5 mg/dL (ref 8.7–10.3)
Chloride: 102 mmol/L (ref 96–106)
Creatinine, Ser: 1.13 mg/dL — ABNORMAL HIGH (ref 0.57–1.00)
Glucose: 74 mg/dL (ref 65–99)
Potassium: 4.9 mmol/L (ref 3.5–5.2)
Sodium: 141 mmol/L (ref 134–144)
eGFR: 47 mL/min/{1.73_m2} — ABNORMAL LOW (ref >59)

## 2021-01-05 LAB — CBC
Hematocrit: 42.8 % (ref 34.0–46.6)
Hemoglobin: 14.2 g/dL (ref 11.1–15.9)
MCH: 31 pg (ref 26.6–33.0)
MCHC: 33.2 g/dL (ref 31.5–35.7)
MCV: 93 fL (ref 79–97)
Platelets: 176 10*3/uL (ref 150–450)
RBC: 4.58 x10E6/uL (ref 3.77–5.28)
RDW: 13.1 % (ref 11.7–15.4)
WBC: 6.7 10*3/uL (ref 3.4–10.8)

## 2021-01-05 NOTE — Patient Instructions (Addendum)
Medication Instructions:  Your physician recommends that you continue on your current medications as directed. Please refer to the Current Medication list given to you today.  *If you need a refill on your cardiac medications before your next appointment, please call your pharmacy*   Lab Work: Bmp, Cbc- Today   If you have labs (blood work) drawn today and your tests are completely normal, you will receive your results only by: Marland Kitchen MyChart Message (if you have MyChart) OR . A paper copy in the mail If you have any lab test that is abnormal or we need to change your treatment, we will call you to review the results.   Testing/Procedures: None ordered    Follow-Up: At [facility], you and your health needs are our priority.  As part of our continuing mission to provide you with exceptional heart care, we have created designated Provider Care Teams.  These Care Teams include your primary Cardiologist (physician) and Advanced Practice Providers (APPs -  Physician Assistants and Nurse Practitioners) who all work together to provide you with the care you need, when you need it.  We recommend signing up for the patient portal called "MyChart".  Sign up information is provided on this After Visit Summary.  MyChart is used to connect with patients for Virtual Visits (Telemedicine).  Patients are able to view lab/test results, encounter notes, upcoming appointments, etc.  Non-urgent messages can be sent to your provider as well.   To learn more about what you can do with MyChart, go to [email or web address].    Your next appointment:   6 month(s)  The format for your next appointment:   In Person  Provider:   You may see Dr. Gwyndolyn Kaufman or one of the following Advanced Practice Providers on your designated Care Team:    Ermalinda Barrios , PA-C     Other Instructions None

## 2021-04-12 NOTE — Progress Notes (Signed)
Cardiology Office Note    Date:  04/13/2021   ID:  Sara Griffin, Alferd Apa 1930-08-17, MRN [id number]   PCP:  Algis Greenhouse, MD   [place]  Cardiologist:  Ena Dawley, MD (Inactive)   Advanced Practice Provider:  Imogene Burn, PA-C Electrophysiologist:  None   [id number]   Chief Complaint  Patient presents with   Follow-up     History of Present Illness:  Sara Griffin is a 87 y.o. female with history of chronic Atrial fibrillation chadsvasc=4 on xarelto, HTN, HLD, carotid artery stenosis, mod MR/TR, systolic CHF, CKD stage 3.Echo 11/10/17 showed mild LVH, EF 40-45%, dyskinesis of anteroseptal myocardium, moderate mitral regurgitation, severely dilated LA/RA, moderate TR, nuclear stress test 02/2016 which was negative for ischemia.    Had surgery for noninvasive high grade papillary urothelial carcinoma.   Last saw [name] 08/24/20 and doing well repeat echo done and showed improved LVEF 50% with persistent anteroseptal dyskinesis unchanged and was cleared for surgery.  I last saw the patient 01/05/2021 at which time she was still living alone and overall doing well except for fatigue.  Was only using Lasix about once a month.  Patient comes in because she complains of fatigue and BP running low for a couple of weeks but readings from home 120/60. Not checking her pulse. Feeling better the past 2 days. She stays active cleaning the house, walks 15 min. Denies chest pain, shortness of breath. Had some bleeding in her urine a couple weeks ago but was checked by urology and they thought it was ok. No further bleeding. Has an appt thurs for right lower back pain.      Past Medical History:  Diagnosis Date   Cardiomyopathy ([place])    Echo 1/19: mild LVH, EF 40-45, ant-sept DK, mild AI, MAC, mod MR, severe BAE, mod TR   Chronic atrial fibrillation (HCC)    Chronic systolic CHF (congestive heart failure) ([place])    a. dx 10/2017 but sx dating back  to 08/2017.   CKD (chronic kidney disease), stage III (HCC)    a. ? CKD - baseline Cr 0.8-1.1.   Dysrhythmia    Heart murmur    Hx of colonic polyps    Hyperlipidemia    Hypertension    Hypothyroidism    Mild carotid artery disease ([place])    a. Duplex 2014 mild carotid plaque 0-39% BICA.   Moderate mitral regurgitation 10/2017   Moderate tricuspid regurgitation 10/2017   PVC's (premature ventricular contractions)    Uterine cancer (HCC)    Varicose veins of leg with pain, bilateral     Past Surgical History:  Procedure Laterality Date   ABDOMINAL HYSTERECTOMY     ABDOMINAL HYSTERECTOMY     APPENDECTOMY     COLON SURGERY     CYSTOSCOPY  10/01/2020   Procedure: CYSTOSCOPY;  Surgeon: Raynelle Bring, MD;  Location: [facility];  Service: Urology;;   CYSTOSCOPY W/ URETERAL STENT PLACEMENT Left 08/13/2020   Procedure: CYSTOSCOPY WITH TRANSURETHRAL RESECTION OF BLADDER/URETERAL TUMOR;  Surgeon: Raynelle Bring, MD;  Location: [facility];  Service: Urology;  Laterality: Left;   EYE SURGERY     cataract surgery bilat with lens implants   PARTIAL COLECTOMY     ROBOT ASSITED LAPAROSCOPIC NEPHROURETERECTOMY Left 10/01/2020   Procedure: XI ROBOT ASSITED LAPAROSCOPIC NEPHROURETERECTOMY;  Surgeon: Raynelle Bring, MD;  Location: [facility];  Service: Urology;  Laterality: Left;   TONSILLECTOMY      Current Medications:  Current Meds  Medication Sig   Cholecalciferol (DIALYVITE VITAMIN D 5000 PO) Take by mouth.   furosemide (LASIX) 20 MG tablet Take 20 mg by mouth daily as needed for edema.    levothyroxine (SYNTHROID) 25 MCG tablet Take 25 mcg by mouth daily before breakfast.    losartan (COZAAR) 25 MG tablet Take 25 mg by mouth daily. Patient is taking 12.5 mg daily   Rivaroxaban (XARELTO) 15 MG TABS tablet Take 1 tablet (15 mg total) by mouth daily with supper.   [DISCONTINUED] diltiazem (CARDIZEM CD) 120 MG 24 hr capsule TAKE 1 CAPSULE(120 MG) BY MOUTH DAILY     Allergies:   Hydrocod polst-cpm polst  er   Social History   Socioeconomic History   Marital status: Widowed    Spouse name: Not on file   Number of children: 5   Years of education: Not on file   Highest education level: Not on file  Occupational History   Not on file  Tobacco Use   Smoking status: Never   Smokeless tobacco: Never  Vaping Use   Vaping Use: Never used  Substance and Sexual Activity   Alcohol use: No   Drug use: No   Sexual activity: Not on file  Other Topics Concern   Not on file  Social History Narrative   Not on file   Social Determinants of Health   Financial Resource Strain: Not on file  Food Insecurity: Not on file  Transportation Needs: Not on file  Physical Activity: Not on file  Stress: Not on file  Social Connections: Not on file     Family History:  The patient's  family history includes Heart attack (age of onset: 37) in her son; Heart attack (age of onset: 70) in her father; Heart disease in an other family member.   ROS:   Please see the history of present illness.    ROS All other systems reviewed and are negative.   PHYSICAL EXAM:   VS:  BP 116/80   Pulse 75   Ht _0  (1.575 m)   Wt 123 lb 3.2 oz (55.9 kg)   SpO2 93%   BMI 22.53 kg/m   Physical Exam  GEN: Thin, in no acute distress  Neck: no JVD, carotid bruits, or masses Cardiac: Irregular; no murmurs, rubs, or gallops  Respiratory:  clear to auscultation bilaterally, normal work of breathing GI: soft, nontender, nondistended, + BS Ext: Varicosities with venous stasis changes but Good distal pulses bilaterally Neuro:  Alert and Oriented x 3 Psych: euthymic mood, full affect  Wt Readings from Last 3 Encounters:  04/13/21 123 lb 3.2 oz (55.9 kg)  01/05/21 126 lb 3.2 oz (57.2 kg)  10/01/20 117 lb 9.6 oz (53.3 kg)      Studies/Labs Reviewed:   EKG:  EKG is  ordered today.  The ekg ordered today demonstrates atrial fibrillation at 75 bpm poor R wave progression anteriorly, no acute change  Recent  Labs: 01/05/2021: BUN 30; Creatinine, Ser 1.13; Hemoglobin 14.2; Platelets 176; Potassium 4.9; Sodium 141   Lipid Panel    Component Value Date/Time   CHOL 219 (H) 04/15/2019 0737   TRIG 103 04/15/2019 0737   HDL 83 04/15/2019 0737   CHOLHDL 2.6 04/15/2019 0737   LDLCALC 115 (H) 04/15/2019 0737    Additional studies/ records that were reviewed today include:  2D echo 08/24/2020 IMPRESSIONS     1. Left ventricular ejection fraction, by estimation, is 45 to 50%. The  left  ventricle has mildly decreased function. The left ventricle  demonstrates regional wall motion abnormalities (see scoring  diagram/findings for description). Left ventricular  diastolic function could not be evaluated. There is dyskinesis of the left  ventricular, entire anteroseptal wall.   2. Right ventricular systolic function is mildly reduced. The right  ventricular size is mildly enlarged. There is mildly elevated pulmonary  artery systolic pressure.   3. Left atrial size was moderately dilated.   4. Right atrial size was severely dilated.   5. The mitral valve is normal in structure. Mild to moderate mitral valve  regurgitation. No evidence of mitral stenosis. Moderate mitral annular  calcification.   6. Tricuspid valve regurgitation is moderate.   7. The aortic valve is tricuspid. There is moderate calcification of the  aortic valve. There is moderate thickening of the aortic valve. Aortic  valve regurgitation is mild. Mild to moderate aortic valve  sclerosis/calcification is present, without any  evidence of aortic stenosis.   8. The inferior vena cava is dilated in size with >50% respiratory  variability, suggesting right atrial pressure of 8 mmHg.   Comparison(s): Prior images reviewed side by side. Changes from prior  study are noted. EF slightly improved compared to prior, with unchanged  dyskinesis of the anteroseptum.   NST 6/9/2021Study Highlights   Nuclear stress EF: 61%. There was no ST  segment deviation noted during stress. The left ventricular ejection fraction is normal (55-65%). The study is normal. This is a low risk study. No ischemia.   2D echo 2019 - Left ventricle: The cavity size was normal. Wall thickness was    increased in a pattern of mild LVH. Systolic function was mildly    to moderately reduced. The estimated ejection fraction was in the    range of 40% to 45%. There is dyskinesis of the anteroseptal    myocardium.  - Aortic valve: There was mild regurgitation.  - Mitral valve: Calcified annulus. There was moderate    regurgitation.  - Left atrium: The atrium was severely dilated.  - Right atrium: The atrium was severely dilated.  - Tricuspid valve: There was moderate regurgitation.   Impressions:  - Dyskinesis of the septum with overall mild to moderate LV    dysfunction (EF 40); mild LVH; AI; moderate MR; severe biatrial    enlargement; moderate TR.      Risk Assessment/Calculations:    CHA2DS2-VASc Score = 6  This indicates a 9.7% annual risk of stroke. The patient's score is based upon: CHF History: Yes HTN History: Yes Diabetes History: No Stroke History: No Vascular Disease History: Yes Age Score: 2 Gender Score: 1        ASSESSMENT:    1. Chronic combined systolic and diastolic CHF (congestive heart failure) (HCC)   2. Persistent atrial fibrillation ([place])   3. Essential hypertension   4. Hyperlipidemia, unspecified hyperlipidemia type   5. Ureteral cancer, left ([place])   6. Other fatigue      PLAN:  In order of problems listed above:  Fatigue for couple of weeks.  No chest pain dyspnea or other associated symptoms.  Saw urology for bleeding problems at the end of May but that was stable.  TSH was normal in April.  She is feeling better the past 2 to 3 days.  She is quite active and still lives alone.  Her son thinks it may be age-related fatigue.  We will check c-Met and CBC.  I have asked her to keep up with  her heart  rate when she does blood pressure checks and consider placing monitor if she does not improve.   Chronic combined systolic and diastolic CHF recent echo LVEF 45-50%, no CHF on exam, uses lasix rarely   Chronic atrial fibrillation CHA2DS2-VASc equals 4 on Xarelto-no bleeding problems check labs today-heart rate 75 on low-dose diltiazem.  Patient will keep up with her heart rates at home before committing to wearing a monitor.   Hypertension-BP controlled   HLD LDL 115 03/2019   Renal CA-had kidney removed.  Creatinine 1.13 01/05/2021  Shared Decision Making/Informed Consent        Medication Adjustments/Labs and Tests Ordered: Current medicines are reviewed at length with the patient today.  Concerns regarding medicines are outlined above.  Medication changes, Labs and Tests ordered today are listed in the Patient Instructions below. Patient Instructions  Medication Instructions:  Your physician recommends that you continue on your current medications as directed. Please refer to the Current Medication list given to you today.  *If you need a refill on your cardiac medications before your next appointment, please call your pharmacy*   Lab Work: CMET and CBC today If you have labs (blood work) drawn today and your tests are completely normal, you will receive your results only by: [place] (if you have MyChart) OR A paper copy in the mail If you have any lab test that is abnormal or we need to change your treatment, we will call you to review the results.   Testing/Procedures: None   Follow-Up: At [facility], you and your health needs are our priority.  As part of our continuing mission to provide you with exceptional heart care, we have created designated Provider Care Teams.  These Care Teams include your primary Cardiologist (physician) and Advanced Practice Providers (APPs -  Physician Assistants and Nurse Practitioners) who all work together to provide you with the  care you need, when you need it.  We recommend signing up for the patient portal called "MyChart".  Sign up information is provided on this After Visit Summary.  MyChart is used to connect with patients for Virtual Visits (Telemedicine).  Patients are able to view lab/test results, encounter notes, upcoming appointments, etc.  Non-urgent messages can be sent to your provider as well.   To learn more about what you can do with MyChart, go to [email or web address].    Your next appointment:    Monday, 10/11/21 at 8:20 AM   The format for your next appointment:   In Person  Provider:   Gwyndolyn Kaufman, MD      Signed, Ermalinda Barrios, PA-C  04/13/2021 12:51 PM    [address], [place], [place]  [address] Phone: [phone]; Fax: [phone]

## 2021-04-13 ENCOUNTER — Ambulatory Visit (INDEPENDENT_AMBULATORY_CARE_PROVIDER_SITE_OTHER): Payer: Medicare Other | Admitting: Physician Assistant

## 2021-04-13 ENCOUNTER — Other Ambulatory Visit: Payer: Self-pay

## 2021-04-13 ENCOUNTER — Encounter: Payer: Self-pay | Admitting: Physician Assistant

## 2021-04-13 VITALS — BP 116/80 | HR 75 | Ht 62.0 in | Wt 123.2 lb

## 2021-04-13 DIAGNOSIS — I10 Essential (primary) hypertension: Secondary | ICD-10-CM | POA: Diagnosis not present

## 2021-04-13 DIAGNOSIS — E785 Hyperlipidemia, unspecified: Secondary | ICD-10-CM | POA: Diagnosis not present

## 2021-04-13 DIAGNOSIS — I5042 Chronic combined systolic (congestive) and diastolic (congestive) heart failure: Secondary | ICD-10-CM | POA: Diagnosis not present

## 2021-04-13 DIAGNOSIS — I4819 Other persistent atrial fibrillation: Secondary | ICD-10-CM | POA: Diagnosis not present

## 2021-04-13 DIAGNOSIS — R5383 Other fatigue: Secondary | ICD-10-CM

## 2021-04-13 DIAGNOSIS — C662 Malignant neoplasm of left ureter: Secondary | ICD-10-CM

## 2021-04-13 MED ORDER — DILTIAZEM HCL ER COATED BEADS 120 MG PO CP24: ORAL_CAPSULE | ORAL | 3 refills | Status: DC

## 2021-04-13 NOTE — Patient Instructions (Signed)
Medication Instructions:  Your physician recommends that you continue on your current medications as directed. Please refer to the Current Medication list given to you today.  *If you need a refill on your cardiac medications before your next appointment, please call your pharmacy*   Lab Work: CMET and CBC today If you have labs (blood work) drawn today and your tests are completely normal, you will receive your results only by: [place] (if you have MyChart) OR A paper copy in the mail If you have any lab test that is abnormal or we need to change your treatment, we will call you to review the results.   Testing/Procedures: None   Follow-Up: At [facility], you and your health needs are our priority.  As part of our continuing mission to provide you with exceptional heart care, we have created designated Provider Care Teams.  These Care Teams include your primary Cardiologist (physician) and Advanced Practice Providers (APPs -  Physician Assistants and Nurse Practitioners) who all work together to provide you with the care you need, when you need it.  We recommend signing up for the patient portal called "MyChart".  Sign up information is provided on this After Visit Summary.  MyChart is used to connect with patients for Virtual Visits (Telemedicine).  Patients are able to view lab/test results, encounter notes, upcoming appointments, etc.  Non-urgent messages can be sent to your provider as well.   To learn more about what you can do with MyChart, go to [email or web address].    Your next appointment:    Monday, 10/11/21 at 8:20 AM   The format for your next appointment:   In Person  Provider:   Gwyndolyn Kaufman, MD

## 2021-04-14 LAB — COMPREHENSIVE METABOLIC PANEL
ALT: 15 IU/L (ref 0–32)
AST: 23 IU/L (ref 0–40)
Albumin/Globulin Ratio: 2.6 — ABNORMAL HIGH (ref 1.2–2.2)
Albumin: 4.6 g/dL (ref 3.6–4.6)
Alkaline Phosphatase: 102 IU/L (ref 44–121)
BUN/Creatinine Ratio: 24 (ref 12–28)
BUN: 30 mg/dL — ABNORMAL HIGH (ref 8–27)
Bilirubin Total: 0.8 mg/dL (ref 0.0–1.2)
CO2: 19 mmol/L — ABNORMAL LOW (ref 20–29)
Calcium: 9.5 mg/dL (ref 8.7–10.3)
Chloride: 101 mmol/L (ref 96–106)
Creatinine, Ser: 1.23 mg/dL — ABNORMAL HIGH (ref 0.57–1.00)
Globulin, Total: 1.8 g/dL (ref 1.5–4.5)
Glucose: 85 mg/dL (ref 65–99)
Potassium: 4.7 mmol/L (ref 3.5–5.2)
Sodium: 139 mmol/L (ref 134–144)
Total Protein: 6.4 g/dL (ref 6.0–8.5)
eGFR: 42 mL/min/{1.73_m2} — ABNORMAL LOW (ref >59)

## 2021-04-14 LAB — CBC
Hematocrit: 44.5 % (ref 34.0–46.6)
Hemoglobin: 15 g/dL (ref 11.1–15.9)
MCH: 31.9 pg (ref 26.6–33.0)
MCHC: 33.7 g/dL (ref 31.5–35.7)
MCV: 95 fL (ref 79–97)
Platelets: 177 10*3/uL (ref 150–450)
RBC: 4.7 x10E6/uL (ref 3.77–5.28)
RDW: 13.1 % (ref 11.7–15.4)
WBC: 6.1 10*3/uL (ref 3.4–10.8)

## 2021-05-28 ENCOUNTER — Telehealth: Payer: Self-pay | Admitting: Cardiology

## 2021-05-28 NOTE — Telephone Encounter (Signed)
*  STAT* If patient is at the pharmacy, call can be transferred to refill team.   1. Which medications need to be refilled? (please list name of each medication and dose if known) losartan (COZAAR) 25 MG tablet  2. Which pharmacy/location (including street and city if local pharmacy) is medication to be sent to? [facility] [place], [place]  3. Do they need a 30 day or 90 day supply? 90 day    Pt is out of this medication

## 2021-05-31 MED ORDER — LOSARTAN POTASSIUM 25 MG PO TABS: 25.0000 mg | ORAL_TABLET | Freq: Every day | ORAL | 3 refills | Status: DC

## 2021-05-31 NOTE — Telephone Encounter (Signed)
Losartan refill sent to [facility], per pt request.

## 2021-06-09 ENCOUNTER — Telehealth: Payer: Self-pay | Admitting: Physician Assistant

## 2021-06-09 NOTE — Telephone Encounter (Signed)
  *  STAT* If patient is at the pharmacy, call can be transferred to refill team.   1. Which medications need to be refilled? (please list name of each medication and dose if known) furosemide (LASIX) 20 MG tablet  2. Which pharmacy/location (including street and city if local pharmacy) is medication to be sent to? [facility] [place], [place]  3. Do they need a 30 day or 90 day supply? 90 days

## 2021-06-10 MED ORDER — FUROSEMIDE 20 MG PO TABS: 20.0000 mg | ORAL_TABLET | Freq: Every day | ORAL | 3 refills | Status: DC | PRN

## 2021-06-10 NOTE — Telephone Encounter (Signed)
Refill for Lasix has been sent to [facility].

## 2021-10-10 NOTE — Progress Notes (Signed)
Cardiology Office Note:    Date:  10/11/2021   ID:  Sara Griffin, DOB [birth year], MRN [id number]  PCP:  Sara Greenhouse, MD   [facility] Providers Cardiologist:  Freada Bergeron, MD Cardiology APP:  Imogene Burn, PA-C {   Referring MD: Sara Greenhouse, MD    History of Present Illness:    Sara Griffin is a 87 y.o. female with a hx of chronic atrial fibrillation, HFrEF with LVEF 40-45%, HTN, HLD, carotid artery disease, moderate MR/TR, and CKD stave 3 who was previously followed by Dr. Meda Griffin who now presents to clinic for follow-up.  Per review of the record, Echo 11/10/17 showed mild LVH, EF 40-45%, dyskinesis of anteroseptal myocardium, moderate mitral regurgitation, severely dilated LA/RA, moderate TR. Nuclear stress test 02/2016 negative for ischemia. Repeat TTE in 08/2020 with LVEF 45-50% with persistent anteroseptal dyskinesis, mild RV dysfunction, mild-to-moderate MR, moderate TR. Myoview 03/2020 with LVEF 61%, no ischemia or infarction.  Last saw Sara Griffin on 04/13/21 where she was having fatigue. Labs reassuring at that time. No changes in meds made.  Today, the patient overall feels well. Continues to have intermittent fatigue, but this is much better than before. Denies chest pain, SOB, lightheadedness, dizziness or syncope. She is overall pretty active and still does all ADLs without issues. Monitors her blood pressures at home and mainly runs 120-130s. Did not take blood pressure pills this AM.  Past Medical History:  Diagnosis Date   Cardiomyopathy ([place])    Echo 1/19: mild LVH, EF 40-45, ant-sept DK, mild AI, MAC, mod MR, severe BAE, mod TR   Chronic atrial fibrillation (HCC)    Chronic systolic CHF (congestive heart failure) ([place])    a. dx 10/2017 but sx dating back to 08/2017.   CKD (chronic kidney disease), stage III (HCC)    a. ? CKD - baseline Cr 0.8-1.1.   Dysrhythmia    Heart murmur    Hx of colonic polyps    Hyperlipidemia     Hypertension    Hypothyroidism    Mild carotid artery disease ([place])    a. Duplex 2014 mild carotid plaque 0-39% BICA.   Moderate mitral regurgitation 10/2017   Moderate tricuspid regurgitation 10/2017   PVC's (premature ventricular contractions)    Uterine cancer (HCC)    Varicose veins of leg with pain, bilateral     Past Surgical History:  Procedure Laterality Date   ABDOMINAL HYSTERECTOMY     ABDOMINAL HYSTERECTOMY     APPENDECTOMY     COLON SURGERY     CYSTOSCOPY  10/01/2020   Procedure: CYSTOSCOPY;  Surgeon: Raynelle Bring, MD;  Location: [facility];  Service: Urology;;   CYSTOSCOPY W/ URETERAL STENT PLACEMENT Left 08/13/2020   Procedure: CYSTOSCOPY WITH TRANSURETHRAL RESECTION OF BLADDER/URETERAL TUMOR;  Surgeon: Raynelle Bring, MD;  Location: [facility];  Service: Urology;  Laterality: Left;   EYE SURGERY     cataract surgery bilat with lens implants   PARTIAL COLECTOMY     ROBOT ASSITED LAPAROSCOPIC NEPHROURETERECTOMY Left 10/01/2020   Procedure: XI ROBOT ASSITED LAPAROSCOPIC NEPHROURETERECTOMY;  Surgeon: Raynelle Bring, MD;  Location: [facility];  Service: Urology;  Laterality: Left;   TONSILLECTOMY      Current Medications: Current Meds  Medication Sig   Cholecalciferol (DIALYVITE VITAMIN D 5000 PO) Take 1 capsule by mouth daily.   furosemide (LASIX) 20 MG tablet Take 1 tablet (20 mg total) by mouth daily as needed for edema.   levothyroxine (SYNTHROID) 25 MCG  tablet Take 25 mcg by mouth daily before breakfast.    losartan (COZAAR) 25 MG tablet Take 1 tablet (25 mg total) by mouth daily. Patient is taking 12.5 mg daily   Rivaroxaban (XARELTO) 15 MG TABS tablet Take 1 tablet (15 mg total) by mouth daily with supper.   [DISCONTINUED] diltiazem (CARDIZEM CD) 120 MG 24 hr capsule TAKE 1 CAPSULE(120 MG) BY MOUTH DAILY     Allergies:   Carbamazepine and Hydrocod polst-cpm polst er   Social History   Socioeconomic History   Marital status: Widowed    Spouse name: Not on file    Number of children: 5   Years of education: Not on file   Highest education level: Not on file  Occupational History   Not on file  Tobacco Use   Smoking status: Never   Smokeless tobacco: Never  Vaping Use   Vaping Use: Never used  Substance and Sexual Activity   Alcohol use: No   Drug use: No   Sexual activity: Not on file  Other Topics Concern   Not on file  Social History Narrative   Not on file   Social Determinants of Health   Financial Resource Strain: Not on file  Food Insecurity: Not on file  Transportation Needs: Not on file  Physical Activity: Not on file  Stress: Not on file  Social Connections: Not on file     Family History: The patient's family history includes Heart attack (age of onset: 34) in her son; Heart attack (age of onset: 69) in her father; Heart disease in an other family member.  ROS:   Please see the history of present illness.    Review of Systems  Constitutional:  Negative for chills and fever.  HENT:  Positive for hearing loss.   Respiratory:  Negative for shortness of breath.   Cardiovascular:  Negative for chest pain, palpitations, orthopnea, claudication, leg swelling and PND.  Gastrointestinal:  Negative for blood in stool and melena.  Genitourinary:  Negative for hematuria.  Musculoskeletal:  Negative for falls.  Neurological:  Negative for dizziness and loss of consciousness.    EKGs/Labs/Other Studies Reviewed:    The following studies were reviewed today: 2D echo 08/24/2020 IMPRESSIONS   1. Left ventricular ejection fraction, by estimation, is 45 to 50%. The  left ventricle has mildly decreased function. The left ventricle  demonstrates regional wall motion abnormalities (see scoring  diagram/findings for description). Left ventricular  diastolic function could not be evaluated. There is dyskinesis of the left  ventricular, entire anteroseptal wall.   2. Right ventricular systolic function is mildly reduced. The right   ventricular size is mildly enlarged. There is mildly elevated pulmonary  artery systolic pressure.   3. Left atrial size was moderately dilated.   4. Right atrial size was severely dilated.   5. The mitral valve is normal in structure. Mild to moderate mitral valve  regurgitation. No evidence of mitral stenosis. Moderate mitral annular  calcification.   6. Tricuspid valve regurgitation is moderate.   7. The aortic valve is tricuspid. There is moderate calcification of the  aortic valve. There is moderate thickening of the aortic valve. Aortic  valve regurgitation is mild. Mild to moderate aortic valve  sclerosis/calcification is present, without any  evidence of aortic stenosis.   8. The inferior vena cava is dilated in size with >50% respiratory  variability, suggesting right atrial pressure of 8 mmHg.   Comparison(s): Prior images reviewed side by side. Changes from  prior  study are noted. EF slightly improved compared to prior, with unchanged  dyskinesis of the anteroseptum.   NST 6/9/2021Study Highlights   Nuclear stress EF: 61%. There was no ST segment deviation noted during stress. The left ventricular ejection fraction is normal (55-65%). The study is normal. This is a low risk study. No ischemia.   2D echo 2019 - Left ventricle: The cavity size was normal. Wall thickness was    increased in a pattern of mild LVH. Systolic function was mildly    to moderately reduced. The estimated ejection fraction was in the    range of 40% to 45%. There is dyskinesis of the anteroseptal    myocardium.  - Aortic valve: There was mild regurgitation.  - Mitral valve: Calcified annulus. There was moderate    regurgitation.  - Left atrium: The atrium was severely dilated.  - Right atrium: The atrium was severely dilated.  - Tricuspid valve: There was moderate regurgitation.   Impressions:  - Dyskinesis of the septum with overall mild to moderate LV    dysfunction (EF 40); mild LVH;  AI; moderate MR; severe biatrial    enlargement; moderate TR.   EKG:  No new tracing  Recent Labs: 04/13/2021: ALT 15; BUN 30; Creatinine, Ser 1.23; Hemoglobin 15.0; Platelets 177; Potassium 4.7; Sodium 139  Recent Lipid Panel    Component Value Date/Time   CHOL 219 (H) 04/15/2019 0737   TRIG 103 04/15/2019 0737   HDL 83 04/15/2019 0737   CHOLHDL 2.6 04/15/2019 0737   LDLCALC 115 (H) 04/15/2019 0737     Risk Assessment/Calculations:    CHA2DS2-VASc Score = 6   This indicates a 9.7% annual risk of stroke. The patient's score is based upon: CHF History: 1 HTN History: 1 Diabetes History: 0 Stroke History: 0 Vascular Disease History: 1 Age Score: 2 Gender Score: 1        Physical Exam:    VS:  BP (!) 142/78    Pulse 84    Ht _0  (1.575 m)    Wt 131 lb 3.2 oz (59.5 kg)    SpO2 98%    BMI 24.00 kg/m     Wt Readings from Last 3 Encounters:  10/11/21 131 lb 3.2 oz (59.5 kg)  04/13/21 123 lb 3.2 oz (55.9 kg)  01/05/21 126 lb 3.2 oz (57.2 kg)     GEN:  Elderly female, comfortable HEENT: Normal NECK: No JVD; No carotid bruits CARDIAC: Irregularly irregular, 2/6 systolic murmur. No rubs, gallops RESPIRATORY:  Clear to auscultation without rales, wheezing or rhonchi  ABDOMEN: Soft, non-tender, non-distended MUSCULOSKELETAL:  Trace ankle edema SKIN: Warm and dry NEUROLOGIC:  Alert and oriented x 3 PSYCHIATRIC:  Normal affect   ASSESSMENT:    1. Chronic combined systolic and diastolic CHF (congestive heart failure) (HCC)   2. Persistent atrial fibrillation ([place])   3. Essential hypertension   4. Hyperlipidemia, unspecified hyperlipidemia type   5. Moderate mitral regurgitation   6. Mixed hyperlipidemia    PLAN:    In order of problems listed above:  #Chronic HF with Mildly Reduced EF: TTE in 08/2020 with LVEF 45-50% with anteroseptal dyskinesis. Clinically euvolemic. Doing well and remains active. Prefers to minimize medications at this time.  -Continue lasix  72m  -Continue losartan 12.544mdaily -Will not add spiro/SGLT2i as patient would like to minimize medications  #Chronic atrial fibrillation: CHADS-vasc 4. On xarelto and diltiazem. -Continue xarelto 1553maily -Continue dilt 120m58mily  #HTN: Mainly at goal  at home 120-130s. Elevated today but did not take meds yet.  -Continue losartan 12.68m daily -Continue dilt 1224mdaily  #CKD: #History of reneal cell carcinoma s/p nephrectomy: -Follow-up with PCP as scheduled  #Mild-moderate MR: #Moderate TR: -Continue monitoring with serial TTEs; next in 2023  #HLD: -Declines statin therapy; very reasonable at her age      Medication Adjustments/Labs and Tests Ordered: Current medicines are reviewed at length with the patient today.  Concerns regarding medicines are outlined above.  No orders of the defined types were placed in this encounter.  Meds ordered this encounter  Medications   diltiazem (CARDIZEM CD) 120 MG 24 hr capsule    Sig: TAKE 1 CAPSULE(120 MG) BY MOUTH DAILY    Dispense:  90 capsule    Refill:  3    Patient Instructions  Medication Instructions:  Your physician recommends that you continue on your current medications as directed. Please refer to the Current Medication list given to you today.  *If you need a refill on your cardiac medications before your next appointment, please call your pharmacy*   Lab Work: None ordered  If you have labs (blood work) drawn today and your tests are completely normal, you will receive your results only by: [place]if you have MyChart) OR A paper copy in the mail If you have any lab test that is abnormal or we need to change your treatment, we will call you to review the results.   Testing/Procedures: None ordered   Follow-Up: [facility] our [facility].  As part of our continuing mission to provide you with exceptional heart care, we have created designated Provider Care  Teams.  These Care Teams include your primary Cardiologist (physician) and Advanced Practice Providers (APPs -  Physician Assistants and Nurse Practitioners) who all work together to provide you with the care you need, when you need it.  We recommend signing up for the patient portal called "MyChart".  Sign up information is provided on this After Visit Summary.  MyChart is used to connect with patients for Virtual Visits (Telemedicine).  Patients are able to view lab/test results, encounter notes, upcoming appointments, etc.  Non-urgent messages can be sent to your provider as well.   To learn more about what you can do with MyChart, go to [email or web address]   Your next appointment:   6 month(s)  04/13/22 ARRIVE AT 8:45  The format for your next appointment:   In Person  Provider:   HeGwyndolyn KaufmanMD   Other Instructions     Signed, HeFreada BergeronMD  10/11/2021 8:45 AM    [place] [place]

## 2021-10-11 ENCOUNTER — Encounter: Payer: Self-pay | Admitting: Cardiology

## 2021-10-11 ENCOUNTER — Ambulatory Visit (INDEPENDENT_AMBULATORY_CARE_PROVIDER_SITE_OTHER): Payer: Medicare Other | Admitting: Cardiology

## 2021-10-11 ENCOUNTER — Other Ambulatory Visit: Payer: Self-pay

## 2021-10-11 VITALS — BP 142/78 | HR 84 | Ht 62.0 in | Wt 131.2 lb

## 2021-10-11 DIAGNOSIS — I340 Nonrheumatic mitral (valve) insufficiency: Secondary | ICD-10-CM

## 2021-10-11 DIAGNOSIS — E782 Mixed hyperlipidemia: Secondary | ICD-10-CM

## 2021-10-11 DIAGNOSIS — E785 Hyperlipidemia, unspecified: Secondary | ICD-10-CM | POA: Diagnosis not present

## 2021-10-11 DIAGNOSIS — I5042 Chronic combined systolic (congestive) and diastolic (congestive) heart failure: Secondary | ICD-10-CM | POA: Diagnosis not present

## 2021-10-11 DIAGNOSIS — I4819 Other persistent atrial fibrillation: Secondary | ICD-10-CM

## 2021-10-11 DIAGNOSIS — I10 Essential (primary) hypertension: Secondary | ICD-10-CM | POA: Diagnosis not present

## 2021-10-11 MED ORDER — DILTIAZEM HCL ER COATED BEADS 120 MG PO CP24: ORAL_CAPSULE | ORAL | 3 refills | Status: DC

## 2021-10-11 NOTE — Patient Instructions (Addendum)
Medication Instructions:  Your physician recommends that you continue on your current medications as directed. Please refer to the Current Medication list given to you today.  *If you need a refill on your cardiac medications before your next appointment, please call your pharmacy*   Lab Work: None ordered  If you have labs (blood work) drawn today and your tests are completely normal, you will receive your results only by: [place] (if you have MyChart) OR A paper copy in the mail If you have any lab test that is abnormal or we need to change your treatment, we will call you to review the results.   Testing/Procedures: None ordered   Follow-Up: At [facility], you and your health needs are our priority.  As part of our continuing mission to provide you with exceptional heart care, we have created designated Provider Care Teams.  These Care Teams include your primary Cardiologist (physician) and Advanced Practice Providers (APPs -  Physician Assistants and Nurse Practitioners) who all work together to provide you with the care you need, when you need it.  We recommend signing up for the patient portal called "MyChart".  Sign up information is provided on this After Visit Summary.  MyChart is used to connect with patients for Virtual Visits (Telemedicine).  Patients are able to view lab/test results, encounter notes, upcoming appointments, etc.  Non-urgent messages can be sent to your provider as well.   To learn more about what you can do with MyChart, go to [email or web address].    Your next appointment:   6 month(s)  04/13/22 ARRIVE AT 8:45  The format for your next appointment:   In Person  Provider:   Gwyndolyn Kaufman, MD   Other Instructions

## 2022-02-16 ENCOUNTER — Other Ambulatory Visit: Payer: Self-pay | Admitting: Urology

## 2022-02-16 ENCOUNTER — Telehealth: Payer: Self-pay | Admitting: Cardiology

## 2022-02-16 NOTE — Telephone Encounter (Signed)
Preoperative team, please contact this patient and set up a phone call appointment for further preoperative risk assessment. Please obtain consent and complete medication review. Thank you for your help.  ?

## 2022-02-16 NOTE — Telephone Encounter (Signed)
? ? ?  Pre-operative Risk Assessment  ?  ?Patient Name: Sara Griffin  ?DOB: 08-07-1930 ?MRN: [id number]  ? ?  ? ?Request for Surgical Clearance   ? ?Procedure:   cystoscopy bladder biopsy possible transurethral resection of a bladder tumor ? ?Date of Surgery:  Clearance TBD                              ?   ?Surgeon:  Dr. Raynelle Bring ?Surgeon's Group or Practice Name:  [facility]  ?Phone number:  [phone] ext [phone] ?Fax number:  [phone] ?  ?Type of Clearance Requested:   ?- Medical  ?- Pharmacy:  Hold Rivaroxaban (Xarelto) 3 days prior ?  ?Type of Anesthesia:  General  ?  ?Additional requests/questions:   ? ?Signed, ?[place]   ?02/16/2022, 2:45 PM   ?

## 2022-02-16 NOTE — Telephone Encounter (Signed)
Patient with diagnosis of afib on Xarelto for anticoagulation.   ? ?Procedure: cystoscopy bladder biopsy possible transurethral resection of a bladder tumor ?Date of procedure: TBD ? ?CHA2DS2-VASc Score = 6  ?This indicates a 9.7% annual risk of stroke. ?The patient's score is based upon: ?CHF History: 1 ?HTN History: 1 ?Diabetes History: 0 ?Stroke History: 0 ?Vascular Disease History: 1 ?Age Score: 2 ?Gender Score: 1 ?  ?CrCl 47m/min ?Platelet count 177K ? ?Per office protocol, patient can hold Xarelto for 3 days prior to procedure as requested. ?

## 2022-02-17 ENCOUNTER — Telehealth: Payer: Self-pay | Admitting: *Deleted

## 2022-02-17 NOTE — Telephone Encounter (Signed)
DPR ok to s/w pt's son [name]. [name] has helped make tele pre op appt 02/18/22 @ 10 am. Med rec and consent are done. ?

## 2022-02-17 NOTE — Telephone Encounter (Signed)
Left message to call back for tele pre op appt 

## 2022-02-17 NOTE — Telephone Encounter (Signed)
Please see note from [place]. It does not look like this request was routed to call back.  ?

## 2022-02-17 NOTE — Telephone Encounter (Signed)
DPR ok to s/w pt's son Sara Griffin. Sara Griffin has helped make tele pre op appt 02/18/22 @ 10 am. Med rec and consent are done. ? ?  ?Patient Consent for Virtual Visit  ? ? ?   ? ?Sara Griffin has provided verbal consent on 02/17/2022 for a virtual visit (video or telephone). ? ? ?CONSENT FOR VIRTUAL VISIT FOR:  Sara Griffin  ?By participating in this virtual visit I agree to the following: ? ?I hereby voluntarily request, consent and authorize [place] and its employed or contracted physicians, physician assistants, nurse practitioners or other licensed health care professionals (the Practitioner), to provide me with telemedicine health care services (the ?Services") as deemed necessary by the treating Practitioner. I acknowledge and consent to receive the Services by the Practitioner via telemedicine. I understand that the telemedicine visit will involve communicating with the Practitioner through live audiovisual communication technology and the disclosure of certain medical information by electronic transmission. I acknowledge that I have been given the opportunity to request an in-person assessment or other available alternative prior to the telemedicine visit and am voluntarily participating in the telemedicine visit. ? ?I understand that I have the right to withhold or withdraw my consent to the use of telemedicine in the course of my care at any time, without affecting my right to future care or treatment, and that the Practitioner or I may terminate the telemedicine visit at any time. I understand that I have the right to inspect all information obtained and/or recorded in the course of the telemedicine visit and may receive copies of available information for a reasonable fee.  I understand that some of the potential risks of receiving the Services via telemedicine include:  ?Delay or interruption in medical evaluation due to technological equipment failure or disruption; ?Information transmitted may not be  sufficient (e.g. poor resolution of images) to allow for appropriate medical decision making by the Practitioner; and/or  ?In rare instances, security protocols could fail, causing a breach of personal health information. ? ?Furthermore, I acknowledge that it is my responsibility to provide information about my medical history, conditions and care that is complete and accurate to the best of my ability. I acknowledge that Practitioner's advice, recommendations, and/or decision may be based on factors not within their control, such as incomplete or inaccurate data provided by me or distortions of diagnostic images or specimens that may result from electronic transmissions. I understand that the practice of medicine is not an exact science and that Practitioner makes no warranties or guarantees regarding treatment outcomes. I acknowledge that a copy of this consent can be made available to me via my patient portal ([facility]), or I can request a printed copy by calling the office of [facility].   ? ?I understand that my insurance will be billed for this visit.  ? ?I have read or had this consent read to me. ?I understand the contents of this consent, which adequately explains the benefits and risks of the Services being provided via telemedicine.  ?I have been provided ample opportunity to ask questions regarding this consent and the Services and have had my questions answered to my satisfaction. ?I give my informed consent for the services to be provided through the use of telemedicine in my medical care ? ? ? ?

## 2022-02-18 ENCOUNTER — Ambulatory Visit (INDEPENDENT_AMBULATORY_CARE_PROVIDER_SITE_OTHER): Payer: Medicare Other | Admitting: Physician Assistant

## 2022-02-18 DIAGNOSIS — Z01810 Encounter for preprocedural cardiovascular examination: Secondary | ICD-10-CM

## 2022-02-18 NOTE — Progress Notes (Signed)
? ?Virtual Visit via Telephone Note  ? ?This visit type was conducted due to national recommendations for restrictions regarding the COVID-19 Pandemic (e.g. social distancing) in an effort to limit this patient's exposure and mitigate transmission in our community.  Due to her co-morbid illnesses, this patient is at least at moderate risk for complications without adequate follow up.  This format is felt to be most appropriate for this patient at this time.  The patient did not have access to video technology/had technical difficulties with video requiring transitioning to audio format only (telephone).  All issues noted in this document were discussed and addressed.  No physical exam could be performed with this format.  Please refer to the patient's chart for her  consent to telehealth for [facility] ? ?Evaluation Performed:  Preoperative cardiovascular risk assessment ?_____________  ? ?Date:  02/18/2022  ? ?Patient ID:  Sara Griffin, DOB 1930/08/09, MRN [id number] ?Patient Location:  ?Home ?Provider location:   ?Office ? ?Primary Care Provider:  Algis Greenhouse, MD ?Primary Cardiologist:  Freada Bergeron, MD ? ?Chief Complaint  ?  ?88 y.o. y/o female with a h/o HFrEF, permanent Afib on Xarelto, carotid artery disease, HTN, HLD, CKD stage III, and moderate MR/TR, who is pending cystoscopy with biopsy vs transurethral resection of bladder tumor scheduled for 03/17/2022, and presents today for telephonic preoperative cardiovascular risk assessment. ? ?Past Medical History  ?  ?Past Medical History:  ?Diagnosis Date  ? Cardiomyopathy ([place])   ? Echo 1/19: mild LVH, EF 40-45, ant-sept DK, mild AI, MAC, mod MR, severe BAE, mod TR  ? Chronic atrial fibrillation (HCC)   ? Chronic systolic CHF (congestive heart failure) ([place])   ? a. dx 10/2017 but sx dating back to 08/2017.  ? CKD (chronic kidney disease), stage III ([place])   ? a. ? CKD - baseline Cr 0.8-1.1.  ? Dysrhythmia   ? Heart murmur   ? Hx of colonic  polyps   ? Hyperlipidemia   ? Hypertension   ? Hypothyroidism   ? Mild carotid artery disease ([place])   ? a. Duplex 2014 mild carotid plaque 0-39% BICA.  ? Moderate mitral regurgitation 10/2017  ? Moderate tricuspid regurgitation 10/2017  ? PVC's (premature ventricular contractions)   ? Uterine cancer ([place])   ? Varicose veins of leg with pain, bilateral   ? ?Past Surgical History:  ?Procedure Laterality Date  ? ABDOMINAL HYSTERECTOMY    ? ABDOMINAL HYSTERECTOMY    ? APPENDECTOMY    ? COLON SURGERY    ? CYSTOSCOPY  10/01/2020  ? Procedure: CYSTOSCOPY;  Surgeon: Raynelle Bring, MD;  Location: [facility];  Service: Urology;;  ? CYSTOSCOPY W/ URETERAL STENT PLACEMENT Left 08/13/2020  ? Procedure: CYSTOSCOPY WITH TRANSURETHRAL RESECTION OF BLADDER/URETERAL TUMOR;  Surgeon: Raynelle Bring, MD;  Location: [facility];  Service: Urology;  Laterality: Left;  ? EYE SURGERY    ? cataract surgery bilat with lens implants  ? PARTIAL COLECTOMY    ? ROBOT ASSITED LAPAROSCOPIC NEPHROURETERECTOMY Left 10/01/2020  ? Procedure: XI ROBOT ASSITED LAPAROSCOPIC NEPHROURETERECTOMY;  Surgeon: Raynelle Bring, MD;  Location: [facility];  Service: Urology;  Laterality: Left;  ? TONSILLECTOMY    ? ? ?Allergies ? ?Allergies  ?Allergen Reactions  ? Carbamazepine   ?  Almost comatose  ? Hydrocod Poli-Chlorphe Poli Er Other (See Comments)  ?  Felt bad  ? ? ?History of Present Illness  ?  ?Sara Griffin is a 88 y.o. female who presents via audio/video  conferencing for a telehealth visit today.  Pt was last seen in cardiology clinic on 10/11/2021 by Dr. Johney Frame.  At that time Sara Griffin was doing well, without symptoms of angina or decompensation.  The patient is now pending cystoscopy with biopsy vs transurethral resection of bladder tumor.  Since her last visit, she has done well and has been without symptoms of angina or decompensation.  She remains very active at baseline.  The chart has been reviewed by our pharmacy team with recommendation to hold  Xarelto 3 days prior to procedure.  ? ? ?Home Medications  ?  ?Prior to Admission medications   ?Medication Sig Start Date End Date Taking? Authorizing Provider  ?Cholecalciferol (DIALYVITE VITAMIN D 5000 PO) Take 1 capsule by mouth daily. ?Patient not taking: Reported on 02/17/2022    [provider]  ?Cholecalciferol (VITAMIN D3) 1.25 MG (50000 UT) CAPS Take 1 capsule by mouth daily.    [provider]  ?diltiazem (CARDIZEM CD) 120 MG 24 hr capsule TAKE 1 CAPSULE(120 MG) BY MOUTH DAILY 10/11/21   Freada Bergeron, MD  ?furosemide (LASIX) 20 MG tablet Take 1 tablet (20 mg total) by mouth daily as needed for edema. 06/10/21   Freada Bergeron, MD  ?levothyroxine (SYNTHROID) 25 MCG tablet Take 25 mcg by mouth daily before breakfast.  02/17/20   [provider]  ?losartan (COZAAR) 25 MG tablet Take 1 tablet (25 mg total) by mouth daily. Patient is taking 12.5 mg daily ?Patient taking differently: Take 25 mg by mouth daily. 05/31/21   Freada Bergeron, MD  ?Rivaroxaban (XARELTO) 15 MG TABS tablet Take 1 tablet (15 mg total) by mouth daily with supper. 08/24/20   [name], MD  ? ? ?Physical Exam  ?  ?Vital Signs:  Sara Griffin does not have vital signs available for review today. ? ?Given telephonic nature of communication, physical exam is limited. ?AAOx3. NAD. Normal affect.  Speech and respirations are unlabored. ? ?Accessory Clinical Findings  ?  ?None ? ?Assessment & Plan  ?  ?1.  Preoperative Cardiovascular Risk Assessment: The patient affirms she has been doing well without any new cardiac symptoms. They are able to achieve > 4 METS without cardiac limitations. RCRI: low risk for noncardiac surgery.  She may hold Xarelto 3 days prior to her procedure with recommendation for this medication to be resumed as soon as safely possible post procedure ot minimize interruption in anticoagulation.  Therefore, based on ACC/AHA guidelines, the patient would be at acceptable  risk for the planned procedure without further cardiovascular testing. The patient was advised that if she develops new symptoms prior to surgery to contact our office to arrange for a follow-up visit, and she verbalized understanding. ? ? ? ?A copy of this note will be routed to requesting surgeon. ? ?Time:   ?Today, I have spent 8 minutes with the patient with telehealth technology discussing medical history, symptoms, and management plan.   ? ? ?Christell Faith, PA-C ? ?02/18/2022, 10:10 AM ? ?

## 2022-03-04 NOTE — Progress Notes (Addendum)
COVID Vaccine Completed:  Yes x3 ? ?Date of COVID positive in last 90 days:  No ? ?PCP - [name], MD ?Cardiologist - Gwyndolyn Kaufman, MD ? ?Cardiac clearance in Epic dated 02-18-22 by Christell Faith PA-C ? ?Chest x-ray - N/A ?EKG - 04-13-21 Epic ?Stress Test - 04-01-20 Epic ?ECHO - 08-24-20 Epic ?Cardiac Cath - N/A ?Pacemaker/ICD device last checked: ?Spinal Cord Stimulator: ?Holter monitor - 2019 Epic ? ?Bowel Prep -  ? ?Sleep Study - N/A ?CPAP -  ? ?Fasting Blood Sugar - N/A ?Checks Blood Sugar _____ times a day ? ?Blood Thinner Instructions: Xarelto.  Hold x3 days.  Patient is aware ?Aspirin Instructions: ?Last Dose: ? ?Activity level:   Can go up a flight of stairs and perform activities of daily living without stopping and without symptoms of chest pain or shortness of breath. ?   ?Anesthesia review: cardiomyopathy, Afib, CHF, CKD, murmur, HTN, PVCs ? ?BP elevated at PAT 189/100 and on recheck 177/106.  Patient stated that she had not taken BP meds this morning.  Denies symptoms of headache, chest pain or SOB.  Will take meds when she gets home and recheck BP ? ?Patient denies shortness of breath, fever, cough and chest pain at PAT appointment ? ?Patient verbalized understanding of instructions that were given to them at the PAT appointment. Patient was also instructed that they will need to review over the PAT instructions again at home before surgery.  ?

## 2022-03-04 NOTE — Patient Instructions (Addendum)
DUE TO COVID-19 ONLY TWO VISITORS  (aged 86 and older)  IS ALLOWED TO COME WITH YOU AND STAY IN THE WAITING ROOM ONLY DURING PRE OP AND PROCEDURE.   ?**NO VISITORS ARE ALLOWED IN THE SHORT STAY AREA OR RECOVERY ROOM!!** ? ?You are not required to quarantine ?Hand Hygiene often ?Do NOT share personal items ?Notify your provider if you are in close contact with someone who has COVID or you develop fever 100.4 or greater, new onset of sneezing, cough, sore throat, shortness of breath or body aches. ? ?     ? Your procedure is scheduled on:  03-17-22 ? ? Report to [facility] Main Entrance ? ?  Report to admitting at 9:45 AM ? ? Call this number if you have problems the morning of surgery [phone] ? ? Do not eat food :After Midnight. ? ? After Midnight you may have the following liquids until 9:00 AM DAY OF SURGERY ? ?Water ?Black Coffee (sugar ok, NO MILK/CREAM OR CREAMERS)  ?Tea (sugar ok, NO MILK/CREAM OR CREAMERS) regular and decaf                             ?Plain Jell-O (NO RED)                                           ?Fruit ices (not with fruit pulp, NO RED)                                     ?Popsicles (NO RED)                                                                  ?Juice: apple, WHITE grape, WHITE cranberry ?Sports drinks like Gatorade (NO RED) ?Clear broth(vegetable,chicken,beef) ? ?           ?FOLLOW ANY ADDITIONAL PRE OP INSTRUCTIONS YOU RECEIVED FROM YOUR SURGEON'S OFFICE!!! ?  ?  ?Oral Hygiene is also important to reduce your risk of infection.                                    ?Remember - BRUSH YOUR TEETH THE MORNING OF SURGERY WITH YOUR REGULAR TOOTHPASTE ? ? Do NOT smoke after Midnight ? ? Take these medicines the morning of surgery with A SIP OF WATER: Diltiazem, Levothyroxine ?                  ?           You may not have any metal on your body including hair pins, jewelry, and body piercing ? ?           Do not wear make-up, lotions, powders, perfumes or deodorant ? ?Do  not wear nail polish including gel and S&S, artificial/acrylic nails, or any other type of covering on natural nails including finger and toenails. If you have artificial nails, gel coating, etc. that needs to be removed by  a nail salon please have this removed prior to surgery or surgery may need to be canceled/ delayed if the surgeon/ anesthesia feels like they are unable to be safely monitored.  ? ?Do not shave  48 hours prior to surgery.  ? ?       Do not bring valuables to the hospital. [place]. ? ? Contacts, dentures or bridgework may not be worn into surgery. ?  ?Patients discharged on the day of surgery will not be allowed to drive home.  Someone NEEDS to stay with you for the first 24 hours after anesthesia. ? ?Special Instructions: Bring a copy of your healthcare power of attorney and living will documents the day of surgery if you haven't scanned them before. ? ?Please read over the following fact sheets you were given: IF [place]  ? ?[place] - Preparing for Surgery ?Before surgery, you can play an important role.  Because skin is not sterile, your skin needs to be as free of germs as possible.  You can reduce the number of germs on your skin by washing with CHG (chlorahexidine gluconate) soap before surgery.  CHG is an antiseptic cleaner which kills germs and bonds with the skin to continue killing germs even after washing. ?Please DO NOT use if you have an allergy to CHG or antibacterial soaps.  If your skin becomes reddened/irritated stop using the CHG and inform your nurse when you arrive at Short Stay. ?Do not shave (including legs and underarms) for at least 48 hours prior to the first CHG shower.  You may shave your face/neck. ? ?Please follow these instructions carefully: ? 1.  Shower with CHG Soap the night before surgery and the  morning of surgery. ? 2.  If you choose to wash your  hair, wash your hair first as usual with your normal  shampoo. ? 3.  After you shampoo, rinse your hair and body thoroughly to remove the shampoo.                            ? 4.  Use CHG as you would any other liquid soap.  You can apply chg directly to the skin and wash.  Gently with a scrungie or clean washcloth. ? 5.  Apply the CHG Soap to your body ONLY FROM THE NECK DOWN.   Do   not use on face/ open      ?                     Wound or open sores. Avoid contact with eyes, ears mouth and   genitals (private parts).  ?                     Production manager,  Genitals (private parts) with your normal soap. ?            6.  Wash thoroughly, paying special attention to the area where your    surgery  will be performed. ? 7.  Thoroughly rinse your body with warm water from the neck down. ? 8.  DO NOT shower/wash with your normal soap after using and rinsing off the CHG Soap. ?               9.  Pat yourself dry with a clean towel. ?  10.  Wear clean pajamas. ?           11.  Place clean sheets on your bed the night of your first shower and do not  sleep with pets. ?Day of Surgery : ?Do not apply any lotions/deodorants the morning of surgery.  Please wear clean clothes to the hospital/surgery center. ? ?FAILURE TO FOLLOW THESE INSTRUCTIONS MAY RESULT IN THE CANCELLATION OF YOUR SURGERY ? ?PATIENT SIGNATURE_________________________________ ? ?NURSE SIGNATURE__________________________________ ? ?________________________________________________________________________  ?  ?

## 2022-03-09 ENCOUNTER — Encounter (HOSPITAL_COMMUNITY): Payer: Self-pay

## 2022-03-09 ENCOUNTER — Encounter (HOSPITAL_COMMUNITY)
Admission: RE | Admit: 2022-03-09 | Discharge: 2022-03-09 | Disposition: A | Discharge status: Home / Self Care | Payer: Medicare Other | Source: Ambulatory Visit | Attending: Urology | Admitting: Urology

## 2022-03-09 ENCOUNTER — Other Ambulatory Visit: Payer: Self-pay

## 2022-03-09 VITALS — BP 177/106 | HR 88 | Temp 97.8°F | Resp 20 | Ht 62.0 in | Wt 120.8 lb

## 2022-03-09 DIAGNOSIS — D494 Neoplasm of unspecified behavior of bladder: Secondary | ICD-10-CM | POA: Insufficient documentation

## 2022-03-09 DIAGNOSIS — I130 Hypertensive heart and chronic kidney disease with heart failure and stage 1 through stage 4 chronic kidney disease, or unspecified chronic kidney disease: Secondary | ICD-10-CM | POA: Diagnosis not present

## 2022-03-09 DIAGNOSIS — Z01812 Encounter for preprocedural laboratory examination: Secondary | ICD-10-CM | POA: Diagnosis not present

## 2022-03-09 DIAGNOSIS — I429 Cardiomyopathy, unspecified: Secondary | ICD-10-CM | POA: Insufficient documentation

## 2022-03-09 DIAGNOSIS — I2510 Atherosclerotic heart disease of native coronary artery without angina pectoris: Secondary | ICD-10-CM | POA: Insufficient documentation

## 2022-03-09 DIAGNOSIS — N183 Chronic kidney disease, stage 3 unspecified: Secondary | ICD-10-CM | POA: Insufficient documentation

## 2022-03-09 DIAGNOSIS — I493 Ventricular premature depolarization: Secondary | ICD-10-CM | POA: Diagnosis not present

## 2022-03-09 DIAGNOSIS — Z01818 Encounter for other preprocedural examination: Secondary | ICD-10-CM

## 2022-03-09 DIAGNOSIS — I4891 Unspecified atrial fibrillation: Secondary | ICD-10-CM | POA: Insufficient documentation

## 2022-03-09 LAB — CBC
HCT: 49.2 % — ABNORMAL HIGH (ref 36.0–46.0)
Hemoglobin: 15.6 g/dL — ABNORMAL HIGH (ref 12.0–15.0)
MCH: 32.2 pg (ref 26.0–34.0)
MCHC: 31.7 g/dL (ref 30.0–36.0)
MCV: 101.7 fL — ABNORMAL HIGH (ref 80.0–100.0)
Platelets: 156 10*3/uL (ref 150–400)
RBC: 4.84 MIL/uL (ref 3.87–5.11)
RDW: 13.3 % (ref 11.5–15.5)
WBC: 5.6 10*3/uL (ref 4.0–10.5)
nRBC: 0 % (ref 0.0–0.2)

## 2022-03-09 LAB — BASIC METABOLIC PANEL
Anion gap: 7 (ref 5–15)
BUN: 26 mg/dL — ABNORMAL HIGH (ref 8–23)
CO2: 23 mmol/L (ref 22–32)
Calcium: 9.1 mg/dL (ref 8.9–10.3)
Chloride: 112 mmol/L — ABNORMAL HIGH (ref 98–111)
Creatinine, Ser: 1.1 mg/dL — ABNORMAL HIGH (ref 0.44–1.00)
GFR, Estimated: 48 mL/min — ABNORMAL LOW (ref >60)
Glucose, Bld: 90 mg/dL (ref 70–99)
Potassium: 5 mmol/L (ref 3.5–5.1)
Sodium: 142 mmol/L (ref 135–145)

## 2022-03-10 NOTE — Progress Notes (Signed)
Anesthesia Chart Review   Case: [id number] Date/Time: 03/17/22 1145   Procedure: CYSTOSCOPY WITH BIOPSY VERSUS TRANSURETHRAL RESECTION OF BLADDER TUMOR   Anesthesia type: General   Pre-op diagnosis: BLADDER TUMOR   Location: [place] / [facility]   Surgeons: Raynelle Bring, MD       DISCUSSION:88 y.o. never smoker with h/o HTN, cardiomyopathy, atrial fibrillation, PVCs, moderate MR/TR, CKD Stage III, bladder tumor scheduled for above procedure 03/17/2022 with Dr. Raynelle Bring.   Pt last seen by cardiology 02/18/2022. Per OV note, "Preoperative Cardiovascular Risk Assessment: The patient affirms she has been doing well without any new cardiac symptoms. They are able to achieve > 4 METS without cardiac limitations. RCRI: low risk for noncardiac surgery.  She may hold Xarelto 3 days prior to her procedure with recommendation for this medication to be resumed as soon as safely possible post procedure ot minimize interruption in anticoagulation.  Therefore, based on ACC/AHA guidelines, the patient would be at acceptable risk for the planned procedure without further cardiovascular testing. The patient was advised that if she develops new symptoms prior to surgery to contact our office to arrange for a follow-up visit, and she verbalized understanding."  Anticipate pt can proceed with planned procedure barring acute status change.   VS: BP (!) 177/106   Pulse 88   Temp 36.6 C (Oral)   Resp 20   Ht _0  (1.575 m)   Wt 54.8 kg   SpO2 97%   BMI 22.09 kg/m   PROVIDERS: Algis Greenhouse, MD is PCP   Cardiologist - Gwyndolyn Kaufman, MD  LABS: Labs reviewed: Acceptable for surgery. (all labs ordered are listed, but only abnormal results are displayed)  Labs Reviewed  BASIC METABOLIC PANEL - Abnormal; Notable for the following components:      Result Value   Chloride 112 (*)    BUN 26 (*)    Creatinine, Ser 1.10 (*)    GFR, Estimated 48 (*)    All other components within normal limits   CBC - Abnormal; Notable for the following components:   Hemoglobin 15.6 (*)    HCT 49.2 (*)    MCV 101.7 (*)    All other components within normal limits     IMAGES:   EKG: 04/13/2021 Rate 75 bpm  A-fib  CV: Myocardial Perfusion 04/01/2020 Nuclear stress EF: 61%. There was no ST segment deviation noted during stress. The left ventricular ejection fraction is normal (55-65%). The study is normal. This is a low risk study. No ischemia.  Echo 08/24/20  1. Left ventricular ejection fraction, by estimation, is 45 to 50%. The  left ventricle has mildly decreased function. The left ventricle  demonstrates regional wall motion abnormalities (see scoring  diagram/findings for description). Left ventricular  diastolic function could not be evaluated. There is dyskinesis of the left  ventricular, entire anteroseptal wall.   2. Right ventricular systolic function is mildly reduced. The right  ventricular size is mildly enlarged. There is mildly elevated pulmonary  artery systolic pressure.   3. Left atrial size was moderately dilated.   4. Right atrial size was severely dilated.   5. The mitral valve is normal in structure. Mild to moderate mitral valve  regurgitation. No evidence of mitral stenosis. Moderate mitral annular  calcification.   6. Tricuspid valve regurgitation is moderate.   7. The aortic valve is tricuspid. There is moderate calcification of the  aortic valve. There is moderate thickening of the aortic valve. Aortic  valve regurgitation is mild. Mild to moderate aortic valve  sclerosis/calcification is present, without any  evidence of aortic stenosis.   8. The inferior vena cava is dilated in size with >50% respiratory  variability, suggesting right atrial pressure of 8 mmHg. Past Medical History:  Diagnosis Date   Cardiomyopathy ([place])    Echo 1/19: mild LVH, EF 40-45, ant-sept DK, mild AI, MAC, mod MR, severe BAE, mod TR   Chronic atrial fibrillation (HCC)     Chronic systolic CHF (congestive heart failure) ([place])    a. dx 10/2017 but sx dating back to 08/2017.   CKD (chronic kidney disease), stage III (HCC)    a. ? CKD - baseline Cr 0.8-1.1.   Dysrhythmia    Heart murmur    Hx of colonic polyps    Hyperlipidemia    Hypertension    Hypothyroidism    Mild carotid artery disease ([place])    a. Duplex 2014 mild carotid plaque 0-39% BICA.   Moderate mitral regurgitation 10/2017   Moderate tricuspid regurgitation 10/2017   PVC's (premature ventricular contractions)    Uterine cancer (HCC)    Varicose veins of leg with pain, bilateral     Past Surgical History:  Procedure Laterality Date   ABDOMINAL HYSTERECTOMY     ABDOMINAL HYSTERECTOMY     APPENDECTOMY     CATARACT EXTRACTION W/ INTRAOCULAR LENS IMPLANT Bilateral    COLON SURGERY     CYSTOSCOPY  10/01/2020   Procedure: CYSTOSCOPY;  Surgeon: Raynelle Bring, MD;  Location: [facility];  Service: Urology;;   CYSTOSCOPY W/ URETERAL STENT PLACEMENT Left 08/13/2020   Procedure: CYSTOSCOPY WITH TRANSURETHRAL RESECTION OF BLADDER/URETERAL TUMOR;  Surgeon: Raynelle Bring, MD;  Location[facility];  Service: Urology;  Laterality: Left;   EYE SURGERY     cataract surgery bilat with lens implants   PARTIAL COLECTOMY     ROBOT ASSITED LAPAROSCOPIC NEPHROURETERECTOMY Left 10/01/2020   Procedure: XI ROBOT ASSITED LAPAROSCOPIC NEPHROURETERECTOMY;  Surgeon: Raynelle Bring, MD;  Location: [facility];  Service: Urology;  Laterality: Left;   TONSILLECTOMY      MEDICATIONS:  cholecalciferol (VITAMIN D3) 25 MCG (1000 UNIT) tablet   diltiazem (CARDIZEM CD) 120 MG 24 hr capsule   furosemide (LASIX) 20 MG tablet   levothyroxine (SYNTHROID) 25 MCG tablet   losartan (COZAAR) 25 MG tablet   Rivaroxaban (XARELTO) 15 MG TABS tablet   No current facility-administered medications for this encounter.    Sara Felix Ward, PA-C [facility] [phone]

## 2022-03-10 NOTE — Anesthesia Preprocedure Evaluation (Addendum)
Anesthesia Evaluation  Patient identified by MRN, date of birth, ID band Patient awake    Reviewed: Allergy & Precautions, NPO status , Patient's Chart, lab work & pertinent test results  History of Anesthesia Complications Negative for: history of anesthetic complications  Airway Mallampati: II  TM Distance: >3 FB Neck ROM: Full    Dental  (+) Upper Dentures, Lower Dentures   Pulmonary neg pulmonary ROS,    Pulmonary exam normal        Cardiovascular hypertension, Pt. on medications +CHF  Normal cardiovascular exam+ dysrhythmias Atrial Fibrillation + Valvular Problems/Murmurs MR    '21 TTE - EF 45 to 50%. There is dyskinesis of the left ventricular, entire anteroseptal wall. RV mildly reduced. The right ventricular size is mildly enlarged. There is mildly elevated pulmonary artery systolic pressure. Left atrial size was moderately dilated. Right atrial size was severely dilated. Mild to moderate mitral valve regurgitation. Tricuspid valve regurgitation is moderate. Aortic valve regurgitation is mild. Mild to moderate aortic valve sclerosis/calcification without AS    Neuro/Psych negative neurological ROS  negative psych ROS   GI/Hepatic negative GI ROS, Neg liver ROS,   Endo/Other  Hypothyroidism   Renal/GU CRFRenal disease    Bladder tumor     Musculoskeletal negative musculoskeletal ROS (+)   Abdominal   Peds  Hematology  On xarelto    Anesthesia Other Findings   Reproductive/Obstetrics  Uterine cancer                            Anesthesia Physical Anesthesia Plan  ASA: 3  Anesthesia Plan: General   Post-op Pain Management: Tylenol PO (pre-op)*   Induction: Intravenous  PONV Risk Score and Plan: 3 and Treatment may vary due to age or medical condition and Ondansetron  Airway Management Planned: LMA  Additional Equipment: None  Intra-op Plan:   Post-operative Plan:  Extubation in OR  Informed Consent: I have reviewed the patients History and Physical, chart, labs and discussed the procedure including the risks, benefits and alternatives for the proposed anesthesia with the patient or authorized representative who has indicated his/her understanding and acceptance.     Dental advisory given  Plan Discussed with: CRNA and Anesthesiologist  Anesthesia Plan Comments:       Anesthesia Quick Evaluation

## 2022-03-16 NOTE — H&P (Signed)
CC/HPI: Urothelial carcinoma the left ureter/bladder   Sara Griffin returns today approximately 16 months out from her left nephro ureterectomy. She has not had any noticeable gross hematuria or change in voiding habits. She follows up today for surveillance cystoscopy. She was also supposed undergo surveillance imaging of the chest, abdomen, and pelvis although this has not yet been performed. She otherwise remains in stable overall health.     ALLERGIES: No Allergies    MEDICATIONS: Diltiazem Hcl 120 mg tablet  Furosemide 40 mg tablet  Levothyroxine 25 mcg capsule  Losartan Potassium 25 mg tablet  Vitamin D3  Xarelto 15 mg (42)- 20 mg (9) tablet, dose pack     GU PSH: Bladder Instill AntiCA Agent - 10/01/2020 Complex Uroflow - 08/17/2020 Cystoscopy - 10/13/2021, 06/09/2021, 02/24/2021 Cystoscopy TURBT 2-5 cm - 08/13/2020 Hysterectomy Lap Nephro Ureterectomy, Left - 10/01/2020 Locm 300-'399Mg'$ /Ml Iodine,1Ml - 06/03/2021, 09/03/2020     NON-GU PSH: Colonoscopy Partial colectomy     GU PMH: Left ureteral cancer - 10/13/2021, - 06/09/2021, - 02/24/2021, - 11/10/2020, - 10/06/2020, - 08/25/2020 Bladder Cancer overlapping sites - 06/03/2021, - 02/24/2021, - 11/10/2020, - 09/03/2020, - 08/25/2020 Ureteral obstruction - 08/17/2020, - 07/28/2020 Ureter, left, Neoplasm of uncertain behavior - 93/11/3555 Uterine Cancer, part Unspec      PMH Notes:   1) 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 Biopsy was performed with tumor seen extending out of the left ureteral orifice which revealed high grade, Ta urothelial carcinoma. All visible tumor was removed but the left ureteral orifice could not be definitively identified. Her metastatic evaluation was negative and she proceeded with a left RAL nephroureterectomy on 10/01/20. She received postoperative intravesical gemcitabine.   Diagnosis: High  grade, Ta urothelial carcinoma of the bladder (Initial TUR - Nov 2021)  Diagnosis: pT1 Nx Mx, high grade urothelial carcinoma of the ureter with negative surgical margins   NON-GU PMH: Acute on chronic diastolic (congestive) heart failure Chronic kidney disease, stage 3 unspecified Localized edema Malignant neoplasm of major salivary gland, unspecified Myiasis, unspecified Other fatigue Other specified symptoms and signs involving the circulatory and respiratory systems Polyp of colon Unspecified atrial fibrillation Unspecified hearing loss, unspecified ear    FAMILY HISTORY: 4 sons - Runs in Family Heart Attack - Runs in Family Heart Disease - Runs in Family   SOCIAL HISTORY: Marital Status: Unknown Preferred Language: English; Ethnicity: Not Hispanic Or Latino; Race: White Current Smoking Status: Patient has never smoked.   Tobacco Use Assessment Completed: Used Tobacco in last 30 days? Drinks 1 caffeinated drink per day.    REVIEW OF SYSTEMS:    GU Review Female:   Patient denies frequent urination, hard to postpone urination, burning /pain with urination, get up at night to urinate, leakage of urine, stream starts and stops, trouble starting your stream, have to strain to urinate, and currently pregnant.  Gastrointestinal (Lower):   Patient denies diarrhea and constipation.  Gastrointestinal (Upper):   Patient denies nausea and vomiting.  Constitutional:   Patient denies fever, night sweats, weight loss, and fatigue.  Skin:   Patient denies skin rash/ lesion and itching.  Eyes:   Patient denies blurred vision and double vision.  Ears/ Nose/ Throat:   Patient denies sore throat and sinus problems.  Hematologic/Lymphatic:   Patient denies swollen glands and easy bruising.  Cardiovascular:   Patient denies leg swelling and chest pains.  Respiratory:  Patient denies cough and shortness of breath.  Endocrine:   Patient denies excessive thirst.  Musculoskeletal:   Patient denies  back pain and joint pain.  Neurological:   Patient denies headaches and dizziness.  Psychologic:   Patient denies depression and anxiety.   VITAL SIGNS: None    MULTI-SYSTEM PHYSICAL EXAMINATION:    Constitutional: Well-nourished. No physical deformities. Normally developed. Good grooming.  Respiratory: No labored breathing, no use of accessory muscles. Clear bilaterally.  Cardiovascular: Normal temperature, normal extremity pulses, no swelling, no varicosities. Regular rate and rhythm.      ASSESSMENT:      ICD-10 Details  1 GU:   Left ureteral cancer - C66.2   2   Bladder Cancer overlapping sites - C67.8    PLAN:     1. Urothelial carcinoma of the bladder/left ureter: Her imaging that was scheduled to be done prior to the appointment today will be rescheduled. I would like to see this before proceeding with cystoscopy and biopsy of the suspicious area identified on cystoscopy today. We have discussed that procedure in detail with her and her son. Potential risks, complications, and expected recovery process were discussed. Further recommendations will be pending that result.

## 2022-03-17 ENCOUNTER — Ambulatory Visit (HOSPITAL_COMMUNITY): Payer: Medicare Other | Admitting: Physician Assistant

## 2022-03-17 ENCOUNTER — Encounter (HOSPITAL_COMMUNITY): Payer: Self-pay | Admitting: Urology

## 2022-03-17 ENCOUNTER — Ambulatory Visit (HOSPITAL_BASED_OUTPATIENT_CLINIC_OR_DEPARTMENT_OTHER): Payer: Medicare Other | Admitting: Anesthesiology

## 2022-03-17 ENCOUNTER — Ambulatory Visit (HOSPITAL_COMMUNITY)
Admission: RE | Admit: 2022-03-17 | Discharge: 2022-03-17 | Disposition: A | Discharge status: Home / Self Care | Payer: Medicare Other | Attending: Urology | Admitting: Urology

## 2022-03-17 ENCOUNTER — Encounter (HOSPITAL_COMMUNITY)
Admission: RE | Disposition: A | Discharge status: Home / Self Care | Payer: Self-pay | Source: Home / Self Care | Attending: Urology

## 2022-03-17 DIAGNOSIS — N183 Chronic kidney disease, stage 3 unspecified: Secondary | ICD-10-CM | POA: Insufficient documentation

## 2022-03-17 DIAGNOSIS — E039 Hypothyroidism, unspecified: Secondary | ICD-10-CM | POA: Diagnosis not present

## 2022-03-17 DIAGNOSIS — Z906 Acquired absence of other parts of urinary tract: Secondary | ICD-10-CM | POA: Insufficient documentation

## 2022-03-17 DIAGNOSIS — I130 Hypertensive heart and chronic kidney disease with heart failure and stage 1 through stage 4 chronic kidney disease, or unspecified chronic kidney disease: Secondary | ICD-10-CM | POA: Insufficient documentation

## 2022-03-17 DIAGNOSIS — Z8554 Personal history of malignant neoplasm of ureter: Secondary | ICD-10-CM | POA: Insufficient documentation

## 2022-03-17 DIAGNOSIS — Z8542 Personal history of malignant neoplasm of other parts of uterus: Secondary | ICD-10-CM | POA: Insufficient documentation

## 2022-03-17 DIAGNOSIS — I4891 Unspecified atrial fibrillation: Secondary | ICD-10-CM | POA: Diagnosis not present

## 2022-03-17 DIAGNOSIS — C679 Malignant neoplasm of bladder, unspecified: Secondary | ICD-10-CM | POA: Diagnosis present

## 2022-03-17 DIAGNOSIS — Z905 Acquired absence of kidney: Secondary | ICD-10-CM | POA: Insufficient documentation

## 2022-03-17 DIAGNOSIS — I5033 Acute on chronic diastolic (congestive) heart failure: Secondary | ICD-10-CM | POA: Diagnosis not present

## 2022-03-17 DIAGNOSIS — I5032 Chronic diastolic (congestive) heart failure: Secondary | ICD-10-CM | POA: Insufficient documentation

## 2022-03-17 DIAGNOSIS — C678 Malignant neoplasm of overlapping sites of bladder: Secondary | ICD-10-CM | POA: Insufficient documentation

## 2022-03-17 DIAGNOSIS — Z79899 Other long term (current) drug therapy: Secondary | ICD-10-CM | POA: Diagnosis not present

## 2022-03-17 DIAGNOSIS — D494 Neoplasm of unspecified behavior of bladder: Secondary | ICD-10-CM

## 2022-03-17 HISTORY — PX: FULGURATION OF BLADDER TUMOR: SHX6261

## 2022-03-17 HISTORY — PX: CYSTOSCOPY WITH BIOPSY: SHX5122

## 2022-03-17 SURGERY — CYSTOSCOPY, WITH BIOPSY
Anesthesia: General | Site: Urethra

## 2022-03-17 MED ORDER — LIDOCAINE HCL (PF) 2 % IJ SOLN
INTRAMUSCULAR | Status: AC
  Filled 2022-03-17: qty 5

## 2022-03-17 MED ORDER — ACETAMINOPHEN 500 MG PO TABS
1000.0000 mg | ORAL_TABLET | Freq: Once | ORAL | Status: AC
  Administered 2022-03-17: 1000 mg via ORAL
  Filled 2022-03-17: qty 2

## 2022-03-17 MED ORDER — LIDOCAINE 2% (20 MG/ML) 5 ML SYRINGE
INTRAMUSCULAR | Status: DC | PRN
  Administered 2022-03-17: 60 mg via INTRAVENOUS

## 2022-03-17 MED ORDER — ONDANSETRON HCL 4 MG/2ML IJ SOLN
INTRAMUSCULAR | Status: DC | PRN
  Administered 2022-03-17: 4 mg via INTRAVENOUS

## 2022-03-17 MED ORDER — ONDANSETRON HCL 4 MG/2ML IJ SOLN
INTRAMUSCULAR | Status: AC
  Filled 2022-03-17: qty 2

## 2022-03-17 MED ORDER — ONDANSETRON HCL 4 MG/2ML IJ SOLN: 4.0000 mg | Freq: Once | INTRAMUSCULAR | Status: DC | PRN

## 2022-03-17 MED ORDER — SODIUM CHLORIDE 0.9 % IR SOLN
Status: DC | PRN
  Administered 2022-03-17: 1000 mL

## 2022-03-17 MED ORDER — FENTANYL CITRATE PF 50 MCG/ML IJ SOSY: 25.0000 ug | PREFILLED_SYRINGE | INTRAMUSCULAR | Status: DC | PRN

## 2022-03-17 MED ORDER — CEFAZOLIN SODIUM-DEXTROSE 2-4 GM/100ML-% IV SOLN
2.0000 g | Freq: Once | INTRAVENOUS | Status: AC
  Administered 2022-03-17: 2 g via INTRAVENOUS
  Filled 2022-03-17: qty 100

## 2022-03-17 MED ORDER — CHLORHEXIDINE GLUCONATE 0.12 % MT SOLN
15.0000 mL | Freq: Once | OROMUCOSAL | Status: AC
  Administered 2022-03-17: 15 mL via OROMUCOSAL

## 2022-03-17 MED ORDER — OXYCODONE HCL 5 MG PO TABS: 5.0000 mg | ORAL_TABLET | Freq: Once | ORAL | Status: DC | PRN

## 2022-03-17 MED ORDER — STERILE WATER FOR IRRIGATION IR SOLN
Status: DC | PRN
  Administered 2022-03-17: 500 mL

## 2022-03-17 MED ORDER — PROPOFOL 10 MG/ML IV BOLUS
INTRAVENOUS | Status: DC | PRN
  Administered 2022-03-17: 100 mg via INTRAVENOUS

## 2022-03-17 MED ORDER — ORAL CARE MOUTH RINSE: 15.0000 mL | Freq: Once | OROMUCOSAL | Status: AC

## 2022-03-17 MED ORDER — OXYCODONE HCL 5 MG/5ML PO SOLN: 5.0000 mg | Freq: Once | ORAL | Status: DC | PRN

## 2022-03-17 MED ORDER — PROPOFOL 10 MG/ML IV BOLUS
INTRAVENOUS | Status: AC
  Filled 2022-03-17: qty 20

## 2022-03-17 MED ORDER — LACTATED RINGERS IV SOLN: INTRAVENOUS | Status: DC

## 2022-03-17 SURGICAL SUPPLY — 21 items
BAG DRN RND TRDRP ANRFLXCHMBR (UROLOGICAL SUPPLIES)
BAG URINE DRAIN 2000ML AR STRL (UROLOGICAL SUPPLIES) IMPLANT
BAG URO CATCHER STRL LF (MISCELLANEOUS) ×2 IMPLANT
DRAPE FOOT SWITCH (DRAPES) ×2 IMPLANT
ELECT PT RETURN MONO 15F ADT (MISCELLANEOUS) ×1 IMPLANT
ELECT REM PT RETURN 15FT ADLT (MISCELLANEOUS) ×2 IMPLANT
GLOVE BIOGEL PI IND STRL 7.0 (GLOVE) IMPLANT
GLOVE BIOGEL PI INDICATOR 7.0 (GLOVE) ×2
GLOVE SURG LX 7.5 STRW (GLOVE) ×2
GLOVE SURG LX STRL 7.5 STRW (GLOVE) ×1 IMPLANT
GOWN STRL REUS W/ TWL XL LVL3 (GOWN DISPOSABLE) ×1 IMPLANT
GOWN STRL REUS W/TWL XL LVL3 (GOWN DISPOSABLE) ×6
KIT TURNOVER KIT A (KITS) ×1 IMPLANT
LOOP CUT BIPOLAR 24F LRG (ELECTROSURGICAL) IMPLANT
MANIFOLD NEPTUNE II (INSTRUMENTS) ×2 IMPLANT
PACK CYSTO (CUSTOM PROCEDURE TRAY) ×2 IMPLANT
PENCIL SMOKE EVACUATOR (MISCELLANEOUS) IMPLANT
SYR TOOMEY IRRIG 70ML (MISCELLANEOUS) ×2
SYRINGE TOOMEY IRRIG 70ML (MISCELLANEOUS) IMPLANT
TUBING CONNECTING 10 (TUBING) ×2 IMPLANT
TUBING UROLOGY SET (TUBING) ×1 IMPLANT

## 2022-03-17 NOTE — Transfer of Care (Signed)
Immediate Anesthesia Transfer of Care Note  Patient: [name]  Procedure(s) Performed: CYSTOSCOPY WITH BIOPSY VERSUS TRANSURETHRAL RESECTION OF BLADDER TUMOR (Urethra) FULGURATION OF BLADDER TUMOR (Urethra)  Patient Location: PACU  Anesthesia Type:General  Level of Consciousness: sedated  Airway & Oxygen Therapy: Patient Spontanous Breathing and Patient connected to face mask oxygen  Post-op Assessment: Report given to RN and Post -op Vital signs reviewed and stable  Post vital signs: Reviewed and stable  Last Vitals:  Vitals Value Taken Time  BP    Temp    Pulse    Resp    SpO2      Last Pain:  Vitals:   03/17/22 0926  PainSc: 0-No pain      Patients Stated Pain Goal: 0 (16/10/96 0454)  Complications: No notable events documented.

## 2022-03-17 NOTE — Anesthesia Postprocedure Evaluation (Signed)
Anesthesia Post Note  Patient: [name]  Procedure(s) Performed: CYSTOSCOPY WITH BIOPSY AND TRANSURETHRAL RESECTION OF BLADDER TUMOR (Urethra) FULGURATION OF BLADDER TUMOR (Urethra)     Patient location during evaluation: PACU Anesthesia Type: General Level of consciousness: awake and alert Pain management: pain level controlled Vital Signs Assessment: post-procedure vital signs reviewed and stable Respiratory status: spontaneous breathing, nonlabored ventilation and respiratory function stable Cardiovascular status: stable and blood pressure returned to baseline Anesthetic complications: no   No notable events documented.  Last Vitals:  Vitals:   03/17/22 0926 03/17/22 1055  BP: (!) 157/95 132/70  Pulse: 75 (!) 58  Resp:  16  Temp:  36.4 C  SpO2: 97% 98%    Last Pain:  Vitals:   03/17/22 1055  PainSc: Sara Griffin

## 2022-03-17 NOTE — Discharge Instructions (Addendum)
You may see some blood in the urine and may have some burning with urination for 48-72 hours. You also may notice that you have to urinate more frequently or urgently after your procedure which is normal.  You should call should you develop an inability urinate, fever > 101, persistent nausea and vomiting that prevents you from eating or drinking to stay hydrated.  You may resume your Xarelto in 72 hrs if you are not having blood in the urine.

## 2022-03-17 NOTE — Op Note (Signed)
Preoperative diagnosis:  1.  History of urothelial carcinoma of the left ureter status post nephroureterectomy 2.  Bladder tumor (1.5 cm)  Postoperative diagnosis: 1.  History of urothelial carcinoma of the left ureter status post nephroureterectomy 2.  Bladder tumor (1.5 cm)  Procedures: 1.  Cystoscopy 2.  Transurethral removal of bladder tumor 3.  Fulguration of bladder  Surgeon: Pryor Curia MD  Anesthesia: General  Complications: None  EBL: Minimal  Specimen: Left posterior bladder tumor  Disposition of specimen: Pathology  Intraoperative findings: There was a 1.5 cm erythematous and only slightly papillary but mostly flat lesion in the left posterior bladder just superior and medial to the left ureteral orifice scar.  Procedure: The patient was taken the operating room and general anesthetic was administered.  She was given preoperative antibiotics, placed in the dorsolithotomy position, prepped and draped in the usual sterile fashion.  Next, a preoperative timeout was performed.  Cystourethroscopy was then performed and the bladder was systematically examined with a 30 degree lens.  The entire bladder could be easily visualized with a 30 degree lens.  This revealed a scar in the vicinity of the left ureteral orifice consistent with her prior surgical history.  The right ureteral orifice was in its expected anatomic location and was effluxing clear urine.  There was a 1.5 cm erythematous, flat lesion concerning for possible tumor recurrence in the left posterior bladder as described above.  A cold cup biopsy forceps was then used to transurethrally remove the bladder tumor in its entirety in multiple specimens.  Adequate sampling of the underlying detrusor muscle was obtained.  A Bugbee electrode was then used to fulgurate this area to provide hemostasis.  The bladder was emptied and reinspected and hemostasis appeared excellent.  The patient tolerated the procedure well  without complications.  She was able to be awakened and transferred to recovery in satisfactory condition.

## 2022-03-17 NOTE — Anesthesia Procedure Notes (Signed)
Procedure Name: LMA Insertion Date/Time: 03/17/2022 10:19 AM Performed by: [place] Philis Doke C, CRNA Pre-anesthesia Checklist: Patient identified, Emergency Drugs available, Suction available and Patient being monitored Patient Re-evaluated:Patient Re-evaluated prior to induction Oxygen Delivery Method: Circle system utilized Preoxygenation: Pre-oxygenation with 100% oxygen Induction Type: IV induction Ventilation: Mask ventilation without difficulty LMA: LMA inserted LMA Size: 4.0 Number of attempts: 1 Airway Equipment and Method: Bite block Placement Confirmation: positive ETCO2 Tube secured with: Tape Dental Injury: Teeth and Oropharynx as per pre-operative assessment

## 2022-03-18 ENCOUNTER — Encounter (HOSPITAL_COMMUNITY): Payer: Self-pay | Admitting: Urology

## 2022-03-18 LAB — SURGICAL PATHOLOGY

## 2022-04-11 NOTE — Progress Notes (Unsigned)
Cardiology Office Note:    Date:  04/13/2022   ID:  Sara Griffin, DOB Feb 26, 1930, MRN [id number]  PCP:  Algis Greenhouse, MD   [facility] Providers Cardiologist:  Freada Bergeron, MD Cardiology APP:  Imogene Burn, PA-C  {   Referring MD: Algis Greenhouse, MD    History of Present Illness:    Sara Griffin is a 88 y.o. female with a hx of chronic atrial fibrillation, HFrEF with LVEF 40-45%, HTN, HLD, carotid artery disease, moderate MR/TR, and CKD stave 3 who was previously followed by [name] who now presents to clinic for follow-up.  Per review of the record, Echo 11/10/17 showed mild LVH, EF 40-45%, dyskinesis of anteroseptal myocardium, moderate mitral regurgitation, severely dilated LA/RA, moderate TR. Nuclear stress test 02/2016 negative for ischemia. Repeat TTE in 08/2020 with LVEF 45-50% with persistent anteroseptal dyskinesis, mild RV dysfunction, mild-to-moderate MR, moderate TR. Myoview 03/2020 with LVEF 61%, no ischemia or infarction.  Was last seen as a preop visit by Christell Faith for cystoscopy with biopsy vs transurethral resection of bladder tumor. She was stable at that time and deemed appropriate to proceed without further CV testing.   Underwent transurethral removal of bladder tumor with Dr. Alinda Money on 03/17/22 which she tolerated well.  Today, the patient states that she is feeling more weak and tired lately. This has been progressively worsened over the past 2 years. She is unable to walk to the mailbox and back without feeling tired and needing to take a break. This is much different than a couple of months ago. No associated SOB or chest pain. No lightheadedness, dizziness, syncope. Has trace LE edema that is unchanged. No palpitations. Tolerating xarelto without bleeding issues.   Planned for BCG therapy this summer.   Past Medical History:  Diagnosis Date   Cardiomyopathy ([place])    Echo 1/19: mild LVH, EF 40-45, ant-sept DK, mild AI, MAC, mod  MR, severe BAE, mod TR   Chronic atrial fibrillation (HCC)    Chronic systolic CHF (congestive heart failure) ([place])    a. dx 10/2017 but sx dating back to 08/2017.   CKD (chronic kidney disease), stage III (HCC)    a. ? CKD - baseline Cr 0.8-1.1.   Dysrhythmia    Heart murmur    Hx of colonic polyps    Hyperlipidemia    Hypertension    Hypothyroidism    Mild carotid artery disease ([place])    a. Duplex 2014 mild carotid plaque 0-39% BICA.   Moderate mitral regurgitation 10/2017   Moderate tricuspid regurgitation 10/2017   PVC's (premature ventricular contractions)    Uterine cancer (HCC)    Varicose veins of leg with pain, bilateral     Past Surgical History:  Procedure Laterality Date   ABDOMINAL HYSTERECTOMY     ABDOMINAL HYSTERECTOMY     APPENDECTOMY     CATARACT EXTRACTION W/ INTRAOCULAR LENS IMPLANT Bilateral    COLON SURGERY     CYSTOSCOPY  10/01/2020   Procedure: CYSTOSCOPY;  Surgeon: Raynelle Bring, MD;  Location: [facility];  Service: Urology;;   CYSTOSCOPY W/ URETERAL STENT PLACEMENT Left 08/13/2020   Procedure: CYSTOSCOPY WITH TRANSURETHRAL RESECTION OF BLADDER/URETERAL TUMOR;  Surgeon: Raynelle Bring, MD;  Location: [facility];  Service: Urology;  Laterality: Left;   CYSTOSCOPY WITH BIOPSY N/A 03/17/2022   Procedure: CYSTOSCOPY WITH BIOPSY AND TRANSURETHRAL RESECTION OF BLADDER TUMOR;  Surgeon: Raynelle Bring, MD;  Location: [facility];  Service: Urology;  Laterality: N/A;  EYE SURGERY     cataract surgery bilat with lens implants   FULGURATION OF BLADDER TUMOR N/A 03/17/2022   Procedure: FULGURATION OF BLADDER TUMOR;  Surgeon: Raynelle Bring, MD;  Location: [facility];  Service: Urology;  Laterality: N/A;   PARTIAL COLECTOMY     ROBOT ASSITED LAPAROSCOPIC NEPHROURETERECTOMY Left 10/01/2020   Procedure: XI ROBOT ASSITED LAPAROSCOPIC NEPHROURETERECTOMY;  Surgeon: Raynelle Bring, MD;  Location: [facility];  Service: Urology;  Laterality: Left;   TONSILLECTOMY      Current  Medications: Current Meds  Medication Sig   cholecalciferol (VITAMIN D3) 25 MCG (1000 UNIT) tablet Take 1,000 Units by mouth daily.   diltiazem (CARDIZEM CD) 120 MG 24 hr capsule TAKE 1 CAPSULE(120 MG) BY MOUTH DAILY   furosemide (LASIX) 20 MG tablet Take 1 tablet (20 mg total) by mouth daily as needed for edema.   levothyroxine (SYNTHROID) 25 MCG tablet Take 25 mcg by mouth daily before breakfast.    losartan (COZAAR) 25 MG tablet Take 25 mg by mouth daily.   Rivaroxaban (XARELTO) 15 MG TABS tablet Take 15 mg by mouth daily with supper.     Allergies:   Tegretol [carbamazepine] and Hydrocod poli-chlorphe poli er   Social History   Socioeconomic History   Marital status: Widowed    Spouse name: Not on file   Number of children: 5   Years of education: Not on file   Highest education level: Not on file  Occupational History   Not on file  Tobacco Use   Smoking status: Never   Smokeless tobacco: Never  Vaping Use   Vaping Use: Never used  Substance and Sexual Activity   Alcohol use: No   Drug use: No   Sexual activity: Not on file  Other Topics Concern   Not on file  Social History Narrative   Not on file   Social Determinants of Health   Financial Resource Strain: Not on file  Food Insecurity: Not on file  Transportation Needs: Not on file  Physical Activity: Not on file  Stress: Not on file  Social Connections: Not on file     Family History: The patient's family history includes Heart attack (age of onset: 80) in her son; Heart attack (age of onset: 17) in her father; Heart disease in an other family member.  ROS:   Please see the history of present illness.    Review of Systems  Constitutional:  Positive for malaise/fatigue. Negative for chills and fever.  HENT:  Positive for hearing loss.   Respiratory:  Negative for shortness of breath.   Cardiovascular:  Negative for chest pain, palpitations, orthopnea, claudication, leg swelling and PND.   Gastrointestinal:  Negative for blood in stool and melena.  Genitourinary:  Negative for hematuria.  Musculoskeletal:  Negative for falls.  Neurological:  Negative for dizziness and loss of consciousness.     EKGs/Labs/Other Studies Reviewed:    The following studies were reviewed today: 2D echo 08/24/2020 IMPRESSIONS   1. Left ventricular ejection fraction, by estimation, is 45 to 50%. The  left ventricle has mildly decreased function. The left ventricle  demonstrates regional wall motion abnormalities (see scoring  diagram/findings for description). Left ventricular  diastolic function could not be evaluated. There is dyskinesis of the left  ventricular, entire anteroseptal wall.   2. Right ventricular systolic function is mildly reduced. The right  ventricular size is mildly enlarged. There is mildly elevated pulmonary  artery systolic pressure.   3. Left atrial size  was moderately dilated.   4. Right atrial size was severely dilated.   5. The mitral valve is normal in structure. Mild to moderate mitral valve  regurgitation. No evidence of mitral stenosis. Moderate mitral annular  calcification.   6. Tricuspid valve regurgitation is moderate.   7. The aortic valve is tricuspid. There is moderate calcification of the  aortic valve. There is moderate thickening of the aortic valve. Aortic  valve regurgitation is mild. Mild to moderate aortic valve  sclerosis/calcification is present, without any  evidence of aortic stenosis.   8. The inferior vena cava is dilated in size with >50% respiratory  variability, suggesting right atrial pressure of 8 mmHg.   Comparison(s): Prior images reviewed side by side. Changes from prior  study are noted. EF slightly improved compared to prior, with unchanged  dyskinesis of the anteroseptum.   NST 6/9/2021Study Highlights   Nuclear stress EF: 61%. There was no ST segment deviation noted during stress. The left ventricular ejection fraction  is normal (55-65%). The study is normal. This is a low risk study. No ischemia.   2D echo 2019 - Left ventricle: The cavity size was normal. Wall thickness was    increased in a pattern of mild LVH. Systolic function was mildly    to moderately reduced. The estimated ejection fraction was in the    range of 40% to 45%. There is dyskinesis of the anteroseptal    myocardium.  - Aortic valve: There was mild regurgitation.  - Mitral valve: Calcified annulus. There was moderate    regurgitation.  - Left atrium: The atrium was severely dilated.  - Right atrium: The atrium was severely dilated.  - Tricuspid valve: There was moderate regurgitation.   Impressions:  - Dyskinesis of the septum with overall mild to moderate LV    dysfunction (EF 40); mild LVH; AI; moderate MR; severe biatrial    enlargement; moderate TR.   EKG:  ECG today personally reviewed. Afib, possible anterior infarct pattern (old), HR 74  Recent Labs: 04/13/2021: ALT 15 03/09/2022: BUN 26; Creatinine, Ser 1.10; Hemoglobin 15.6; Platelets 156; Potassium 5.0; Sodium 142  Recent Lipid Panel    Component Value Date/Time   CHOL 219 (H) 04/15/2019 0737   TRIG 103 04/15/2019 0737   HDL 83 04/15/2019 0737   CHOLHDL 2.6 04/15/2019 0737   LDLCALC 115 (H) 04/15/2019 0737     Risk Assessment/Calculations:    CHA2DS2-VASc Score = 6   This indicates a 9.7% annual risk of stroke. The patient's score is based upon: CHF History: 1 HTN History: 1 Diabetes History: 0 Stroke History: 0 Vascular Disease History: 1 Age Score: 2 Gender Score: 1        Physical Exam:    VS:  BP (!) 142/76   Pulse 74   Ht _0  (1.575 m)   Wt 124 lb 6.4 oz (56.4 kg)   SpO2 95%   BMI 22.75 kg/m     Wt Readings from Last 3 Encounters:  04/13/22 124 lb 6.4 oz (56.4 kg)  03/17/22 120 lb 13 oz (54.8 kg)  03/09/22 120 lb 12.8 oz (54.8 kg)     GEN:  Elderly female, comfortable HEENT: Normal NECK: No JVD; No carotid bruits CARDIAC:  Irregularly irregular, 2/6 systolic murmur. No rubs.  RESPIRATORY:  Clear to auscultation without rales, wheezing or rhonchi  ABDOMEN: Soft, non-tender, non-distended MUSCULOSKELETAL:  Trace ankle edema. Warm SKIN: Warm and dry NEUROLOGIC:  Alert and oriented x 3 PSYCHIATRIC:  Normal  affect   ASSESSMENT:    1. DOE (dyspnea on exertion)   2. Chronic combined systolic and diastolic CHF (congestive heart failure) (HCC)   3. Persistent atrial fibrillation ([place])   4. Essential hypertension   5. Mixed hyperlipidemia   6. Ureteral cancer, left ([place])   7. Moderate mitral regurgitation   8. Permanent atrial fibrillation (HCC)    PLAN:    In order of problems listed above:  #Exertional Fatigue: Has been progressively worsening over the past couple of years. She is now unable to make it to the mailbox and back without needing to take a break. No significant SOB or exertional chest pain. Given symptoms and progressive nature, will repeat TTE and myoview to assess further. -Check TTE -Check myoview  #Chronic HF with Mildly Reduced EF: TTE in 08/2020 with LVEF 45-50% with anteroseptal dyskinesis. Clinically euvolemic. Given progressive weakness/fatigue with exertion, will repeat TTE at this time. Notably, patient prefers to minimize medications if able.  -Repeat TTE -Continue lasix 30m  -Continue losartan 267mdaily -Will not add spiro/SGLT2i as patient would like to minimize medications  #Chronic atrial fibrillation: CHADS-vasc 4. On xarelto and diltiazem. Well rate controlled today -Continue xarelto 1533maily -Continue dilt 120m55mily  #HTN: Elevated in the office. Would like to monitor at home prior to adjusting meds. -Continue losartan 25mg44mly -Continue dilt 120mg 25my  #Bladder Tumor: S/p transurethral removal of bladder tumor on 03/17/22. -Follow-up with Dr. BordenAlinda Moneyheduled  #CKD: #History of reneal cell carcinoma s/p nephrectomy: -Follow-up with PCP as  scheduled  #Mild-moderate MR: #Moderate TR: -Repeat TTE as above  #HLD: -Declines statin therapy; very reasonable at her age      Medication Adjustments/Labs and Tests Ordered: Current medicines are reviewed at length with the patient today.  Concerns regarding medicines are outlined above.  Orders Placed This Encounter  Procedures   MYOCARDIAL PERFUSION IMAGING   EKG 12-Lead   ECHOCARDIOGRAM COMPLETE   No orders of the defined types were placed in this encounter.   Patient Instructions  Medication Instructions:   Your physician recommends that you continue on your current medications as directed. Please refer to the Current Medication list given to you today.  *If you need a refill on your cardiac medications before your next appointment, please call your pharmacy*  Testing/Procedures:  Your physician has requested that you have an echocardiogram. Echocardiography is a painless test that uses sound waves to create images of your heart. It provides your doctor with information about the size and shape of your heart and how well your heart's chambers and valves are working. This procedure takes approximately one hour. There are no restrictions for this procedure.  Your physician has requested that you have a lexiscan myoview. For further information please visit [email or web address] follow instruction sheet, as given.   Follow-Up: [facility] are our [facility].  As part of our continuing mission to provide you with exceptional heart care, we have created designated Provider Care Teams.  These Care Teams include your primary Cardiologist (physician) and Advanced Practice Providers (APPs -  Physician Assistants and Nurse Practitioners) who all work together to provide you with the care you need, when you need it.  We recommend signing up for the patient portal called "MyChart".  Sign up information is provided on this After Visit Summary.   MyChart is used to connect with patients for Virtual Visits (Telemedicine).  Patients are able to view lab/test results, encounter notes,  upcoming appointments, etc.  Non-urgent messages can be sent to your provider as well.   To learn more about what you can do with MyChart, go to [email or web address].    Your next appointment:   6 month(s)  The format for your next appointment:   In Person  Provider:   Freada Bergeron, MD {  Important Information About Sugar         Signed, Freada Bergeron, MD  04/13/2022 9:41 AM    [place]

## 2022-04-13 ENCOUNTER — Encounter: Payer: Self-pay | Admitting: Cardiology

## 2022-04-13 ENCOUNTER — Telehealth (HOSPITAL_COMMUNITY): Payer: Self-pay | Admitting: Radiology

## 2022-04-13 ENCOUNTER — Encounter: Payer: Self-pay | Admitting: *Deleted

## 2022-04-13 ENCOUNTER — Ambulatory Visit (INDEPENDENT_AMBULATORY_CARE_PROVIDER_SITE_OTHER): Payer: Medicare Other | Admitting: Cardiology

## 2022-04-13 VITALS — BP 142/76 | HR 74 | Ht 62.0 in | Wt 124.4 lb

## 2022-04-13 DIAGNOSIS — E782 Mixed hyperlipidemia: Secondary | ICD-10-CM

## 2022-04-13 DIAGNOSIS — R0609 Other forms of dyspnea: Secondary | ICD-10-CM | POA: Diagnosis not present

## 2022-04-13 DIAGNOSIS — C662 Malignant neoplasm of left ureter: Secondary | ICD-10-CM

## 2022-04-13 DIAGNOSIS — I10 Essential (primary) hypertension: Secondary | ICD-10-CM

## 2022-04-13 DIAGNOSIS — I4821 Permanent atrial fibrillation: Secondary | ICD-10-CM

## 2022-04-13 DIAGNOSIS — I4819 Other persistent atrial fibrillation: Secondary | ICD-10-CM | POA: Diagnosis not present

## 2022-04-13 DIAGNOSIS — I5042 Chronic combined systolic (congestive) and diastolic (congestive) heart failure: Secondary | ICD-10-CM | POA: Diagnosis not present

## 2022-04-13 DIAGNOSIS — I340 Nonrheumatic mitral (valve) insufficiency: Secondary | ICD-10-CM

## 2022-04-13 NOTE — Telephone Encounter (Signed)
Patient given detailed instructions per Myocardial Perfusion Study Information Sheet for the test on 04/21/2022 at 7:15. Patient notified to arrive 15 minutes early and that it is imperative to arrive on time for appointment to keep from having the test rescheduled.  If you need to cancel or reschedule your appointment, please call the office within 24 hours of your appointment. . Patient verbalized understanding.EHK

## 2022-04-13 NOTE — Patient Instructions (Signed)
Medication Instructions:   Your physician recommends that you continue on your current medications as directed. Please refer to the Current Medication list given to you today.  *If you need a refill on your cardiac medications before your next appointment, please call your pharmacy*  Testing/Procedures:  Your physician has requested that you have an echocardiogram. Echocardiography is a painless test that uses sound waves to create images of your heart. It provides your doctor with information about the size and shape of your heart and how well your heart's chambers and valves are working. This procedure takes approximately one hour. There are no restrictions for this procedure.  Your physician has requested that you have a lexiscan myoview. For further information please visit [email or web address]. Please follow instruction sheet, as given.   Follow-Up: At [facility], you and your health needs are our priority.  As part of our continuing mission to provide you with exceptional heart care, we have created designated Provider Care Teams.  These Care Teams include your primary Cardiologist (physician) and Advanced Practice Providers (APPs -  Physician Assistants and Nurse Practitioners) who all work together to provide you with the care you need, when you need it.  We recommend signing up for the patient portal called "MyChart".  Sign up information is provided on this After Visit Summary.  MyChart is used to connect with patients for Virtual Visits (Telemedicine).  Patients are able to view lab/test results, encounter notes, upcoming appointments, etc.  Non-urgent messages can be sent to your provider as well.   To learn more about what you can do with MyChart, go to [email or web address].    Your next appointment:   6 month(s)  The format for your next appointment:   In Person  Provider:   Freada Bergeron, MD {  Important Information About Sugar

## 2022-04-21 ENCOUNTER — Ambulatory Visit (HOSPITAL_COMMUNITY): Payer: Medicare Other | Attending: Cardiovascular Disease

## 2022-04-21 ENCOUNTER — Ambulatory Visit (HOSPITAL_BASED_OUTPATIENT_CLINIC_OR_DEPARTMENT_OTHER): Payer: Medicare Other

## 2022-04-21 DIAGNOSIS — R0609 Other forms of dyspnea: Secondary | ICD-10-CM

## 2022-04-21 LAB — MYOCARDIAL PERFUSION IMAGING
LV dias vol: 38 mL (ref 46–106)
LV sys vol: 14 mL
Nuc Stress EF: 63 %
Peak HR: 92 {beats}/min
Rest HR: 81 {beats}/min
Rest Nuclear Isotope Dose: 10.9 mCi
SDS: 3
SRS: 1
SSS: 4
ST Depression (mm): 0 mm
Stress Nuclear Isotope Dose: 31.5 mCi
TID: 0.96

## 2022-04-21 LAB — ECHOCARDIOGRAM COMPLETE
AR max vel: 1.48 cm2
AV Area VTI: 1.56 cm2
AV Area mean vel: 1.45 cm2
AV Mean grad: 4.2 mmHg
AV Peak grad: 7.4 mmHg
Ao pk vel: 1.36 m/s
Area-P 1/2: 4.8 cm2
MV M vel: 4.81 m/s
MV Peak grad: 92.5 mmHg
P 1/2 time: 673 msec
S' Lateral: 2.4 cm

## 2022-04-21 MED ORDER — TECHNETIUM TC 99M TETROFOSMIN IV KIT
31.5000 | PACK | Freq: Once | INTRAVENOUS | Status: AC | PRN
  Administered 2022-04-21: 31.5 via INTRAVENOUS

## 2022-04-21 MED ORDER — TECHNETIUM TC 99M TETROFOSMIN IV KIT
10.9000 | PACK | Freq: Once | INTRAVENOUS | Status: AC | PRN
  Administered 2022-04-21: 10.9 via INTRAVENOUS

## 2022-04-21 MED ORDER — REGADENOSON 0.4 MG/5ML IV SOLN
0.4000 mg | Freq: Once | INTRAVENOUS | Status: AC
  Administered 2022-04-21: 0.4 mg via INTRAVENOUS

## 2022-04-25 ENCOUNTER — Telehealth: Payer: Self-pay

## 2022-04-25 NOTE — Telephone Encounter (Signed)
Sara Bergeron, MD  P Cv Div Ch St Triage Her echo looks good. Her pumping function is normal. She has dilation of the top chambers of the heart (atria) which is common in Afib. Her mitral valve leaks a mild amount, her tricuspid valve leaks a moderate amount,  and her aortic valve leaks a mild-to-moderate amount. We will just monitor this going forward but her work-up including her echo and stress test is all reassuring and suggests that her shortness of breath is not from her heart arteries or heart pumping function/valve disease.   Called Patient, and son Sara Griffin answered call.    Mr. Morine permitted by patient to receive information on her behalf.   Information above ( Echo results ) communicated / per Dr. Lemmie Evens. Pemberton's note.    Mr. Paczkowski stated he would share information with his mother.  No concerns or f/u required at this time.

## 2022-09-19 ENCOUNTER — Telehealth: Payer: Self-pay | Admitting: Cardiology

## 2022-09-19 MED ORDER — FUROSEMIDE 20 MG PO TABS: 20.0000 mg | ORAL_TABLET | Freq: Every day | ORAL | 2 refills | Status: DC

## 2022-09-19 NOTE — Telephone Encounter (Signed)
Pt c/o medication issue:  1. Name of Medication: Furosemide 20 mg  2. How are you currently taking this medication (dosage and times per day)? Furosemide 20 mg  3. Are you having a reaction (difficulty breathing--STAT)?   4. What is your medication issue? Congestion in her chest and feet swelling-  He is concerned, because this medicine have expired since July of this GLOV(5643) Is it alright that she takes this medicine? She have an appointment on next Tuesday (09-27-22) with Dr Johney Frame   Pt c/o of Chest Pain: STAT if CP now or developed within 24 hours  1. Are you having CP right now?  Congestion in her chest  2. Are you experiencing any other symptoms (ex. SOB, nausea, vomiting, sweating)?   Swelling in her feet  3. How long have you been experiencing CP?  Congestion been going on for about a week  4. Is your CP continuous or coming and going?  It is constantly  5. Have you taken Nitroglycerin? no ?

## 2022-09-19 NOTE — Telephone Encounter (Signed)
[place], [place] 09/19/2022 10:01 AM Freada Bergeron, MD  Sent: Mon September 19, 2022 12:43 PM  To: Nuala Alpha, LPN         Message  Can we send in a new script for the lasix '20mg'$  daily as an expired med may not work as well?    Can we also arrange for BMET and BNP? We can certainly increase her dose as needed.    If things are worse, we can see if APP or DOD has sooner appointment.     Left the pts son a message to call the office back.  Went ahead and sent in lasix 20 mg po daily to her pharmacy on file.  Will await the pts son to call back to further discuss a plan.

## 2022-09-19 NOTE — Progress Notes (Unsigned)
Office Visit    Patient Name: Sara Griffin Date of Encounter: 09/20/2022  PCP:  Sara Greenhouse, MD   [place]  Cardiologist:  Sara Bergeron, MD  Advanced Practice Provider:  Imogene Burn, PA-C Electrophysiologist:  None    HPI    Sara Griffin is a 88 y.o. female with a past medical history of chronic atrial fibrillation, HFrEF with LVEF 40 to 45%, hypertension, hyperlipidemia, carotid artery disease, moderate MR/TR and CKD stage III who was previously followed by Sara Griffin presents today for follow-up appointment.  Echocardiogram 11/10/2017 showed mild LVH, EF 40 to 45%, dyskinesis of anterior septal myocardium, moderate mitral regurgitation, severely dilated LA/RA, moderate TR.  Nuclear stress test 5/17 negative for ischemia.  Repeat TTE 11/21 with LVEF 45 to 50% with persistent anterior septal dyskinesis, mild RV dysfunction, mild to moderate MR, moderate TR.  Myoview 03/2020 with LVEF 61%, no ischemia or infarction.  She was seen by Sara Faith, PA for cystoscopy with biopsy versus transurethral resection of bladder tumor.  She was stable at that time and deemed appropriate to proceed with further CV testing.  Underwent transurethral removal of bladder tumor with Dr. Alinda Griffin 5/23 which she tolerated well  She was last seen 03/2022 and was feeling more weak and tired as of late.  This has been progressively worsening over the last 2 years.  She was unable to walk to the mailbox and back without feeling and needing to take a break.  This is much different than a couple of months ago.  No associated SOB or chest pain.  No lightheadedness, dizziness, syncope.  Had trace LE edema which was unchanged.  No palpitations.  Tolerating Xarelto without any bleeding issues.  Today, she is having some shortness of breath today with cough and some chest pain.  She has been taking to 20 mg of Lasix tabs.  She tells me that she has bladder cancer and she is  getting treatments in the means of immunotherapy.  She will have 6 treatments total.  We discussed taking Lasix 20 mg daily and getting a BMP today.  She is in atrial fibrillation today but she is asymptomatic.  She has tolerated her Xarelto without any major bleeding issues.  Blood pressure is well controlled today.   Reports no palpitations.    Past Medical History    Past Medical History:  Diagnosis Date   Cardiomyopathy ([place])    Echo 1/19: mild LVH, EF 40-45, ant-sept DK, mild AI, MAC, mod MR, severe BAE, mod TR   Chronic atrial fibrillation (HCC)    Chronic systolic CHF (congestive heart failure) ([place])    a. dx 10/2017 but sx dating back to 08/2017.   CKD (chronic kidney disease), stage III (HCC)    a. ? CKD - baseline Cr 0.8-1.1.   Dysrhythmia    Heart murmur    Hx of colonic polyps    Hyperlipidemia    Hypertension    Hypothyroidism    Mild carotid artery disease ([place])    a. Duplex 2014 mild carotid plaque 0-39% BICA.   Moderate mitral regurgitation 10/2017   Moderate tricuspid regurgitation 10/2017   PVC's (premature ventricular contractions)    Uterine cancer (HCC)    Varicose veins of leg with pain, bilateral    Past Surgical History:  Procedure Laterality Date   ABDOMINAL HYSTERECTOMY     ABDOMINAL HYSTERECTOMY     APPENDECTOMY     CATARACT EXTRACTION W/  INTRAOCULAR LENS IMPLANT Bilateral    COLON SURGERY     CYSTOSCOPY  10/01/2020   Procedure: CYSTOSCOPY;  Surgeon: Sara Bring, MD;  Location: [facility];  Service: Urology;;   CYSTOSCOPY W/ URETERAL STENT PLACEMENT Left 08/13/2020   Procedure: CYSTOSCOPY WITH TRANSURETHRAL RESECTION OF BLADDER/URETERAL TUMOR;  Surgeon: Sara Bring, MD;  Location: [facility];  Service: Urology;  Laterality: Left;   CYSTOSCOPY WITH BIOPSY N/A 03/17/2022   Procedure: CYSTOSCOPY WITH BIOPSY AND TRANSURETHRAL RESECTION OF BLADDER TUMOR;  Surgeon: Sara Bring, MD;  Location: [facility];  Service: Urology;  Laterality: N/A;   EYE SURGERY      cataract surgery bilat with lens implants   FULGURATION OF BLADDER TUMOR N/A 03/17/2022   Procedure: FULGURATION OF BLADDER TUMOR;  Surgeon: Sara Bring, MD;  Location: [facility];  Service: Urology;  Laterality: N/A;   PARTIAL COLECTOMY     ROBOT ASSITED LAPAROSCOPIC NEPHROURETERECTOMY Left 10/01/2020   Procedure: XI ROBOT ASSITED LAPAROSCOPIC NEPHROURETERECTOMY;  Surgeon: Sara Bring, MD;  Location: [facility];  Service: Urology;  Laterality: Left;   TONSILLECTOMY      Allergies  Allergies  Allergen Reactions   Tegretol [Carbamazepine]     Almost comatose   Hydrocod Poli-Chlorphe Poli Er Other (See Comments)    Felt bad     EKGs/Labs/Other Studies Reviewed:   The following studies were reviewed today: 2D echo 08/24/2020 IMPRESSIONS   1. Left ventricular ejection fraction, by estimation, is 45 to 50%. The  left ventricle has mildly decreased function. The left ventricle  demonstrates regional wall motion abnormalities (see scoring  diagram/findings for description). Left ventricular  diastolic function could not be evaluated. There is dyskinesis of the left  ventricular, entire anteroseptal wall.   2. Right ventricular systolic function is mildly reduced. The right  ventricular size is mildly enlarged. There is mildly elevated pulmonary  artery systolic pressure.   3. Left atrial size was moderately dilated.   4. Right atrial size was severely dilated.   5. The mitral valve is normal in structure. Mild to moderate mitral valve  regurgitation. No evidence of mitral stenosis. Moderate mitral annular  calcification.   6. Tricuspid valve regurgitation is moderate.   7. The aortic valve is tricuspid. There is moderate calcification of the  aortic valve. There is moderate thickening of the aortic valve. Aortic  valve regurgitation is mild. Mild to moderate aortic valve  sclerosis/calcification is present, without any  evidence of aortic stenosis.   8. The inferior vena cava is  dilated in size with >50% respiratory  variability, suggesting right atrial pressure of 8 mmHg.   Comparison(s): Prior images reviewed side by side. Changes from prior  study are noted. EF slightly improved compared to prior, with unchanged  dyskinesis of the anteroseptum.   NST 6/9/2021Study Highlights   Nuclear stress EF: 61%. There was no ST segment deviation noted during stress. The left ventricular ejection fraction is normal (55-65%). The study is normal. This is a low risk study. No ischemia.   2D echo 2019 - Left ventricle: The cavity size was normal. Wall thickness was    increased in a pattern of mild LVH. Systolic function was mildly    to moderately reduced. The estimated ejection fraction was in the    range of 40% to 45%. There is dyskinesis of the anteroseptal    myocardium.  - Aortic valve: There was mild regurgitation.  - Mitral valve: Calcified annulus. There was moderate    regurgitation.  - Left  atrium: The atrium was severely dilated.  - Right atrium: The atrium was severely dilated.  - Tricuspid valve: There was moderate regurgitation.   Impressions:  - Dyskinesis of the septum with overall mild to moderate LV    dysfunction (EF 40); mild LVH; AI; moderate MR; severe biatrial    enlargement; moderate TR.   EKG:  EKG is  ordered today.  The ekg ordered today demonstrates atrial fibrillation, rate 80 bpm  Recent Labs: 03/09/2022: BUN 26; Creatinine, Ser 1.10; Hemoglobin 15.6; Platelets 156; Potassium 5.0; Sodium 142  Recent Lipid Panel    Component Value Date/Time   CHOL 219 (H) 04/15/2019 0737   TRIG 103 04/15/2019 0737   HDL 83 04/15/2019 0737   CHOLHDL 2.6 04/15/2019 0737   LDLCALC 115 (H) 04/15/2019 0737    Risk Assessment/Calculations:   CHA2DS2-VASc Score = 6   This indicates a 9.7% annual risk of stroke. The patient's score is based upon: CHF History: 1 HTN History: 1 Diabetes History: 0 Stroke History: 0 Vascular Disease History:  1 Age Score: 2 Gender Score: 1     Home Medications   Current Meds  Medication Sig   diltiazem (CARDIZEM CD) 120 MG 24 hr capsule TAKE 1 CAPSULE(120 MG) BY MOUTH DAILY   furosemide (LASIX) 20 MG tablet Take 1 tablet (20 mg total) by mouth daily.   levothyroxine (SYNTHROID) 25 MCG tablet Take 25 mcg by mouth daily before breakfast.    losartan (COZAAR) 25 MG tablet Take 25 mg by mouth daily.   Rivaroxaban (XARELTO) 15 MG TABS tablet Take 15 mg by mouth daily with supper.     Review of Systems      All other systems reviewed and are otherwise negative except as noted above.  Physical Exam    VS:  BP 138/70   Pulse 80   Ht _0  (1.575 m)   Wt 122 lb 3.2 oz (55.4 kg)   SpO2 93%   BMI 22.35 kg/m  , BMI Body mass index is 22.35 kg/m.  Wt Readings from Last 3 Encounters:  09/20/22 122 lb 3.2 oz (55.4 kg)  04/21/22 124 lb (56.2 kg)  04/13/22 124 lb 6.4 oz (56.4 kg)     GEN: Well nourished, well developed, in no acute distress. HEENT: normal. Neck: Supple, no JVD, carotid bruits, or masses. Cardiac: irregular irregular, no murmurs, rubs, or gallops. No clubbing, cyanosis, + bilateral edema.  Radials/PT 2+ and equal bilaterally.  Respiratory:  Respirations regular and unlabored, clear to auscultation bilaterally. GI: Soft, nontender, nondistended. MS: No deformity or atrophy. Skin: Warm and dry, no rash. Neuro:  Strength and sensation are intact. Psych: Normal affect.  Assessment & Plan    1.  DOE/chronic combined systolic and diastolic CHF -BMP today and increase to lasix 1m for 5 days -follow-up BMP -update echocardiogram  3. Persistent atrial fibrillation -Asymptomatic at this time -She will continue her Xarelto 15 mg daily -No major bleeding from anticoagulation  4. Essential hypertension -Well-controlled today, 138/70 -Continue current medication regimen  5. Mixed hyperlipidemia -will need repeat lipid panel at next PCP appointment  7. Moderate mitral  regurgitation -update echo         Disposition: Follow up 3 months with HFreada Bergeron MD or APP.  Signed, TElgie Collard PA-C 09/20/2022, 5:16 PM [facility]

## 2022-09-19 NOTE — Telephone Encounter (Signed)
Spoke with the pts son Sara Griffin (on DPR) on the phone.   He would prefer the pt be seen sometime this week with an APP vs next Tuesday with Dr. Johney Frame, due to her complaints bilateral lower extremity edema and chest congestion and due to pt going in for a procedure on next Monday and allowing her to recover from that  thereafter.   Scheduled the pt to come into the office to see Nicholes Rough PA-C for tomorrow 11/28 at 1:05 pm.  Son is aware to have her there 15 mins prior to that appt.  Pts son is aware that I will cancel her appt with Dr. Johney Frame scheduled for next Tuesday.  Did inform the pts son that I sent her in a new script of lasix 20 mg po daily to her pharmacy on file.   Informed the pts son that Johann Capers will draw labs on the pt at tomorrow's office visit, to assess her fluid levels.  Noted this in appt notes.   Advised the pts son to have her wear compressions during the day, elevate her extremities at rest, and have her avoid salt.   Son verbalized understanding and agrees with this plan.  Will make Dr. Johney Frame aware of this plan.

## 2022-09-20 ENCOUNTER — Encounter: Payer: Self-pay | Admitting: Physician Assistant

## 2022-09-20 ENCOUNTER — Ambulatory Visit: Payer: Medicare Other | Attending: Physician Assistant | Admitting: Physician Assistant

## 2022-09-20 VITALS — BP 138/70 | HR 80 | Ht 62.0 in | Wt 122.2 lb

## 2022-09-20 DIAGNOSIS — R0609 Other forms of dyspnea: Secondary | ICD-10-CM

## 2022-09-20 DIAGNOSIS — R0789 Other chest pain: Secondary | ICD-10-CM

## 2022-09-20 DIAGNOSIS — E782 Mixed hyperlipidemia: Secondary | ICD-10-CM

## 2022-09-20 DIAGNOSIS — I5042 Chronic combined systolic (congestive) and diastolic (congestive) heart failure: Secondary | ICD-10-CM | POA: Diagnosis not present

## 2022-09-20 DIAGNOSIS — E785 Hyperlipidemia, unspecified: Secondary | ICD-10-CM

## 2022-09-20 DIAGNOSIS — C662 Malignant neoplasm of left ureter: Secondary | ICD-10-CM | POA: Diagnosis not present

## 2022-09-20 DIAGNOSIS — I4819 Other persistent atrial fibrillation: Secondary | ICD-10-CM

## 2022-09-20 DIAGNOSIS — I10 Essential (primary) hypertension: Secondary | ICD-10-CM

## 2022-09-20 DIAGNOSIS — I340 Nonrheumatic mitral (valve) insufficiency: Secondary | ICD-10-CM

## 2022-09-20 NOTE — Patient Instructions (Signed)
Medication Instructions:  1.Take lasix 20 mg daily for the next 5 days *If you need a refill on your cardiac medications before your next appointment, please call your pharmacy*   Lab Work: BMP today BMP in 1 week If you have labs (blood work) drawn today and your tests are completely normal, you will receive your results only by: [place] (if you have MyChart) OR A paper copy in the mail If you have any lab test that is abnormal or we need to change your treatment, we will call you to review the results.   Testing/Procedures: Your physician has requested that you have an echocardiogram. Echocardiography is a painless test that uses sound waves to create images of your heart. It provides your doctor with information about the size and shape of your heart and how well your heart's chambers and valves are working. This procedure takes approximately one hour. There are no restrictions for this procedure. Please do NOT wear cologne, perfume, aftershave, or lotions (deodorant is allowed). Please arrive 15 minutes prior to your appointment time.    Follow-Up: At [facility], you and your health needs are our priority.  As part of our continuing mission to provide you with exceptional heart care, we have created designated Provider Care Teams.  These Care Teams include your primary Cardiologist (physician) and Advanced Practice Providers (APPs -  Physician Assistants and Nurse Practitioners) who all work together to provide you with the care you need, when you need it.  We recommend signing up for the patient portal called "MyChart".  Sign up information is provided on this After Visit Summary.  MyChart is used to connect with patients for Virtual Visits (Telemedicine).  Patients are able to view lab/test results, encounter notes, upcoming appointments, etc.  Non-urgent messages can be sent to your provider as well.   To learn more about what you can do with MyChart, go to  [email or web address].    Your next appointment:   3-4 week(s)  The format for your next appointment:   In Person  Provider:   Freada Bergeron, MD  or Nicholes Rough, PA-C        Important Information About Sugar

## 2022-09-21 LAB — BASIC METABOLIC PANEL
BUN/Creatinine Ratio: 28 (ref 12–28)
BUN: 29 mg/dL (ref 10–36)
CO2: 22 mmol/L (ref 20–29)
Calcium: 9.3 mg/dL (ref 8.7–10.3)
Chloride: 103 mmol/L (ref 96–106)
Creatinine, Ser: 1.02 mg/dL — ABNORMAL HIGH (ref 0.57–1.00)
Glucose: 90 mg/dL (ref 70–99)
Potassium: 4.1 mmol/L (ref 3.5–5.2)
Sodium: 139 mmol/L (ref 134–144)
eGFR: 52 mL/min/{1.73_m2} — ABNORMAL LOW (ref >59)

## 2022-09-27 ENCOUNTER — Ambulatory Visit: Payer: Medicare Other | Admitting: Cardiology

## 2022-09-28 ENCOUNTER — Other Ambulatory Visit: Payer: Self-pay

## 2022-09-28 DIAGNOSIS — R0609 Other forms of dyspnea: Secondary | ICD-10-CM

## 2022-09-28 DIAGNOSIS — E785 Hyperlipidemia, unspecified: Secondary | ICD-10-CM

## 2022-09-28 DIAGNOSIS — I4819 Other persistent atrial fibrillation: Secondary | ICD-10-CM

## 2022-09-28 DIAGNOSIS — I340 Nonrheumatic mitral (valve) insufficiency: Secondary | ICD-10-CM

## 2022-09-28 DIAGNOSIS — E782 Mixed hyperlipidemia: Secondary | ICD-10-CM

## 2022-09-28 DIAGNOSIS — C662 Malignant neoplasm of left ureter: Secondary | ICD-10-CM

## 2022-09-28 DIAGNOSIS — I10 Essential (primary) hypertension: Secondary | ICD-10-CM

## 2022-09-28 DIAGNOSIS — I5042 Chronic combined systolic (congestive) and diastolic (congestive) heart failure: Secondary | ICD-10-CM

## 2022-09-29 LAB — BASIC METABOLIC PANEL
BUN/Creatinine Ratio: 23 (ref 12–28)
BUN: 25 mg/dL (ref 10–36)
CO2: 23 mmol/L (ref 20–29)
Calcium: 9 mg/dL (ref 8.7–10.3)
Chloride: 102 mmol/L (ref 96–106)
Creatinine, Ser: 1.08 mg/dL — ABNORMAL HIGH (ref 0.57–1.00)
Glucose: 87 mg/dL (ref 70–99)
Potassium: 4.2 mmol/L (ref 3.5–5.2)
Sodium: 141 mmol/L (ref 134–144)
eGFR: 49 mL/min/{1.73_m2} — ABNORMAL LOW (ref >59)

## 2022-10-04 ENCOUNTER — Ambulatory Visit: Payer: Medicare Other | Attending: Cardiology

## 2022-10-04 DIAGNOSIS — R0789 Other chest pain: Secondary | ICD-10-CM | POA: Diagnosis not present

## 2022-10-04 LAB — ECHOCARDIOGRAM COMPLETE
Area-P 1/2: 6.02 cm2
Calc EF: 47.2 %
P 1/2 time: 663 msec
S' Lateral: 3.3 cm
Single Plane A2C EF: 50.7 %
Single Plane A4C EF: 39.2 %

## 2022-10-13 NOTE — Progress Notes (Signed)
Office Visit    Patient Name: Sara Griffin Date of Encounter: 10/14/2022  PCP:  Algis Greenhouse, MD   [place]  Cardiologist:  Freada Bergeron, MD  Advanced Practice Provider:  Imogene Burn, PA-C Electrophysiologist:  None    HPI    Sara Griffin is a 88 y.o. female with a past medical history of chronic atrial fibrillation, HFrEF with LVEF 40 to 45%, hypertension, hyperlipidemia, carotid artery disease, moderate MR/TR and CKD stage III who was previously followed by [name] presents today for follow-up appointment.  Echocardiogram 11/10/2017 showed mild LVH, EF 40 to 45%, dyskinesis of anterior septal myocardium, moderate mitral regurgitation, severely dilated LA/RA, moderate TR.  Nuclear stress test 5/17 negative for ischemia.  Repeat TTE 11/21 with LVEF 45 to 50% with persistent anterior septal dyskinesis, mild RV dysfunction, mild to moderate MR, moderate TR.  Myoview 03/2020 with LVEF 61%, no ischemia or infarction.  She was seen by Christell Faith, PA for cystoscopy with biopsy versus transurethral resection of bladder tumor.  She was stable at that time and deemed appropriate to proceed with further CV testing.  Underwent transurethral removal of bladder tumor with Dr. Alinda Money 5/23 which she tolerated well  She was last seen 03/2022 and was feeling more weak and tired as of late.  This has been progressively worsening over the last 2 years.  She was unable to walk to the mailbox and back without feeling and needing to take a break.  This is much different than a couple of months ago.  No associated SOB or chest pain.  No lightheadedness, dizziness, syncope.  Had trace LE edema which was unchanged.  No palpitations.  Tolerating Xarelto without any bleeding issues.  She was seen by me 11/28 and was having some shortness of breath today with cough and some chest pain.  She has been taking to 20 mg of Lasix tabs.  She tells me that she has bladder  cancer and she is getting treatments in the means of immunotherapy.  She will have 6 treatments total.  We discussed taking Lasix 20 mg daily and getting a BMP today.  She is in atrial fibrillation today but she is asymptomatic.  She has tolerated her Xarelto without any major bleeding issues.  Blood pressure is well controlled today.  Today, she states that her SOB is better today. She doe have some dependent edema which gets better when she elevated her feet at night. She is still struggling with her HOH. She has hearing aids in today. She is only requiring her lasix as needed at this point and has not needed it in over a week. Creatinine has been stable and labs were reviewed today. BP was initally a little elevated but returned to normal but the end of her appointment 130/70.   Reports no shortness of breath nor dyspnea on exertion. Reports no chest pain, pressure, or tightness. No orthopnea, PND. Reports no palpitations.   Past Medical History    Past Medical History:  Diagnosis Date   Cardiomyopathy ([place])    Echo 1/19: mild LVH, EF 40-45, ant-sept DK, mild AI, MAC, mod MR, severe BAE, mod TR   Chronic atrial fibrillation (HCC)    Chronic systolic CHF (congestive heart failure) ([place])    a. dx 10/2017 but sx dating back to 08/2017.   CKD (chronic kidney disease), stage III (HCC)    a. ? CKD - baseline Cr 0.8-1.1.   Dysrhythmia  Heart murmur    Hx of colonic polyps    Hyperlipidemia    Hypertension    Hypothyroidism    Mild carotid artery disease ([place])    a. Duplex 2014 mild carotid plaque 0-39% BICA.   Moderate mitral regurgitation 10/2017   Moderate tricuspid regurgitation 10/2017   PVC's (premature ventricular contractions)    Uterine cancer (HCC)    Varicose veins of leg with pain, bilateral    Past Surgical History:  Procedure Laterality Date   ABDOMINAL HYSTERECTOMY     ABDOMINAL HYSTERECTOMY     APPENDECTOMY     CATARACT EXTRACTION W/ INTRAOCULAR LENS IMPLANT Bilateral     COLON SURGERY     CYSTOSCOPY  10/01/2020   Procedure: CYSTOSCOPY;  Surgeon: Raynelle Bring, MD;  [facility]: [facility];  Service: Urology;;   CYSTOSCOPY W/ URETERAL STENT PLACEMENT Left 08/13/2020   Procedure: CYSTOSCOPY WITH TRANSURETHRAL RESECTION OF BLADDER/URETERAL TUMOR;  Surgeon: Raynelle Bring, MD;  [facility];  Service: Urology;  Laterality: Left;   CYSTOSCOPY WITH BIOPSY N/A 03/17/2022   Procedure: CYSTOSCOPY WITH BIOPSY AND TRANSURETHRAL RESECTION OF BLADDER TUMOR;  Surgeon: Raynelle Bring, MD;  [facility];  Service: Urology;  Laterality: N/A;   EYE SURGERY     cataract surgery bilat with lens implants   FULGURATION OF BLADDER TUMOR N/A 03/17/2022   Procedure: FULGURATION OF BLADDER TUMOR;  Surgeon: Raynelle Bring, MD;  [facility];  Service: Urology;  Laterality: N/A;   PARTIAL COLECTOMY     ROBOT ASSITED LAPAROSCOPIC NEPHROURETERECTOMY Left 10/01/2020   Procedure: XI ROBOT ASSITED LAPAROSCOPIC NEPHROURETERECTOMY;  Surgeon: Raynelle Bring, MD;  [facility];  Service: Urology;  Laterality: Left;   TONSILLECTOMY      Allergies  Allergies  Allergen Reactions   Tegretol [Carbamazepine]     Almost comatose   Hydrocod Poli-Chlorphe Poli Er Other (See Comments)    Felt bad     EKGs/Labs/Other Studies Reviewed:   The following studies were reviewed today:  Echo 10/04/22  IMPRESSIONS     1. GLS -9.0. Left ventricular ejection fraction, by estimation, is 50 to  55%. The left ventricle has low normal function. The left ventricle has no  regional wall motion abnormalities. There is mild left ventricular  hypertrophy. Left ventricular  diastolic parameters are indeterminate.   2. Right ventricular systolic function is normal. The right ventricular  size is normal. There is mildly elevated pulmonary artery systolic  pressure.   3. Left atrial size was severely dilated.   4. Right atrial size was severely dilated.   5. The mitral valve is normal in  structure. Mild to moderate mitral valve  regurgitation. No evidence of mitral stenosis.   6. Tricuspid valve regurgitation is moderate to severe.   7. The aortic valve is calcified. There is moderate calcification of the  aortic valve. Aortic valve regurgitation is mild. No aortic stenosis is  present.   8. The inferior vena cava is normal in size with greater than 50%  respiratory variability, suggesting right atrial pressure of 3 mmHg.   FINDINGS   Left Ventricle: GLS -9.0. Left ventricular ejection fraction, by  estimation, is 50 to 55%. The left ventricle has low normal function. The  left ventricle has no regional wall motion abnormalities. The left  ventricular internal cavity size was normal in  size. There is mild left ventricular hypertrophy. Left ventricular  diastolic parameters are indeterminate.   Right Ventricle: The right ventricular size is normal. No increase in  right ventricular wall thickness. Right ventricular systolic function is  normal. There is mildly elevated pulmonary artery systolic pressure. The  tricuspid regurgitant velocity is 2.87   m/s, and with an assumed right atrial pressure of 8 mmHg, the estimated  right ventricular systolic pressure is 01.7 mmHg.   Left Atrium: Left atrial size was severely dilated.   Right Atrium: Right atrial size was severely dilated.   Pericardium: There is no evidence of pericardial effusion.   Mitral Valve: The mitral valve is normal in structure. Mild to moderate  mitral valve regurgitation. No evidence of mitral valve stenosis.   Tricuspid Valve: The tricuspid valve is normal in structure. Tricuspid  valve regurgitation is moderate to severe. No evidence of tricuspid  stenosis.   Aortic Valve: The aortic valve is calcified. There is moderate  calcification of the aortic valve. Aortic valve regurgitation is mild.  Aortic regurgitation PHT measures 663 msec. No aortic stenosis is present.   Pulmonic Valve: The  pulmonic valve was normal in structure. Pulmonic valve  regurgitation is not visualized. No evidence of pulmonic stenosis.   Aorta: The aortic root is normal in size and structure.   Venous: The inferior vena cava is normal in size with greater than 50%  respiratory variability, suggesting right atrial pressure of 3 mmHg.   IAS/Shunts: No atrial level shunt detected by color flow Doppler.  2D echo 08/24/2020 IMPRESSIONS   1. Left ventricular ejection fraction, by estimation, is 45 to 50%. The  left ventricle has mildly decreased function. The left ventricle  demonstrates regional wall motion abnormalities (see scoring  diagram/findings for description). Left ventricular  diastolic function could not be evaluated. There is dyskinesis of the left  ventricular, entire anteroseptal wall.   2. Right ventricular systolic function is mildly reduced. The right  ventricular size is mildly enlarged. There is mildly elevated pulmonary  artery systolic pressure.   3. Left atrial size was moderately dilated.   4. Right atrial size was severely dilated.   5. The mitral valve is normal in structure. Mild to moderate mitral valve  regurgitation. No evidence of mitral stenosis. Moderate mitral annular  calcification.   6. Tricuspid valve regurgitation is moderate.   7. The aortic valve is tricuspid. There is moderate calcification of the  aortic valve. There is moderate thickening of the aortic valve. Aortic  valve regurgitation is mild. Mild to moderate aortic valve  sclerosis/calcification is present, without any  evidence of aortic stenosis.   8. The inferior vena cava is dilated in size with >50% respiratory  variability, suggesting right atrial pressure of 8 mmHg.   Comparison(s): Prior images reviewed side by side. Changes from prior  study are noted. EF slightly improved compared to prior, with unchanged  dyskinesis of the anteroseptum.   NST 6/9/2021Study Highlights   Nuclear stress EF:  61%. There was no ST segment deviation noted during stress. The left ventricular ejection fraction is normal (55-65%). The study is normal. This is a low risk study. No ischemia.   2D echo 2019 - Left ventricle: The cavity size was normal. Wall thickness was    increased in a pattern of mild LVH. Systolic function was mildly    to moderately reduced. The estimated ejection fraction was in the    range of 40% to 45%. There is dyskinesis of the anteroseptal    myocardium.  - Aortic valve: There was mild regurgitation.  - Mitral valve: Calcified annulus. There was moderate    regurgitation.  -  Left atrium: The atrium was severely dilated.  - Right atrium: The atrium was severely dilated.  - Tricuspid valve: There was moderate regurgitation.   Impressions:  - Dyskinesis of the septum with overall mild to moderate LV    dysfunction (EF 40); mild LVH; AI; moderate MR; severe biatrial    enlargement; moderate TR.   EKG:  EKG is  ordered today.  The ekg ordered today demonstrates atrial fibrillation, rate 80 bpm  Recent Labs: 03/09/2022: Hemoglobin 15.6; Platelets 156 09/28/2022: BUN 25; Creatinine, Ser 1.08; Potassium 4.2; Sodium 141  Recent Lipid Panel    Component Value Date/Time   CHOL 219 (H) 04/15/2019 0737   TRIG 103 04/15/2019 0737   HDL 83 04/15/2019 0737   CHOLHDL 2.6 04/15/2019 0737   LDLCALC 115 (H) 04/15/2019 0737    Risk Assessment/Calculations:   CHA2DS2-VASc Score = 6   This indicates a 9.7% annual risk of stroke. The patient's score is based upon: CHF History: 1 HTN History: 1 Diabetes History: 0 Stroke History: 0 Vascular Disease History: 1 Age Score: 2 Gender Score: 1    Home Medications   Current Meds  Medication Sig   diltiazem (CARDIZEM CD) 120 MG 24 hr capsule TAKE 1 CAPSULE(120 MG) BY MOUTH DAILY   levothyroxine (SYNTHROID) 25 MCG tablet Take 25 mcg by mouth daily before breakfast.    losartan (COZAAR) 25 MG tablet Take 25 mg by mouth daily.    Rivaroxaban (XARELTO) 15 MG TABS tablet Take 15 mg by mouth daily with supper.   [DISCONTINUED] furosemide (LASIX) 20 MG tablet Take 1 tablet (20 mg total) by mouth daily.     Review of Systems      All other systems reviewed and are otherwise negative except as noted above.  Physical Exam    VS:  BP 130/70   Pulse 68   Ht _0  (1.575 m)   Wt 124 lb 3.2 oz (56.3 kg)   SpO2 93%   BMI 22.72 kg/m  , BMI Body mass index is 22.72 kg/m.  Wt Readings from Last 3 Encounters:  10/14/22 124 lb 3.2 oz (56.3 kg)  09/20/22 122 lb 3.2 oz (55.4 kg)  04/21/22 124 lb (56.2 kg)     GEN: Well nourished, well developed, in no acute distress. HEENT: normal. Neck: Supple, no JVD, carotid bruits, or masses. Cardiac: irregular irregular, no murmurs, rubs, or gallops. No clubbing, cyanosis, no edema.  Radials/PT 2+ and equal bilaterally.  Respiratory:  Respirations regular and unlabored, clear to auscultation bilaterally. GI: Soft, nontender, nondistended. MS: No deformity or atrophy. Skin: Warm and dry, no rash. Neuro:  Strength and sensation are intact. Psych: Normal affect.  Assessment & Plan    1.  DOE/chronic combined systolic and diastolic CHF -lasix is now as needed, she is only needing it more than once a week or so -daily weights -compression hose -experiencing some dependent edema but resolves by the morning  3. Persistent atrial fibrillation -Asymptomatic at this time -She will continue her Xarelto 15 mg daily for CHA2DS2-VASc score of 6 -No major bleeding from anticoagulation -benefits outweigh the risks for anticoagulation   4. Essential hypertension -A little high today 152/70, on repeat 130/70 -track BP for me over the next 2-3 weeks and please send those values via MyChart -Continue current medication regimen, Losartan 25 mg daily  5. Mixed hyperlipidemia -will need repeat lipid panel at next PCP appointment  7. Moderate mitral regurgitation/moderate to severe  TR/mild AI -updated echo with stable  results -continue current medication regimen       Disposition: Follow up 6 months with Freada Bergeron, MD or APP.  Signed, Elgie Collard, PA-C 10/14/2022, 11:53 AM [place]

## 2022-10-14 ENCOUNTER — Other Ambulatory Visit: Payer: Self-pay

## 2022-10-14 ENCOUNTER — Ambulatory Visit: Payer: Medicare Other | Attending: Physician Assistant | Admitting: Physician Assistant

## 2022-10-14 ENCOUNTER — Encounter: Payer: Self-pay | Admitting: Physician Assistant

## 2022-10-14 VITALS — BP 130/70 | HR 68 | Ht 62.0 in | Wt 124.2 lb

## 2022-10-14 DIAGNOSIS — E785 Hyperlipidemia, unspecified: Secondary | ICD-10-CM | POA: Diagnosis not present

## 2022-10-14 DIAGNOSIS — I340 Nonrheumatic mitral (valve) insufficiency: Secondary | ICD-10-CM | POA: Diagnosis not present

## 2022-10-14 DIAGNOSIS — I4819 Other persistent atrial fibrillation: Secondary | ICD-10-CM

## 2022-10-14 DIAGNOSIS — R0609 Other forms of dyspnea: Secondary | ICD-10-CM

## 2022-10-14 DIAGNOSIS — I10 Essential (primary) hypertension: Secondary | ICD-10-CM

## 2022-10-14 DIAGNOSIS — I5042 Chronic combined systolic (congestive) and diastolic (congestive) heart failure: Secondary | ICD-10-CM

## 2022-10-14 DIAGNOSIS — I4821 Permanent atrial fibrillation: Secondary | ICD-10-CM

## 2022-10-14 MED ORDER — FUROSEMIDE 20 MG PO TABS: 20.0000 mg | ORAL_TABLET | Freq: Every day | ORAL | 2 refills | Status: DC | PRN

## 2022-10-14 MED ORDER — DILTIAZEM HCL ER COATED BEADS 120 MG PO CP24
ORAL_CAPSULE | ORAL | 0 refills | Status: DC
Start: 2022-10-14 — End: 2023-01-16

## 2022-10-14 NOTE — Patient Instructions (Signed)
Medication Instructions:  1.Take lasix 20 mg only as needed for shortness of breath or lower extremity edema *If you need a refill on your cardiac medications before your next appointment, please call your pharmacy*   Lab Work: None If you have labs (blood work) drawn today and your tests are completely normal, you will receive your results only by: [place] (if you have MyChart) OR A paper copy in the mail If you have any lab test that is abnormal or we need to change your treatment, we will call you to review the results.   Follow-Up: At [facility], you and your health needs are our priority.  As part of our continuing mission to provide you with exceptional heart care, we have created designated Provider Care Teams.  These Care Teams include your primary Cardiologist (physician) and Advanced Practice Providers (APPs -  Physician Assistants and Nurse Practitioners) who all work together to provide you with the care you need, when you need it.   Your next appointment:   6 month(s)  The format for your next appointment:   In Person  Provider:   Freada Bergeron, MD   Other Instructions 1.Wear compression hose daily 2.Weigh every morning after using the restroom, before breakfast and keep a log  3.Check your blood pressure daily, one hour after taking your morning medications for the next two weeks, keep a log and send [place] the readings through mychart  Important Information About Sugar

## 2022-11-16 ENCOUNTER — Ambulatory Visit (INDEPENDENT_AMBULATORY_CARE_PROVIDER_SITE_OTHER): Payer: Medicare Other | Admitting: Allergy and Immunology

## 2022-11-16 ENCOUNTER — Encounter: Payer: Self-pay | Admitting: Allergy and Immunology

## 2022-11-16 VITALS — BP 144/72 | HR 96 | Resp 18 | Ht 60.0 in | Wt 126.0 lb

## 2022-11-16 DIAGNOSIS — T7840XD Allergy, unspecified, subsequent encounter: Secondary | ICD-10-CM

## 2022-11-16 DIAGNOSIS — C679 Malignant neoplasm of bladder, unspecified: Secondary | ICD-10-CM

## 2022-11-16 NOTE — Patient Instructions (Signed)
  1. NO BCG exposure at this point in time  2. Blood - CBC w/d, CMP, C3, C4, SED, CRP, ANA w/R  3. Urine - U/A  4. Further evaluation and treatment???

## 2022-11-16 NOTE — Progress Notes (Unsigned)
[place] - [place] - [place] - [place] - [place]   Dear Dr. [name],  Thank you for referring Sara Griffin to the [facility] on 11/16/2022.   Below is a summation of this patient's evaluation and recommendations.  Thank you for your referral. I will keep you informed about this patient's response to treatment.   If you have any questions please do not hesitate to contact me.   Sincerely,  Jiles Prows, MD Allergy / Immunology [place]   ______________________________________________________________________    NEW PATIENT NOTE  Referring Provider: Algis Greenhouse, MD Primary Provider: Algis Greenhouse, MD Date of office visit: 11/16/2022    Subjective:   Chief Complaint:  Sara Griffin (DOB: [birth year]) is a 89 y.o. female who presents to the clinic on 11/16/2022 with a chief complaint of Medication Reaction .     HPI: Sara Griffin presents to this clinic in evaluation of possible hypersensitivity reaction secondary to intravesicular instillation of BCG.  She is receiving intravesicular instillation of BCG for a bladder cancer.  She had an infusion on 08 November 2021.  Within 72 hours after that exposure she developed very significant hand and feet swelling and pain in her hands and feet and itchiness in her hands and feet and redness of the skin involving her hands and feet.  She had no other associated systemic or constitutional symptoms.  Fortunately, she is much better over the course of the past 48 hours.  Her most current BCG administration was the 6th out of a total of 6 treatments that she has been receiving during this cycle.  She has had a total of 12 exposures since she was diagnosed with her bladder cancer.  There was not really any other obvious provoking factor that may have given rise to her hypersensitivity reaction involving the joints and the skin of her  hands and feet.  She did not start any other new medications, have a significant environmental change, start any new supplements, have symptoms suggesting an ongoing infectious disease over the course of the past month, and overall feels really good except for this problem.  Past Medical History:  Diagnosis Date   Cardiomyopathy ([place])    Echo 1/19: mild LVH, EF 40-45, ant-sept DK, mild AI, MAC, mod MR, severe BAE, mod TR   Chronic atrial fibrillation (HCC)    Chronic systolic CHF (congestive heart failure) ([place])    a. dx 10/2017 but sx dating back to 08/2017.   CKD (chronic kidney disease), stage III (HCC)    a. ? CKD - baseline Cr 0.8-1.1.   Dysrhythmia    Heart murmur    Hx of colonic polyps    Hyperlipidemia    Hypertension    Hypothyroidism    Mild carotid artery disease ([place])    a. Duplex 2014 mild carotid plaque 0-39% BICA.   Moderate mitral regurgitation 10/2017   Moderate tricuspid regurgitation 10/2017   PVC's (premature ventricular contractions)    Uterine cancer (HCC)    Varicose veins of leg with pain, bilateral     Past Surgical History:  Procedure Laterality Date   ABDOMINAL HYSTERECTOMY     ABDOMINAL HYSTERECTOMY     APPENDECTOMY     CATARACT EXTRACTION W/ INTRAOCULAR LENS IMPLANT Bilateral    COLON SURGERY     CYSTOSCOPY  10/01/2020   Procedure: CYSTOSCOPY;  Surgeon: Raynelle Bring, MD;  [facility];  Service: Urology;;   CYSTOSCOPY W/ URETERAL STENT PLACEMENT Left 08/13/2020   Procedure: CYSTOSCOPY WITH TRANSURETHRAL RESECTION OF BLADDER/URETERAL TUMOR;  Surgeon: Raynelle Bring, MD;  [facility];  Service: Urology;  Laterality: Left;   CYSTOSCOPY WITH BIOPSY N/A 03/17/2022   Procedure: CYSTOSCOPY WITH BIOPSY AND TRANSURETHRAL RESECTION OF BLADDER TUMOR;  Surgeon: Raynelle Bring, MD;  [facility];  Service: Urology;  Laterality: N/A;   EYE SURGERY     cataract surgery bilat with lens implants   FULGURATION OF BLADDER TUMOR N/A 03/17/2022    Procedure: FULGURATION OF BLADDER TUMOR;  Surgeon: Raynelle Bring, MD;  [facility];  Service: Urology;  Laterality: N/A;   PARTIAL COLECTOMY     ROBOT ASSITED LAPAROSCOPIC NEPHROURETERECTOMY Left 10/01/2020   Procedure: XI ROBOT ASSITED LAPAROSCOPIC NEPHROURETERECTOMY;  Surgeon: Raynelle Bring, MD;  [facility];  Service: Urology;  Laterality: Left;   TONSILLECTOMY      Allergies as of 11/16/2022       Reactions   Tegretol [carbamazepine]    Almost comatose   Hydrocod Poli-chlorphe Poli Er Other (See Comments)   Felt bad        Medication List    diltiazem 120 MG 24 hr capsule Commonly known as: CARDIZEM CD TAKE 1 CAPSULE(120 MG) BY MOUTH DAILY   furosemide 20 MG tablet Commonly known as: LASIX Take 1 tablet (20 mg total) by mouth daily as needed (for shortness of breath or lower exremity edema).   levothyroxine 25 MCG tablet Commonly known as: SYNTHROID Take 25 mcg by mouth daily before breakfast.   losartan 25 MG tablet Commonly known as: COZAAR Take 25 mg by mouth daily.   Rivaroxaban 15 MG Tabs tablet Commonly known as: XARELTO Take 15 mg by mouth daily with supper.    Review of systems negative except as noted in HPI / PMHx or noted below:  Review of Systems  Constitutional: Negative.   HENT: Negative.    Eyes: Negative.   Respiratory: Negative.    Cardiovascular: Negative.   Gastrointestinal: Negative.   Genitourinary: Negative.   Musculoskeletal: Negative.   Skin: Negative.   Neurological: Negative.   Endo/Heme/Allergies: Negative.   Psychiatric/Behavioral: Negative.      Family History  Problem Relation Age of Onset   Heart attack Father 15       died   Heart attack Son 45       died   Diabetes Son    Heart Problems Son    Heart disease Other        famil,y hx of    Social History   Socioeconomic History   Marital status: Widowed    Spouse name: Not on file   Number of children: 5   Years of education: Not on file    Highest education level: Not on file  Occupational History   Not on file  Tobacco Use   Smoking status: Never   Smokeless tobacco: Never  Vaping Use   Vaping Use: Never used  Substance and Sexual Activity   Alcohol use: No   Drug use: No   Sexual activity: Not on file  Other Topics Concern   Not on file  Social History Narrative   Not on file   Environmental and Social history  Noncontributory  Objective:   Vitals:   11/16/22 0824  BP: (!) 144/72  Pulse: 96  Resp: 18  SpO2: 93%   Height: 5' (152.4 cm) Weight: 126 lb (57.2 kg)  Physical Exam Constitutional:  Appearance: She is not diaphoretic.  HENT:     Head: Normocephalic.     Right Ear: Tympanic membrane, ear canal and external ear normal.     Left Ear: Tympanic membrane, ear canal and external ear normal.     Nose: Nose normal. No mucosal edema or rhinorrhea.     Mouth/Throat:     Pharynx: Uvula midline. No oropharyngeal exudate.  Eyes:     Conjunctiva/sclera: Conjunctivae normal.  Neck:     Thyroid: No thyromegaly.     Trachea: Trachea normal. No tracheal tenderness or tracheal deviation.  Cardiovascular:     Rate and Rhythm: Normal rate and regular rhythm.     Heart sounds: Normal heart sounds, S1 normal and S2 normal. No murmur heard. Pulmonary:     Effort: No respiratory distress.     Breath sounds: Normal breath sounds. No stridor. No wheezing or rales.  Lymphadenopathy:     Head:     Right side of head: No tonsillar adenopathy.     Left side of head: No tonsillar adenopathy.     Cervical: No cervical adenopathy.  Skin:    Findings: No erythema or rash (erythematous nontender pitting edema feet, ankle, hands, wrist).     Nails: There is no clubbing.  Neurological:     Mental Status: She is alert.     Diagnostics: Allergy skin tests were not performed.   Assessment and Plan:    1. Hypersensitivity reaction, subsequent encounter   2. Malignant neoplasm of urinary bladder, unspecified  site ([place])    1. NO BCG exposure at this point in time  2. Blood - CBC w/d, CMP, C3, C4, SED, CRP, ANA w/R  3. Urine - U/A  4. Further evaluation and treatment???  Sara Griffin appears to have developed a serum sickness/arthus/reactive arthritis reaction most likely secondary to hypersensitivity directed against her BCG exposure.  Fortunately, she is improving regarding that reactivity.  We will obtain some screening blood test to look for more systemic involvement regarding this issue.  Assuming all of her blood tests are okay then we will assume that this reaction was secondary to BCG exposure and I would not recommend that she have BCG exposure in the future.  I will contact her with the results of her blood test once they are available for review.  We will send a letter to Dr. Alinda Money, urologist, who is directing her bladder cancer therapy.  Jiles Prows, MD Allergy / Immunology [facility]

## 2022-11-18 LAB — COMPREHENSIVE METABOLIC PANEL
ALT: 15 IU/L (ref 0–32)
AST: 23 IU/L (ref 0–40)
Albumin/Globulin Ratio: 2.3 — ABNORMAL HIGH (ref 1.2–2.2)
Albumin: 4.3 g/dL (ref 3.6–4.6)
Alkaline Phosphatase: 85 IU/L (ref 44–121)
BUN/Creatinine Ratio: 23 (ref 12–28)
BUN: 27 mg/dL (ref 10–36)
Bilirubin Total: 0.8 mg/dL (ref 0.0–1.2)
CO2: 22 mmol/L (ref 20–29)
Calcium: 9.5 mg/dL (ref 8.7–10.3)
Chloride: 103 mmol/L (ref 96–106)
Creatinine, Ser: 1.16 mg/dL — ABNORMAL HIGH (ref 0.57–1.00)
Globulin, Total: 1.9 g/dL (ref 1.5–4.5)
Glucose: 85 mg/dL (ref 70–99)
Potassium: 4.7 mmol/L (ref 3.5–5.2)
Sodium: 142 mmol/L (ref 134–144)
Total Protein: 6.2 g/dL (ref 6.0–8.5)
eGFR: 45 mL/min/{1.73_m2} — ABNORMAL LOW (ref >59)

## 2022-11-18 LAB — C3 AND C4
Complement C3, Serum: 126 mg/dL (ref 82–167)
Complement C4, Serum: 13 mg/dL (ref 12–38)

## 2022-11-18 LAB — CBC WITH DIFFERENTIAL/PLATELET
Basophils Absolute: 0 10*3/uL (ref 0.0–0.2)
Basos: 0 %
EOS (ABSOLUTE): 0.1 10*3/uL (ref 0.0–0.4)
Eos: 2 %
Hematocrit: 43.3 % (ref 34.0–46.6)
Hemoglobin: 13.7 g/dL (ref 11.1–15.9)
Immature Grans (Abs): 0 10*3/uL (ref 0.0–0.1)
Immature Granulocytes: 1 %
Lymphocytes Absolute: 1.2 10*3/uL (ref 0.7–3.1)
Lymphs: 20 %
MCH: 29.1 pg (ref 26.6–33.0)
MCHC: 31.6 g/dL (ref 31.5–35.7)
MCV: 92 fL (ref 79–97)
Monocytes Absolute: 0.6 10*3/uL (ref 0.1–0.9)
Monocytes: 11 %
Neutrophils Absolute: 3.9 10*3/uL (ref 1.4–7.0)
Neutrophils: 66 %
Platelets: 200 10*3/uL (ref 150–450)
RBC: 4.7 x10E6/uL (ref 3.77–5.28)
RDW: 12.4 % (ref 11.7–15.4)
WBC: 5.9 10*3/uL (ref 3.4–10.8)

## 2022-11-18 LAB — ANA W/REFLEX IF POSITIVE: Anti Nuclear Antibody (ANA): NEGATIVE

## 2022-11-18 LAB — SEDIMENTATION RATE: Sed Rate: 8 mm/hr (ref 0–40)

## 2022-11-18 LAB — URINALYSIS
Bilirubin, UA: NEGATIVE
Glucose, UA: NEGATIVE
Ketones, UA: NEGATIVE
Nitrite, UA: NEGATIVE
Specific Gravity, UA: 1.022 (ref 1.005–1.030)
Urobilinogen, Ur: 0.2 mg/dL (ref 0.2–1.0)
pH, UA: 5.5 (ref 5.0–7.5)

## 2022-11-18 LAB — C-REACTIVE PROTEIN: CRP: 6 mg/L (ref 0–10)

## 2022-11-21 ENCOUNTER — Ambulatory Visit (INDEPENDENT_AMBULATORY_CARE_PROVIDER_SITE_OTHER): Payer: Medicare Other | Admitting: Allergy and Immunology

## 2022-11-21 ENCOUNTER — Encounter: Payer: Self-pay | Admitting: Allergy and Immunology

## 2022-11-21 ENCOUNTER — Telehealth: Payer: Self-pay

## 2022-11-21 VITALS — BP 140/82 | HR 93 | Resp 16

## 2022-11-21 DIAGNOSIS — T7840XD Allergy, unspecified, subsequent encounter: Secondary | ICD-10-CM

## 2022-11-21 DIAGNOSIS — I420 Dilated cardiomyopathy: Secondary | ICD-10-CM

## 2022-11-21 DIAGNOSIS — L03116 Cellulitis of left lower limb: Secondary | ICD-10-CM

## 2022-11-21 DIAGNOSIS — C679 Malignant neoplasm of bladder, unspecified: Secondary | ICD-10-CM

## 2022-11-21 MED ORDER — CEPHALEXIN 500 MG PO CAPS: 500.0000 mg | ORAL_CAPSULE | Freq: Four times a day (QID) | ORAL | 0 refills | Status: AC

## 2022-11-21 NOTE — Progress Notes (Unsigned)
[facility] [place] [facility] [place]   Follow-up Note  Referring Provider: Algis Greenhouse, MD Primary Provider: Algis Greenhouse, MD Date of Office Visit: 11/21/2022  Subjective:   Sara Griffin (DOB: 02-28-1930) is a 89 y.o. 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  I last saw her in this clinic during her initial evaluation of 16 November 2022.  Her arms are doing much better.  Most of the redness involving her wrists have resolved.  Most of the swelling involving her wrist have resolved.  Her hands are not as puffy.  Her feet are marginally improved.  In fact, she thinks that there is more redness on the dorsal surface of her left foot and it still feels kind of bad.  There is still a lot of swelling in that area.  She always has "purple toes and feet" which has been a longstanding issue and that is about the same color but it is the new redness that is up further on her dorsal area that she is somewhat worried about.  Occasionally she does get swelling in her lower extremities and she has a history of intermittently using Lasix for this issue prescribed by her cardiologist.  She has not been on Lasix for many months.  Allergies as of 11/21/2022       Reactions   Tegretol [carbamazepine]    Almost comatose   Hydrocod Poli-chlorphe Poli Er Other (See Comments)   Felt bad        Medication List    diltiazem 120 MG 24 hr capsule Commonly known as: CARDIZEM CD TAKE 1 CAPSULE(120 MG) BY MOUTH DAILY   furosemide 20 MG tablet Commonly known as: LASIX Take 1 tablet (20 mg total) by mouth daily as needed (for shortness of breath or lower exremity edema).   levothyroxine 25 MCG tablet Commonly known  as: SYNTHROID Take 25 mcg by mouth daily before breakfast.   losartan 25 MG tablet Commonly known as: COZAAR Take 25 mg by mouth daily.   Rivaroxaban 15 MG Tabs tablet Commonly known as: XARELTO Take 15 mg by mouth daily with supper.      Past Medical History:  Diagnosis Date   Cardiomyopathy ([place])    Echo 1/19: mild LVH, EF 40-45, ant-sept DK, mild AI, MAC, mod MR, severe BAE, mod TR   Chronic atrial fibrillation (HCC)    Chronic systolic CHF (congestive heart failure) ([place])    a. dx 10/2017 but sx dating back to 08/2017.   CKD (chronic kidney disease), stage III (HCC)    a. ? CKD - baseline Cr 0.8-1.1.   Dysrhythmia    Heart murmur    Hx of colonic polyps    Hyperlipidemia    Hypertension    Hypothyroidism    Mild carotid artery disease ([place])    a. Duplex 2014 mild carotid plaque 0-39% BICA.   Moderate mitral regurgitation 10/2017   Moderate tricuspid regurgitation 10/2017   PVC's (premature ventricular contractions)    Uterine cancer (HCC)    Varicose veins of leg with pain, bilateral     Past Surgical History:  Procedure Laterality Date   ABDOMINAL HYSTERECTOMY     ABDOMINAL HYSTERECTOMY     APPENDECTOMY     CATARACT EXTRACTION W/  INTRAOCULAR LENS IMPLANT Bilateral    COLON SURGERY     CYSTOSCOPY  10/01/2020   Procedure: CYSTOSCOPY;  Surgeon: Raynelle Bring, MD;  Location: [facility];  Service: Urology;;   CYSTOSCOPY W/ URETERAL STENT PLACEMENT Left 08/13/2020   Procedure: CYSTOSCOPY WITH TRANSURETHRAL RESECTION OF BLADDER/URETERAL TUMOR;  Surgeon: Raynelle Bring, MD;  Location: [facility];  Service: Urology;  Laterality: Left;   CYSTOSCOPY WITH BIOPSY N/A 03/17/2022   Procedure: CYSTOSCOPY WITH BIOPSY AND TRANSURETHRAL RESECTION OF BLADDER TUMOR;  Surgeon: Raynelle Bring, MD;  Location: [facility];  Service: Urology;  Laterality: N/A;   EYE SURGERY     cataract surgery bilat with lens implants   FULGURATION OF BLADDER TUMOR N/A 03/17/2022   Procedure: FULGURATION OF  BLADDER TUMOR;  Surgeon: Raynelle Bring, MD;  Location: [facility];  Service: Urology;  Laterality: N/A;   PARTIAL COLECTOMY     ROBOT ASSITED LAPAROSCOPIC NEPHROURETERECTOMY Left 10/01/2020   Procedure: XI ROBOT ASSITED LAPAROSCOPIC NEPHROURETERECTOMY;  Surgeon: Raynelle Bring, MD;  Location: [facility];  Service: Urology;  Laterality: Left;   TONSILLECTOMY      Review of systems negative except as noted in HPI / PMHx or noted below:  Review of Systems  Constitutional: Negative.   HENT: Negative.    Eyes: Negative.   Respiratory: Negative.    Cardiovascular: Negative.   Gastrointestinal: Negative.   Genitourinary: Negative.   Musculoskeletal: Negative.   Skin: Negative.   Neurological: Negative.   Endo/Heme/Allergies: Negative.   Psychiatric/Behavioral: Negative.       Objective:   Vitals:   11/21/22 1511  BP: (!) 140/82  Pulse: 93  Resp: 16  SpO2: 95%          Physical Exam Constitutional:      Appearance: She is not diaphoretic.  HENT:     Head: Normocephalic.     Right Ear: Tympanic membrane, ear canal and external ear normal.     Left Ear: Tympanic membrane, ear canal and external ear normal.     Nose: Nose normal. No mucosal edema or rhinorrhea.     Mouth/Throat:     Pharynx: Uvula midline. No oropharyngeal exudate.  Eyes:     Conjunctiva/sclera: Conjunctivae normal.  Neck:     Thyroid: No thyromegaly.     Trachea: Trachea normal. No tracheal tenderness or tracheal deviation.  Cardiovascular:     Rate and Rhythm: Normal rate and regular rhythm.     Pulses:          Dorsalis pedis pulses are 1+ on the right side and 1+ on the left side.     Heart sounds: Normal heart sounds, S1 normal and S2 normal. No murmur heard. Pulmonary:     Effort: No respiratory distress.     Breath sounds: Normal breath sounds. No stridor. No wheezing or rales.  Musculoskeletal:     Right lower leg: 1+ Pitting Edema present.     Left lower leg: 1+ Pitting Edema present.   Lymphadenopathy:     Head:     Right side of head: No tonsillar adenopathy.     Left side of head: No tonsillar adenopathy.     Cervical: No cervical adenopathy.  Skin:    Findings: No erythema or rash (No erythema / edema of wrist.  Slight erythema of the fingers.  Pitting edema ankles feet.  Erythema dorsal left foot.  Purplish toes bilaterally).     Nails: There is no clubbing.  Neurological:     Mental Status: She  is alert.     Diagnostics: results of blood tests 16 November 2022 identifies creatinine 1.16 MG/DL, AST 23 U/L, ALT 15 U/L, WBC 5.9, absolute eosinophil 128, absolute lymphocyte 1200, hemoglobin 13.7, platelet 200, C3 126 mg/DL, C4 13 mg/DL.  Results of urine analysis obtained 16 November 2022 identifies Cloudy appearance, +2 leukocytes, +1 protein, +1 RBC   Assessment and Plan:   1. Hypersensitivity reaction, subsequent encounter   2. Malignant neoplasm of urinary bladder, unspecified site (HCC)   3. Cellulitis of left lower extremity   4. Dilated cardiomyopathy ([place])    1. NO BCG exposure at this point in time  2. Prednisone 10 mg - 1 tablet 1 time a day for 7 days only  3. Antibiotic: cephalexin 500 mg - 4 times a day for 7 days  4. Restart lasix / furosomide 20 mg - 1 tablet daily for 7 days  5. Return to clinic in 1 week.   I am going to have [place] use a low-dose of prednisone to address inflammation, cephalexin to address possible cellulitis, and furosemide to address fluid accumulation from her dilated cardiomyopathy.  I will regroup with her in 1 week.  Allena Katz, MD [facility]ergy / [facility] [place]

## 2022-11-21 NOTE — Telephone Encounter (Signed)
Talked with patient's son, Catie Chiao.  He said that his mom did not take her Tylenol last night and did not sleep well last night.  Her hands do not hurt today, but there is still some swelling (this is something that has only happened with this reaction).  Her feet do not hurt as bad.  There is still a little swelling and redness, and the redness on her right foot is spreading.  No chest congestion.  Wanting to know if she needs to take a Lasix?  Do you think this could be a heart issue or more associated with reaction?  Please advise.

## 2022-11-21 NOTE — Telephone Encounter (Signed)
Called and informed Sara Griffin of Dr. Bruna Potter message.  He is going to check with primary care physician and let us know if they cannot be seen there.

## 2022-11-21 NOTE — Patient Instructions (Addendum)
  1. NO BCG exposure at this point in time  2. Prednisone 10 mg - 1 tablet 1 time a day for 7 days only  3. Antibiotic: cephalexin 500 mg - 4 times a day for 7 days  4. Restart lasix / furosomide 20 mg - 1 tablet daily for 7 days  5. Return to clinic in 1 week.

## 2022-11-22 ENCOUNTER — Encounter: Payer: Self-pay | Admitting: Allergy and Immunology

## 2022-11-28 ENCOUNTER — Encounter: Payer: Self-pay | Admitting: Allergy and Immunology

## 2022-11-28 ENCOUNTER — Ambulatory Visit: Payer: Medicare Other | Admitting: Allergy and Immunology

## 2022-11-28 VITALS — BP 160/78 | HR 92 | Resp 18

## 2022-11-28 DIAGNOSIS — T7840XD Allergy, unspecified, subsequent encounter: Secondary | ICD-10-CM

## 2022-11-28 DIAGNOSIS — L03116 Cellulitis of left lower limb: Secondary | ICD-10-CM

## 2022-11-28 NOTE — Progress Notes (Signed)
[place] returns 28 November 2022 for her a look at her arms and legs following initiation of therapy directed against cellulitis and inflammation from adverse drug effect 21 November 2022.  She is much better and all of her pain involving her joints has resolved and her skin is returned back to normal and overall she feels great.  On exam today there was no evidence of any edema or tenderness or erythema.  She will follow-up with her urologist for upcoming meeting March 2024 and I will be happy to see her back in this clinic if needed.

## 2023-01-16 ENCOUNTER — Other Ambulatory Visit: Payer: Self-pay | Admitting: *Deleted

## 2023-01-16 MED ORDER — DILTIAZEM HCL ER COATED BEADS 120 MG PO CP24: ORAL_CAPSULE | ORAL | 3 refills | Status: DC

## 2023-02-02 DIAGNOSIS — I361 Nonrheumatic tricuspid (valve) insufficiency: Secondary | ICD-10-CM | POA: Diagnosis not present

## 2023-02-02 DIAGNOSIS — I351 Nonrheumatic aortic (valve) insufficiency: Secondary | ICD-10-CM | POA: Diagnosis not present

## 2023-02-02 DIAGNOSIS — I340 Nonrheumatic mitral (valve) insufficiency: Secondary | ICD-10-CM | POA: Diagnosis not present

## 2023-02-06 ENCOUNTER — Encounter: Payer: Self-pay | Admitting: Cardiology

## 2023-02-22 ENCOUNTER — Ambulatory Visit: Payer: Medicare Other | Admitting: Physician Assistant

## 2023-04-11 NOTE — Progress Notes (Unsigned)
Cardiology Office Note:    Date:  04/18/2023   ID:  Sara Griffin, DOB [birth year], MRN [id number]  PCP:  Olive Bass, MD   [facility] Providers Cardiologist:  Meriam Sprague, MD Cardiology APP:  Dyann Kief, PA-C  {   Referring MD: Olive Bass, MD    History of Present Illness:    Sara Griffin is a 89 y.o. female with a hx of chronic atrial fibrillation, HFrEF with LVEF 40-45%, HTN, HLD, carotid artery disease, moderate MR/TR, and CKD stave 3 who was previously followed by Dr. Delton See who now presents to clinic for follow-up.  Per review of the record, Echo 11/10/17 showed mild LVH, EF 40-45%, dyskinesis of anteroseptal myocardium, moderate mitral regurgitation, severely dilated LA/RA, moderate TR. Nuclear stress test 02/2016 negative for ischemia. Repeat TTE in 08/2020 with LVEF 45-50% with persistent anteroseptal dyskinesis, mild RV dysfunction, mild-to-moderate MR, moderate TR. Myoview 03/2020 with LVEF 61%, no ischemia or infarction.  Was seen as a preop visit by Eula Listen for cystoscopy with biopsy vs transurethral resection of bladder tumor. She was stable at that time and deemed appropriate to proceed without further CV testing.   Underwent transurethral removal of bladder tumor with Dr. Laverle Patter on 03/17/22 which she tolerated well.  Was last seen in clinic on 09/2022 by Jari Favre where her SOB was improved. No med changes at that time.  Hospitalized in 01/2023 for anemia. Managed conservatively at that time and xarelto was held for several days. Iron was started at that time. She has since restarted the xarelto.  Today, the patient states that she has not been feeling well. Feels very weak and tired throughout the day. No chest pain but has continued dyspnea on exertion. Has occasional LE edema but this is improved from prior visit. No orthopnea, PND, palpitations, lightheadedness or dizziness. States she had to hold her xarelto last week due to blood  in her stool. This improved and she has since resumed her xarelto but her stool is now black. Notably, she is taking iron supplementation. We discussed the risks/benefits of AC as patient has continued intermittent GIB since her hospitalization. She states she would like to go off [employer] at this time.    Past Medical History:  Diagnosis Date   Cardiomyopathy (HCC)    Echo 1/19: mild LVH, EF 40-45, ant-sept DK, mild AI, MAC, mod MR, severe BAE, mod TR   Chronic atrial fibrillation (HCC)    Chronic systolic CHF (congestive heart failure) (HCC)    a. dx 10/2017 but sx dating back to 08/2017.   CKD (chronic kidney disease), stage III (HCC)    a. ? CKD - baseline Cr 0.8-1.1.   Dysrhythmia    Heart murmur    Hx of colonic polyps    Hyperlipidemia    Hypertension    Hypothyroidism    Mild carotid artery disease (HCC)    a. Duplex 2014 mild carotid plaque 0-39% BICA.   Moderate mitral regurgitation 10/2017   Moderate tricuspid regurgitation 10/2017   PVC's (premature ventricular contractions)    Uterine cancer (HCC)    Varicose veins of leg with pain, bilateral     Past Surgical History:  Procedure Laterality Date   ABDOMINAL HYSTERECTOMY     ABDOMINAL HYSTERECTOMY     APPENDECTOMY     CATARACT EXTRACTION W/ INTRAOCULAR LENS IMPLANT Bilateral    COLON SURGERY     CYSTOSCOPY  10/01/2020   Procedure: CYSTOSCOPY;  Surgeon: Heloise Purpura,  MD;  Location: [facility];  Service: Urology;;   CYSTOSCOPY W/ URETERAL STENT PLACEMENT Left 08/13/2020   Procedure: CYSTOSCOPY WITH TRANSURETHRAL RESECTION OF BLADDER/URETERAL TUMOR;  Surgeon: Heloise Purpura, MD;  Location: [facility];  Service: Urology;  Laterality: Left;   CYSTOSCOPY WITH BIOPSY N/A 03/17/2022   Procedure: CYSTOSCOPY WITH BIOPSY AND TRANSURETHRAL RESECTION OF BLADDER TUMOR;  Surgeon: Heloise Purpura, MD;  Location: [facility];  Service: Urology;  Laterality: N/A;   EYE SURGERY     cataract surgery bilat with lens implants   FULGURATION OF BLADDER  TUMOR N/A 03/17/2022   Procedure: FULGURATION OF BLADDER TUMOR;  Surgeon: Heloise Purpura, MD;  Location: [facility];  Service: Urology;  Laterality: N/A;   PARTIAL COLECTOMY     ROBOT ASSITED LAPAROSCOPIC NEPHROURETERECTOMY Left 10/01/2020   Procedure: XI ROBOT ASSITED LAPAROSCOPIC NEPHROURETERECTOMY;  Surgeon: Heloise Purpura, MD;  Location: [facility];  Service: Urology;  Laterality: Left;   TONSILLECTOMY      Current Medications: Current Meds  Medication Sig   diltiazem (CARDIZEM CD) 120 MG 24 hr capsule Take 1 capsule (120 mg total) by mouth 2 (two) times daily.   ferrous sulfate 325 (65 FE) MG EC tablet Take 325 mg by mouth daily with breakfast.   furosemide (LASIX) 20 MG tablet Take 1 tablet (20 mg total) by mouth daily as needed (for shortness of breath or lower exremity edema).   [DISCONTINUED] diltiazem (CARDIZEM CD) 120 MG 24 hr capsule TAKE 1 CAPSULE(120 MG) BY MOUTH DAILY   [DISCONTINUED] levothyroxine (SYNTHROID) 25 MCG tablet Take 25 mcg by mouth daily before breakfast.    [DISCONTINUED] losartan (COZAAR) 25 MG tablet Take 25 mg by mouth daily.   [DISCONTINUED] Rivaroxaban (XARELTO) 15 MG TABS tablet Take 15 mg by mouth daily with supper.     Allergies:   Tegretol [carbamazepine] and Hydrocod poli-chlorphe poli er   Social History   Socioeconomic History   Marital status: Widowed    Spouse name: Not on file   Number of children: 5   Years of education: Not on file   Highest education level: Not on file  Occupational History   Not on file  Tobacco Use   Smoking status: Never   Smokeless tobacco: Never  Vaping Use   Vaping Use: Never used  Substance and Sexual Activity   Alcohol use: No   Drug use: No   Sexual activity: Not on file  Other Topics Concern   Not on file  Social History Narrative   Not on file   Social Determinants of Health   Financial Resource Strain: Not on file  Food Insecurity: Not on file  Transportation Needs: Not on file  Physical Activity:  Not on file  Stress: Not on file  Social Connections: Not on file     Family History: The patient's family history includes Diabetes in her son; Heart Problems in her son; Heart attack (age of onset: 2) in her son; Heart attack (age of onset: 66) in her father; Heart disease in an other family member.  ROS:   Please see the history of present illness.      EKGs/Labs/Other Studies Reviewed:    The following studies were reviewed today: Cardiac Studies & Procedures     STRESS TESTS  MYOCARDIAL PERFUSION IMAGING 04/21/2022  Narrative   The study is normal. The study is low risk.   No ST deviation was noted.   LV perfusion is normal. There is no evidence of ischemia. There is no evidence  of infarction.   Left ventricular function is normal. Nuclear stress EF: 63 %. The left ventricular ejection fraction is normal (55-65%). End diastolic cavity size is normal. End systolic cavity size is normal.   Prior study available for comparison from 04/01/2020.  Underlying rhythm afib Normal perfusion estimated EF 63%   ECHOCARDIOGRAM  ECHOCARDIOGRAM COMPLETE 10/04/2022  Narrative ECHOCARDIOGRAM REPORT    Patient Name:   Sara Griffin Lessley Date of Exam: 10/04/2022 Medical Rec #:  [id number]        Height:       62.0 in Accession #:    [id number]       Weight:       122.2 lb Date of Birth:  1930-01-16       BSA:          1.551 m Patient Age:    89 years         BP:           138/70 mmHg Patient Gender: F                HR:           94 bpm. Exam Location:  [facility]  Procedure: 2D Echo, Cardiac Doppler, Color Doppler and Strain Analysis  Indications:    Chest pressure [R07.89 (ICD-10-CM)]  History:        Patient has prior history of Echocardiogram examinations, most recent 04/15/2022. Cardiomyopathy, Carotid Disease, Arrythmias:PVC, Signs/Symptoms:Murmur; Risk Factors:Hypertension and Dyslipidemia.  Sonographer:    Louie Boston RDCS Referring Phys: 79 TESSA N  CONTE  IMPRESSIONS   1. GLS -9.0. Left ventricular ejection fraction, by estimation, is 50 to 55%. The left ventricle has low normal function. The left ventricle has no regional wall motion abnormalities. There is mild left ventricular hypertrophy. Left ventricular diastolic parameters are indeterminate. 2. Right ventricular systolic function is normal. The right ventricular size is normal. There is mildly elevated pulmonary artery systolic pressure. 3. Left atrial size was severely dilated. 4. Right atrial size was severely dilated. 5. The mitral valve is normal in structure. Mild to moderate mitral valve regurgitation. No evidence of mitral stenosis. 6. Tricuspid valve regurgitation is moderate to severe. 7. The aortic valve is calcified. There is moderate calcification of the aortic valve. Aortic valve regurgitation is mild. No aortic stenosis is present. 8. The inferior vena cava is normal in size with greater than 50% respiratory variability, suggesting right atrial pressure of 3 mmHg.  FINDINGS Left Ventricle: GLS -9.0. Left ventricular ejection fraction, by estimation, is 50 to 55%. The left ventricle has low normal function. The left ventricle has no regional wall motion abnormalities. The left ventricular internal cavity size was normal in size. There is mild left ventricular hypertrophy. Left ventricular diastolic parameters are indeterminate.  Right Ventricle: The right ventricular size is normal. No increase in right ventricular wall thickness. Right ventricular systolic function is normal. There is mildly elevated pulmonary artery systolic pressure. The tricuspid regurgitant velocity is 2.87 m/s, and with an assumed right atrial pressure of 8 mmHg, the estimated right ventricular systolic pressure is 40.9 mmHg.  Left Atrium: Left atrial size was severely dilated.  Right Atrium: Right atrial size was severely dilated.  Pericardium: There is no evidence of pericardial  effusion.  Mitral Valve: The mitral valve is normal in structure. Mild to moderate mitral valve regurgitation. No evidence of mitral valve stenosis.  Tricuspid Valve: The tricuspid valve is normal in structure. Tricuspid valve regurgitation is moderate to severe. No evidence  of tricuspid stenosis.  Aortic Valve: The aortic valve is calcified. There is moderate calcification of the aortic valve. Aortic valve regurgitation is mild. Aortic regurgitation PHT measures 663 msec. No aortic stenosis is present.  Pulmonic Valve: The pulmonic valve was normal in structure. Pulmonic valve regurgitation is not visualized. No evidence of pulmonic stenosis.  Aorta: The aortic root is normal in size and structure.  Venous: The inferior vena cava is normal in size with greater than 50% respiratory variability, suggesting right atrial pressure of 3 mmHg.  IAS/Shunts: No atrial level shunt detected by color flow Doppler.   LEFT VENTRICLE PLAX 2D LVIDd:         3.50 cm     Diastology LVIDs:         3.30 cm     LV e' medial:    2.63 cm/s LV PW:         1.30 cm     LV E/e' medial:  35.7 LV IVS:        1.30 cm     LV e' lateral:   3.08 cm/s LVOT diam:     1.90 cm     LV E/e' lateral: 30.5 LV SV:         42 LV SV Index:   27 LVOT Area:     2.84 cm  LV Volumes (MOD) LV vol d, MOD A2C: 34.9 ml LV vol d, MOD A4C: 28.3 ml LV vol s, MOD A2C: 17.2 ml LV vol s, MOD A4C: 17.2 ml LV SV MOD A2C:     17.7 ml LV SV MOD A4C:     28.3 ml LV SV MOD BP:      15.6 ml  RIGHT VENTRICLE            IVC RV S prime:     7.10 cm/s  IVC diam: 2.30 cm TAPSE (M-mode): 1.6 cm  LEFT ATRIUM             Index        RIGHT ATRIUM           Index LA diam:        4.30 cm 2.77 cm/m   RA Area:     24.50 cm LA Vol (A2C):   65.7 ml 42.37 ml/m  RA Volume:   76.80 ml  49.53 ml/m LA Vol (A4C):   66.1 ml 42.63 ml/m LA Biplane Vol: 69.3 ml 44.69 ml/m AORTIC VALVE LVOT Vmax:   82.20 cm/s LVOT Vmean:  57.800 cm/s LVOT VTI:     0.147 m AI PHT:      663 msec  AORTA Ao Root diam: 3.00 cm Ao Asc diam:  3.10 cm Ao Desc diam: 2.00 cm  MITRAL VALVE               TRICUSPID VALVE MV Area (PHT): 6.02 cm    TR Peak grad:   32.9 mmHg MV Decel Time: 126 msec    TR Vmax:        287.00 cm/s MV E velocity: 93.80 cm/s SHUNTS Systemic VTI:  0.15 m Systemic Diam: 1.90 cm  Gypsy Balsam MD Electronically signed by Gypsy Balsam MD Signature Date/Time: 10/04/2022/3:46:01 PM    Final              EKG:  No new tracing  Recent Labs: 11/16/2022: ALT 15; BUN 27; Creatinine, Ser 1.16; Hemoglobin 13.7; Platelets 200; Potassium 4.7; Sodium 142  Recent Lipid Panel    Component Value  Date/Time   CHOL 219 (H) 04/15/2019 0737   TRIG 103 04/15/2019 0737   HDL 83 04/15/2019 0737   CHOLHDL 2.6 04/15/2019 0737   LDLCALC 115 (H) 04/15/2019 0737     Risk Assessment/Calculations:    CHA2DS2-VASc Score = 6   This indicates a 9.7% annual risk of stroke. The patient's score is based upon: CHF History: 1 HTN History: 1 Diabetes History: 0 Stroke History: 0 Vascular Disease History: 1 Age Score: 2 Gender Score: 1         Physical Exam:    VS:  BP (!) 146/82   Pulse 83   Ht 5' (1.524 m)   Wt 127 lb (57.6 kg)   SpO2 95%   BMI 24.80 kg/m     Wt Readings from Last 3 Encounters:  04/18/23 127 lb (57.6 kg)  11/16/22 126 lb (57.2 kg)  10/14/22 124 lb 3.2 oz (56.3 kg)     GEN:  Elderly female, hard of hearing, comfortable HEENT: Normal NECK: No JVD; No carotid bruits CARDIAC: Irregularly irregular, 2/6 systolic murmur. No rubs.  RESPIRATORY:  Clear to auscultation without rales, wheezing or rhonchi  ABDOMEN: Soft, non-tender, non-distended MUSCULOSKELETAL:  Trace ankle edema. Warm SKIN: Warm and dry NEUROLOGIC:  Alert and oriented x 3 PSYCHIATRIC:  Normal affect   ASSESSMENT:    1. Chronic combined systolic and diastolic CHF (congestive heart failure) (HCC)   2. Essential hypertension   3.  Permanent atrial fibrillation (HCC)   4. Moderate mitral regurgitation   5. Mixed hyperlipidemia   6. DOE (dyspnea on exertion)   7. Stage 3a chronic kidney disease (HCC)   8. Gastrointestinal hemorrhage, unspecified gastrointestinal hemorrhage type   9. Medication management     PLAN:    In order of problems listed above:  #GIB: -Patient with recent hospitalization for rectal bleeding and anemia managed conservatively at that time -Now with continued episodes of blood in her stool -Discussed AC at length and agree it is very reasonable for her to stop xarelto at this time given elderly age and continued episodes of GIB -Will check CBC and iron studies today -Requesting referral to GI in [place]  #Exertional Fatigue: Myoview normal. TTE with LVEF 50-55%, normal RV, mild to moderate MR, mild AS, normal RAP. Currently stable without progression of symptoms.  #Chronic HF with Mildly Reduced EF: TTE in 08/2020 with LVEF 45-50% with anteroseptal dyskinesis. Repeat TTE in 09/2022 with EF 50-55%, normal RV, mild pulmonary HTN, severe biatrial enlargement, mild to moderate MR, mild AR. -Continue lasix 20mg   -Stopping losartan due hyperK -Will not add spiro/SGLT2i as patient would like to minimize medications  #Chronic atrial fibrillation: CHADS-vasc 5-6. Has been having melena with recent admission for GIB. Discussed risks/benefits of AC at length and patient would like to discontinue her xarelto for now given advanced age and ongoing bleeding issues.  -Increase dilt to 120mg  BID as she prefers BID dosing rather than increasing to 240mg  daily -Patient would like to stop xarelto due to continued episodes of GI bleeding as well as advanced age  #HTN: Elevated in the office. Will monitor at home on new dose of dilt. -Hold losartan for now due to high K -Increase dilt to 120mg  BID as she prefers BID dosing rather than increasing to 240mg  daily -Repeat BMET today  #Bladder Tumor: S/p  transurethral removal of bladder tumor on 03/17/22. -Follow-up with Dr. Laverle Patter as scheduled  #CKD: #History of reneal cell carcinoma s/p nephrectomy: -Follow-up with PCP as scheduled -  Check BMET today  #Mild-moderate MR: #Moderate to severe TR: -Stable on TTE 09/2022  #HLD: -Declined statin therapy; very reasonable at her age      Medication Adjustments/Labs and Tests Ordered: Current medicines are reviewed at length with the patient today.  Concerns regarding medicines are outlined above.  Orders Placed This Encounter  Procedures   CBC w/Diff   Basic metabolic panel   Iron, TIBC and Ferritin Panel   Ambulatory referral to Gastroenterology   Meds ordered this encounter  Medications   diltiazem (CARDIZEM CD) 120 MG 24 hr capsule    Sig: Take 1 capsule (120 mg total) by mouth 2 (two) times daily.    Dispense:  180 capsule    Refill:  3    Dose increase   levothyroxine (SYNTHROID) 25 MCG tablet    Sig: Take 1 tablet (25 mcg total) by mouth daily before breakfast.    Dispense:  90 tablet    Refill:  2    Patient Instructions  Medication Instructions:   STOP TAKING XARELTO NOW  STOP TAKING LOSARTAN NOW  INCREASE YOUR DILTIAZEM (CARDIZEM CD) TO 120 MG BY MOUTH TWICE DAILY  *If you need a refill on your cardiac medications before your next appointment, please call your pharmacy*   You have been referred to DR. Laurell Roof WITH GASTROENTEROLOGY IN [place]    Lab Work:  TODAY--BMET, CBC W DIFF, AND IRON, TIBC, AND FERRITIN PANEL  If you have labs (blood work) drawn today and your tests are completely normal, you will receive your results only by: MyChart Message (if you have MyChart) OR A paper copy in the mail If you have any lab test that is abnormal or we need to change your treatment, we will call you to review the results.   Follow-Up: At [facility], you and your health needs are our priority.  As part of our continuing mission to  provide you with exceptional heart care, we have created designated Provider Care Teams.  These Care Teams include your primary Cardiologist (physician) and Advanced Practice Providers (APPs -  Physician Assistants and Nurse Practitioners) who all work together to provide you with the care you need, when you need it.  We recommend signing up for the patient portal called "MyChart".  Sign up information is provided on this After Visit Summary.  MyChart is used to connect with patients for Virtual Visits (Telemedicine).  Patients are able to view lab/test results, encounter notes, upcoming appointments, etc.  Non-urgent messages can be sent to your provider as well.   To learn more about what you can do with MyChart, go to [email or web address].    Your next appointment:   6 month(s)  Provider:   DR. MARK SKAINS       Signed, Meriam Sprague, MD  04/18/2023 12:34 PM    [facility]

## 2023-04-18 ENCOUNTER — Encounter: Payer: Self-pay | Admitting: Cardiology

## 2023-04-18 ENCOUNTER — Ambulatory Visit: Payer: Medicare Other | Attending: Cardiology | Admitting: Cardiology

## 2023-04-18 VITALS — BP 146/82 | HR 83 | Ht 60.0 in | Wt 127.0 lb

## 2023-04-18 DIAGNOSIS — I5042 Chronic combined systolic (congestive) and diastolic (congestive) heart failure: Secondary | ICD-10-CM

## 2023-04-18 DIAGNOSIS — E782 Mixed hyperlipidemia: Secondary | ICD-10-CM

## 2023-04-18 DIAGNOSIS — N1831 Chronic kidney disease, stage 3a: Secondary | ICD-10-CM

## 2023-04-18 DIAGNOSIS — I340 Nonrheumatic mitral (valve) insufficiency: Secondary | ICD-10-CM

## 2023-04-18 DIAGNOSIS — I10 Essential (primary) hypertension: Secondary | ICD-10-CM | POA: Diagnosis not present

## 2023-04-18 DIAGNOSIS — Z79899 Other long term (current) drug therapy: Secondary | ICD-10-CM

## 2023-04-18 DIAGNOSIS — K922 Gastrointestinal hemorrhage, unspecified: Secondary | ICD-10-CM

## 2023-04-18 DIAGNOSIS — I4821 Permanent atrial fibrillation: Secondary | ICD-10-CM

## 2023-04-18 DIAGNOSIS — R0609 Other forms of dyspnea: Secondary | ICD-10-CM

## 2023-04-18 MED ORDER — LEVOTHYROXINE SODIUM 25 MCG PO TABS
25.0000 ug | ORAL_TABLET | Freq: Every day | ORAL | 2 refills | Status: DC
Start: 2023-04-18 — End: 2023-11-04

## 2023-04-18 MED ORDER — DILTIAZEM HCL ER COATED BEADS 120 MG PO CP24: 120.0000 mg | ORAL_CAPSULE | Freq: Two times a day (BID) | ORAL | 3 refills | Status: DC

## 2023-04-18 NOTE — Patient Instructions (Signed)
Medication Instructions:   STOP TAKING XARELTO NOW  STOP TAKING LOSARTAN NOW  INCREASE YOUR DILTIAZEM (CARDIZEM CD) TO 120 MG BY MOUTH TWICE DAILY  *If you need a refill on your cardiac medications before your next appointment, please call your pharmacy*   You have been referred to DR. Laurell Roof WITH GASTROENTEROLOGY IN [place]    Lab Work:  TODAY--BMET, CBC W DIFF, AND IRON, TIBC, AND FERRITIN PANEL  If you have labs (blood work) drawn today and your tests are completely normal, you will receive your results only by: MyChart Message (if you have MyChart) OR A paper copy in the mail If you have any lab test that is abnormal or we need to change your treatment, we will call you to review the results.   Follow-Up: At [facility], you and your health needs are our priority.  As part of our continuing mission to provide you with exceptional heart care, we have created designated Provider Care Teams.  These Care Teams include your primary Cardiologist (physician) and Advanced Practice Providers (APPs -  Physician Assistants and Nurse Practitioners) who all work together to provide you with the care you need, when you need it.  We recommend signing up for the patient portal called "MyChart".  Sign up information is provided on this After Visit Summary.  MyChart is used to connect with patients for Virtual Visits (Telemedicine).  Patients are able to view lab/test results, encounter notes, upcoming appointments, etc.  Non-urgent messages can be sent to your provider as well.   To learn more about what you can do with MyChart, go to [email or web address].    Your next appointment:   6 month(s)  Provider:   DR. MARK SKAINS

## 2023-04-19 LAB — CBC WITH DIFFERENTIAL/PLATELET
Basophils Absolute: 0 10*3/uL (ref 0.0–0.2)
Basos: 0 %
EOS (ABSOLUTE): 0.2 10*3/uL (ref 0.0–0.4)
Eos: 4 %
Hematocrit: 43.9 % (ref 34.0–46.6)
Hemoglobin: 14.5 g/dL (ref 11.1–15.9)
Immature Grans (Abs): 0 10*3/uL (ref 0.0–0.1)
Immature Granulocytes: 0 %
Lymphocytes Absolute: 1.7 10*3/uL (ref 0.7–3.1)
Lymphs: 28 %
MCH: 31.4 pg (ref 26.6–33.0)
MCHC: 33 g/dL (ref 31.5–35.7)
MCV: 95 fL (ref 79–97)
Monocytes Absolute: 0.6 10*3/uL (ref 0.1–0.9)
Monocytes: 9 %
Neutrophils Absolute: 3.6 10*3/uL (ref 1.4–7.0)
Neutrophils: 59 %
Platelets: 190 10*3/uL (ref 150–450)
RBC: 4.62 x10E6/uL (ref 3.77–5.28)
RDW: 15.4 % (ref 11.7–15.4)
WBC: 6.2 10*3/uL (ref 3.4–10.8)

## 2023-04-19 LAB — BASIC METABOLIC PANEL
BUN/Creatinine Ratio: 26 (ref 12–28)
BUN: 30 mg/dL (ref 10–36)
CO2: 21 mmol/L (ref 20–29)
Calcium: 9.6 mg/dL (ref 8.7–10.3)
Chloride: 105 mmol/L (ref 96–106)
Creatinine, Ser: 1.16 mg/dL — ABNORMAL HIGH (ref 0.57–1.00)
Glucose: 86 mg/dL (ref 70–99)
Potassium: 4.9 mmol/L (ref 3.5–5.2)
Sodium: 141 mmol/L (ref 134–144)
eGFR: 45 mL/min/{1.73_m2} — ABNORMAL LOW (ref >59)

## 2023-04-19 LAB — IRON,TIBC AND FERRITIN PANEL
Ferritin: 58 ng/mL (ref 15–150)
Iron Saturation: 36 % (ref 15–55)
Iron: 138 ug/dL (ref 27–139)
Total Iron Binding Capacity: 385 ug/dL (ref 250–450)
UIBC: 247 ug/dL (ref 118–369)

## 2023-05-17 ENCOUNTER — Telehealth: Payer: Self-pay | Admitting: *Deleted

## 2023-05-17 NOTE — Telephone Encounter (Signed)
REFERRAL Received: Maxwell Marion, Bunnie Pion, LPN Lajoyce [place]ers, All Information faxed to [phone].  Thanks  Referral Referral # [id number]        Referral Information  Referral # Creation Date Referral Status Status Update   [id number] 04/18/2023 Pending Review 04/18/2023: Status History     Status Reason Referral Type Referral Reasons Referral Class  New Request Consultation Specialty Services Required Internal     To Specialty To Provider To Location/Place of Service To Department  none Misenheimer, Marcial Pacas, MD

## 2023-06-15 DIAGNOSIS — I340 Nonrheumatic mitral (valve) insufficiency: Secondary | ICD-10-CM | POA: Diagnosis not present

## 2023-06-15 DIAGNOSIS — I351 Nonrheumatic aortic (valve) insufficiency: Secondary | ICD-10-CM | POA: Diagnosis not present

## 2023-07-04 NOTE — Progress Notes (Unsigned)
Cardiology Office Note    Patient Name: Sara Griffin Date of Encounter: 07/04/2023  Primary Care Provider:  Olive Bass, MD Primary Cardiologist:  Meriam Sprague, MD (Inactive) Primary Electrophysiologist: None   Past Medical History    Past Medical History:  Diagnosis Date   Cardiomyopathy [facility])    Echo 1/19: mild LVH, EF 40-45, ant-sept DK, mild AI, MAC, mod MR, severe BAE, mod TR   Chronic atrial fibrillation (HCC)    Chronic systolic CHF (congestive heart failure) (HCC)    a. dx 10/2017 but sx dating back to 08/2017.   CKD (chronic kidney disease), stage III (HCC)    a. ? CKD - baseline Cr 0.8-1.1.   Dysrhythmia    Heart murmur    Hx of colonic polyps    Hyperlipidemia    Hypertension    Hypothyroidism    Mild carotid artery disease (HCC)    a. Duplex 2014 mild carotid plaque 0-39% BICA.   Moderate mitral regurgitation 10/2017   Moderate tricuspid regurgitation 10/2017   PVC's (premature ventricular contractions)    Uterine cancer (HCC)    Varicose veins of leg with pain, bilateral     History of Present Illness  Sara Griffin is a 89 y.o. female with a PMH of chronic AF (on Xarelto), HTN, HLD, renal cell CA s/p nephrectomy, HFrEF, PVD s/p popliteal occlusion with mechanical thrombectomy mild carotid disease (1-39%) hypothyroidism moderate MR/TR, CKD stage III who presents today for posthospital follow-up.  Ms. Razzak was previously followed by Dr. Shari Prows and was seen most recently on 04/18/2023 for follow-up.  She had echo completed at [facility] that showed EF of 40% with apical akinesis and severely dilated LA/RA, mild AR, mild MR and severely elevated PASP of 70 mmHg. She was previously hospitalized on 01/2023 for anemia in the setting of a GI bleed and Xarelto was held several days.  During follow-up she reported not feeling well with occasional lower extremity edema.  Patient had requested to stop Xarelto due to continued GI bleed and advanced  age.  She was admitted on 05/14/2023 with complaint of right lower extremity pain.  She had a CTA completed that showed short segment popliteal occlusion with critical stenosis of the TP trunk. She underwent a R LE angioplasty with suction thrombectomy.  She was discharged on Xarelto was reinitiated.  She was admitted to [facility] for CHF exacerbation and 10 pound weight gain with shortness of breath.  She was treated with Lasix and has been using compression stockings.  During today's visit the patient reports that she has continued to feel poorly with no energy.  She lamination today with trace lower extremity edema present.  Blood pressure is 132/70 and heart rate is 65 bpm.  She is on iron supplement and does report dark sticky stools.  She is currently followed by gastroenterology.  She denies any occult bleeding with her Xarelto.  She is tolerating her current medications and denies any adverse reactions.  During today's visit we discussed the pathophysiology of CHF and reviewed the importance of abstaining from excess salt and maintaining good hydration.  [place] answered to her satisfaction today.  Patient denies chest pain, palpitations, dyspnea, PND, orthopnea, nausea, vomiting, dizziness, syncope, edema, weight gain, or early satiety.  Review of Systems  Please see the history of present illness.    All other systems reviewed and are otherwise negative except as noted above.  Physical Exam    Wt Readings from Last 3  Encounters:  04/18/23 127 lb (57.6 kg)  11/16/22 126 lb (57.2 kg)  10/14/22 124 lb 3.2 oz (56.3 kg)   TK:ZSWFU were no vitals filed for this visit.,There is no height or weight on file to calculate BMI. GEN: Well nourished, well developed in no acute distress Neck: No JVD; No carotid bruits Pulmonary: Clear to auscultation without rales, wheezing or rhonchi  Cardiovascular: Normal rate. Regular rhythm. Normal S1. Normal S2.   Murmurs: There is no murmur.  ABDOMEN:  Soft, non-tender, non-distended EXTREMITIES:  No edema; No deformity   EKG/LABS/ Recent Cardiac Studies   ECG personally reviewed by me today -atrial fibrillation with PVC rate of 65 bpm with no acute changes consistent with previous EKG.  Risk Assessment/Calculations:    CHA2DS2-VASc Score = 6   This indicates a 9.7% annual risk of stroke. The patient's score is based upon: CHF History: 1 HTN History: 1 Diabetes History: 0 Stroke History: 0 Vascular Disease History: 1 Age Score: 2 Gender Score: 1         Lab Results  Component Value Date   WBC 6.2 04/18/2023   HGB 14.5 04/18/2023   HCT 43.9 04/18/2023   MCV 95 04/18/2023   PLT 190 04/18/2023   Lab Results  Component Value Date   CREATININE 1.16 (H) 04/18/2023   BUN 30 04/18/2023   NA 141 04/18/2023   K 4.9 04/18/2023   CL 105 04/18/2023   CO2 21 04/18/2023   Lab Results  Component Value Date   CHOL 219 (H) 04/15/2019   HDL 83 04/15/2019   LDLCALC 115 (H) 04/15/2019   TRIG 103 04/15/2019   CHOLHDL 2.6 04/15/2019    Lab Results  Component Value Date   HGBA1C 5.3 10/21/2016   Assessment & Plan    1.  Chronic combined CHF: -Patient recently admitted with CHF exacerbation and [facility] with 10 pound weight loss and today reports that she continues to feel poorly and has some shortness of breath with activity. -She is euvolemic on examination today and does abstain from excess salt in her diet. -We will check BMET and BNP today -Continue Lasix 20 mg daily and we will plan to increase Lasix to 20 mg twice daily if renal function is stable -Patient will also take 20 mEq of potassium with Lasix during 3-day span. -Low sodium diet, fluid restriction <2L, and daily weights encouraged. Educated to contact our office for weight gain of 2 lbs overnight or 5 lbs in one week.   2.  Right acute ischemic popliteal occlusion: -Patient developed pain and found to have decreased pedal pulses with the arteriogram  completed showing popliteal occlusion. -s/p mechanical thrombectomy on 05/15/2023 -Today patient is doing well and has trace lower extremity edema and right lower extremity. -She is currently followed by vascular services  3.  Permanent AF: -Patient currently asymptomatic with rate of 65 bpm -Continue Cardizem -She is back on Xarelto reports of bleeding -She is currently on iron and reports some dark stools. -We will check CBC today -CHA2DS2-VASc Score = 6 [CHF History: 1, HTN History: 1, Diabetes History: 0, Stroke History: 0, Vascular Disease History: 1, Age Score: 2, Gender Score: 1].  Therefore, the patient's annual risk of stroke is 9.7 %.      4.  Essential hypertension: -Patient blood pressure today was controlled at 132/70 -Continue Cardizem 120 mg twice daily  5.  Mild to moderate MR: -Most recent 2D echo completed at [facility] with mild MVR and AVR  6.  HLD: -Patient declines statin therapy because of advanced age  Disposition: Follow-up months    Signed, Napoleon Form, Leodis Rains, NP 07/04/2023, 7:04 PM [facility]

## 2023-07-05 ENCOUNTER — Ambulatory Visit: Payer: Medicare Other | Attending: Nurse Practitioner | Admitting: Nurse Practitioner

## 2023-07-05 ENCOUNTER — Encounter: Payer: Self-pay | Admitting: Nurse Practitioner

## 2023-07-05 VITALS — BP 132/70 | HR 65 | Ht 62.0 in | Wt 129.4 lb

## 2023-07-05 DIAGNOSIS — E782 Mixed hyperlipidemia: Secondary | ICD-10-CM

## 2023-07-05 DIAGNOSIS — I10 Essential (primary) hypertension: Secondary | ICD-10-CM

## 2023-07-05 DIAGNOSIS — I4821 Permanent atrial fibrillation: Secondary | ICD-10-CM

## 2023-07-05 DIAGNOSIS — I340 Nonrheumatic mitral (valve) insufficiency: Secondary | ICD-10-CM

## 2023-07-05 DIAGNOSIS — I5042 Chronic combined systolic (congestive) and diastolic (congestive) heart failure: Secondary | ICD-10-CM | POA: Diagnosis not present

## 2023-07-05 MED ORDER — POTASSIUM CHLORIDE CRYS ER 20 MEQ PO TBCR
20.0000 meq | EXTENDED_RELEASE_TABLET | Freq: Every day | ORAL | 0 refills | Status: DC
Start: 2023-07-05 — End: 2023-07-17

## 2023-07-05 NOTE — Patient Instructions (Addendum)
Medication Instructions:  Your physician has recommended you make the following change in your medication:  DEPENDING ON KIDNEY FUNCTION: START FUROSEMIDE (LASIX) 20MG  TAKE 1 TABLET (20MG ) TWO TIMES DAILY FOR 3 DAYS. START POTASSIUM TAKE 1 TABLET BY MOUTH DAILY FOR 3 DAYS  *If you need a refill on your cardiac medications before your next appointment, please call your pharmacy*   Lab Work: BMET, BNP, AND CBC TODAY. 1 WEEK IN [place], BMET, BNP, AND CBC.  If you have labs (blood work) drawn today and your tests are completely normal, you will receive your results only by: MyChart Message (if you have MyChart) OR A paper copy in the mail If you have any lab test that is abnormal or we need to change your treatment, we will call you to review the results.   Testing/Procedures: NONE   Follow-Up: At [facility], you and your health needs are our priority.  As part of our continuing mission to provide you with exceptional heart care, we have created designated Provider Care Teams.  These Care Teams include your primary Cardiologist (physician) and Advanced Practice Providers (APPs -  Physician Assistants and Nurse Practitioners) who all work together to provide you with the care you need, when you need it.  We recommend signing up for the patient portal called "MyChart".  Sign up information is provided on this After Visit Summary.  MyChart is used to connect with patients for Virtual Visits (Telemedicine).  Patients are able to view lab/test results, encounter notes, upcoming appointments, etc.  Non-urgent messages can be sent to your provider as well.   To learn more about what you can do with MyChart, go to [email or web address].    Your next appointment:   3 month(s)  Provider:   Donato Schultz, MD    Other Instructions   yOUR PROVIDER IS REQUESTING YOU DRINK 64 OUNCES OF WATER DAILY. PLEASE USE ENSURE/BOOST.   Low-Sodium Eating Plan Sodium, which is an element  that makes up salt, helps you maintain a healthy balance of fluids in your body. Too much sodium can increase your blood pressure and cause fluid and waste to be held in your body. Your health care provider or dietitian may recommend following this plan if you have high blood pressure (hypertension), kidney disease, liver disease, or heart failure. Eating less sodium can help lower your blood pressure, reduce swelling, and protect your heart, liver, and kidneys. What are tips for following this plan? General guidelines Most people on this plan should limit their sodium intake to 1,500-2,000 mg (milligrams) of sodium each day. Reading food labels  The Nutrition Facts label lists the amount of sodium in one serving of the food. If you eat more than one serving, you must multiply the listed amount of sodium by the number of servings. Choose foods with less than 140 mg of sodium per serving. Avoid foods with 300 mg of sodium or more per serving. Shopping Look for lower-sodium products, often labeled as "low-sodium" or "no salt added." Always check the sodium content even if foods are labeled as "unsalted" or "no salt added". Buy fresh foods. Avoid canned foods and premade or frozen meals. Avoid canned, cured, or processed meats Buy breads that have less than 80 mg of sodium per slice. Cooking Eat more home-cooked food and less restaurant, buffet, and fast food. Avoid adding salt when cooking. Use salt-free seasonings or herbs instead of table salt or sea salt. Check with your health care provider or pharmacist before  using salt substitutes. Cook with plant-based oils, such as canola, sunflower, or olive oil. Meal planning When eating at a restaurant, ask that your food be prepared with less salt or no salt, if possible. Avoid foods that contain MSG (monosodium glutamate). MSG is sometimes added to [place] food, bouillon, and some canned foods. What foods are recommended? The items listed may not  be a complete list. Talk with your dietitian about what dietary choices are best for you. Grains Low-sodium cereals, including oats, puffed wheat and rice, and shredded wheat. Low-sodium crackers. Unsalted rice. Unsalted pasta. Low-sodium bread. Whole-grain breads and whole-grain pasta. Vegetables Fresh or frozen vegetables. "No salt added" canned vegetables. "No salt added" tomato sauce and paste. Low-sodium or reduced-sodium tomato and vegetable juice. Fruits Fresh, frozen, or canned fruit. Fruit juice. Meats and other protein foods Fresh or frozen (no salt added) meat, poultry, seafood, and fish. Low-sodium canned tuna and salmon. Unsalted nuts. Dried peas, beans, and lentils without added salt. Unsalted canned beans. Eggs. Unsalted nut butters. Dairy Milk. Soy milk. Cheese that is naturally low in sodium, such as ricotta cheese, fresh mozzarella, or Swiss cheese Low-sodium or reduced-sodium cheese. Cream cheese. Yogurt. Fats and oils Unsalted butter. Unsalted margarine with no trans fat. Vegetable oils such as canola or olive oils. Seasonings and other foods Fresh and dried herbs and spices. Salt-free seasonings. Low-sodium mustard and ketchup. Sodium-free salad dressing. Sodium-free light mayonnaise. Fresh or refrigerated horseradish. Lemon juice. Vinegar. Homemade, reduced-sodium, or low-sodium soups. Unsalted popcorn and pretzels. Low-salt or salt-free chips. What foods are not recommended? The items listed may not be a complete list. Talk with your dietitian about what dietary choices are best for you. Grains Instant hot cereals. Bread stuffing, pancake, and biscuit mixes. Croutons. Seasoned rice or pasta mixes. Noodle soup cups. Boxed or frozen macaroni and cheese. Regular salted crackers. Self-rising flour. Vegetables Sauerkraut, pickled vegetables, and relishes. Olives. [place] fries. Onion rings. Regular canned vegetables (not low-sodium or reduced-sodium). Regular canned tomato sauce  and paste (not low-sodium or reduced-sodium). Regular tomato and vegetable juice (not low-sodium or reduced-sodium). Frozen vegetables in sauces. Meats and other protein foods Meat or fish that is salted, canned, smoked, spiced, or pickled. Bacon, ham, sausage, hotdogs, corned beef, chipped beef, packaged lunch meats, salt pork, jerky, pickled herring, anchovies, regular canned tuna, sardines, salted nuts. Dairy Processed cheese and cheese spreads. Cheese curds. Blue cheese. Feta cheese. String cheese. Regular cottage cheese. Buttermilk. Canned milk. Fats and oils Salted butter. Regular margarine. Ghee. Bacon fat. Seasonings and other foods Onion salt, garlic salt, seasoned salt, table salt, and sea salt. Canned and packaged gravies. Worcestershire sauce. Tartar sauce. Barbecue sauce. Teriyaki sauce. Soy sauce, including reduced-sodium. Steak sauce. Fish sauce. Oyster sauce. Cocktail sauce. Horseradish that you find on the shelf. Regular ketchup and mustard. Meat flavorings and tenderizers. Bouillon cubes. Hot sauce and Tabasco sauce. Premade or packaged marinades. Premade or packaged taco seasonings. Relishes. Regular salad dressings. Salsa. Potato and tortilla chips. Corn chips and puffs. Salted popcorn and pretzels. Canned or dried soups. Pizza. Frozen entrees and pot pies. Summary Eating less sodium can help lower your blood pressure, reduce swelling, and protect your heart, liver, and kidneys. Most people on this plan should limit their sodium intake to 1,500-2,000 mg (milligrams) of sodium each day. Canned, boxed, and frozen foods are high in sodium. Restaurant foods, fast foods, and pizza are also very high in sodium. You also get sodium by adding salt to food. Try to cook at home, eat  more fresh fruits and vegetables, and eat less fast food, canned, processed, or prepared foods. This information is not intended to replace advice given to you by your health care provider. Make sure you discuss  any questions you have with your health care provider. Document Revised: 09/22/2017 Document Reviewed: 10/03/2016 Elsevier Patient Education  2020 Elsevier Inc.   Heart-Healthy Eating Plan Many factors influence your heart health, including eating and exercise habits. Heart health is also called coronary health. Coronary risk increases with abnormal blood fat (lipid) levels. A heart-healthy eating plan includes limiting unhealthy fats, increasing healthy fats, limiting salt (sodium) intake, and making other diet and lifestyle changes. What is my plan? Your health care provider may recommend that: You limit your fat intake to _________% or less of your total calories each day. You limit your saturated fat intake to _________% or less of your total calories each day. You limit the amount of cholesterol in your diet to less than _________ mg per day. You limit the amount of sodium in your diet to less than _________ mg per day. What are tips for following this plan? Cooking Cook foods using methods other than frying. Baking, boiling, grilling, and broiling are all good options. Other ways to reduce fat include: Removing the skin from poultry. Removing all visible fats from meats. Steaming vegetables in water or broth. Meal planning  At meals, imagine dividing your plate into fourths: Fill one-half of your plate with vegetables and green salads. Fill one-fourth of your plate with whole grains. Fill one-fourth of your plate with lean protein foods. Eat 2-4 cups of vegetables per day. One cup of vegetables equals 1 cup (91 g) broccoli or cauliflower florets, 2 medium carrots, 1 large bell pepper, 1 large sweet potato, 1 large tomato, 1 medium white potato, 2 cups (150 g) raw leafy greens. Eat 1-2 cups of fruit per day. One cup of fruit equals 1 small apple, 1 large banana, 1 cup (237 g) mixed fruit, 1 large orange,  cup (82 g) dried fruit, 1 cup (240 mL) 100% fruit juice. Eat more foods that  contain soluble fiber. Examples include apples, broccoli, carrots, beans, peas, and barley. Aim to get 25-30 g of fiber per day. Increase your consumption of legumes, nuts, and seeds to 4-5 servings per week. One serving of dried beans or legumes equals  cup (90 g) cooked, 1 serving of nuts is  oz (12 almonds, 24 pistachios, or 7 walnut halves), and 1 serving of seeds equals  oz (8 g). Fats Choose healthy fats more often. Choose monounsaturated and polyunsaturated fats, such as olive and canola oils, avocado oil, flaxseeds, walnuts, almonds, and seeds. Eat more omega-3 fats. Choose salmon, mackerel, sardines, tuna, flaxseed oil, and ground flaxseeds. Aim to eat fish at least 2 times each week. Check food labels carefully to identify foods with trans fats or high amounts of saturated fat. Limit saturated fats. These are found in animal products, such as meats, butter, and cream. Plant sources of saturated fats include palm oil, palm kernel oil, and coconut oil. Avoid foods with partially hydrogenated oils in them. These contain trans fats. Examples are stick margarine, some tub margarines, cookies, crackers, and other baked goods. Avoid fried foods. General information Eat more home-cooked food and less restaurant, buffet, and fast food. Limit or avoid alcohol. Limit foods that are high in added sugar and simple starches such as foods made using white refined flour (white breads, pastries, sweets). Lose weight if you are overweight.  Losing just 5-10% of your body weight can help your overall health and prevent diseases such as diabetes and heart disease. Monitor your sodium intake, especially if you have high blood pressure. Talk with your health care provider about your sodium intake. Try to incorporate more vegetarian meals weekly. What foods should I eat? Fruits All fresh, canned (in natural juice), or frozen fruits. Vegetables Fresh or frozen vegetables (raw, steamed, roasted, or grilled).  Green salads. Grains Most grains. Choose whole wheat and whole grains most of the time. Rice and pasta, including brown rice and pastas made with whole wheat. Meats and other proteins Lean, well-trimmed beef, veal, pork, and lamb. Chicken and [place] without skin. All fish and shellfish. Wild duck, rabbit, pheasant, and venison. Egg whites or low-cholesterol egg substitutes. Dried beans, peas, lentils, and tofu. Seeds and most nuts. Dairy Low-fat or nonfat cheeses, including ricotta and mozzarella. Skim or 1% milk (liquid, powdered, or evaporated). Buttermilk made with low-fat milk. Nonfat or low-fat yogurt. Fats and oils Non-hydrogenated (trans-free) margarines. Vegetable oils, including soybean, sesame, sunflower, olive, avocado, peanut, safflower, corn, canola, and cottonseed. Salad dressings or mayonnaise made with a vegetable oil. Beverages Water (mineral or sparkling). Coffee and tea. Unsweetened ice tea. Diet beverages. Sweets and desserts Sherbet, gelatin, and fruit ice. Small amounts of dark chocolate. Limit all sweets and desserts. Seasonings and condiments All seasonings and condiments. The items listed above may not be a complete list of foods and beverages you can eat. Contact a dietitian for more options. What foods should I avoid? Fruits Canned fruit in heavy syrup. Fruit in cream or butter sauce. Fried fruit. Limit coconut. Vegetables Vegetables cooked in cheese, cream, or butter sauce. Fried vegetables. Grains Breads made with saturated or trans fats, oils, or whole milk. Croissants. Sweet rolls. Donuts. High-fat crackers, such as cheese crackers and chips. Meats and other proteins Fatty meats, such as hot dogs, ribs, sausage, bacon, rib-eye roast or steak. High-fat deli meats, such as salami and bologna. Caviar. Domestic duck and goose. Organ meats, such as liver. Dairy Cream, sour cream, cream cheese, and creamed cottage cheese. Whole-milk cheeses. Whole or 2% milk  (liquid, evaporated, or condensed). Whole buttermilk. Cream sauce or high-fat cheese sauce. Whole-milk yogurt. Fats and oils Meat fat, or shortening. Cocoa butter, hydrogenated oils, palm oil, coconut oil, palm kernel oil. Solid fats and shortenings, including bacon fat, salt pork, lard, and butter. Nondairy cream substitutes. Salad dressings with cheese or sour cream. Beverages Regular sodas and any drinks with added sugar. Sweets and desserts Frosting. Pudding. Cookies. Cakes. Pies. Milk chocolate or white chocolate. Buttered syrups. Full-fat ice cream or ice cream drinks. The items listed above may not be a complete list of foods and beverages to avoid. Contact a dietitian for more information. Summary Heart-healthy meal planning includes limiting unhealthy fats, increasing healthy fats, limiting salt (sodium) intake and making other diet and lifestyle changes. Lose weight if you are overweight. Losing just 5-10% of your body weight can help your overall health and prevent diseases such as diabetes and heart disease. Focus on eating a balance of foods, including fruits and vegetables, low-fat or nonfat dairy, lean protein, nuts and legumes, whole grains, and heart-healthy oils and fats. This information is not intended to replace advice given to you by your health care provider. Make sure you discuss any questions you have with your health care provider. Document Revised: 11/15/2021 Document Reviewed: 11/15/2021 Elsevier Patient Education  2024 Elsevier Inc.  Daily Weight Record It is important  to weigh yourself daily. To do this: Make sure you use a reliable scale. Use the same scale each day. Keep this daily weight chart near your scale. Weigh yourself each morning at the same time after you use the bathroom. Before weighing yourself: Take off your shoes. Make sure you are wearing the same amount of clothing each day. Write down your weight in the spaces on the form. Compare today's  weight to yesterday's weight. Bring this form with you to your follow-up visits with your health care provider. Call your health care provider if you have concerns about your weight, including rapid weight gain or loss. Date: ________ Weight: ____________________ Date: ________ Weight: ____________________ Date: ________ Weight: ____________________ Date: ________ Weight: ____________________ Date: ________ Weight: ____________________ Date: ________ Weight: ____________________ Date: ________ Weight: ____________________ Date: ________ Weight: ____________________ Date: ________ Weight: ____________________ Date: ________ Weight: ____________________ Date: ________ Weight: ____________________ Date: ________ Weight: ____________________ Date: ________ Weight: ____________________ Date: ________ Weight: ____________________ Date: ________ Weight: ____________________ Date: ________ Weight: ____________________ Date: ________ Weight: ____________________ Date: ________ Weight: ____________________ Date: ________ Weight: ____________________ Date: ________ Weight: ____________________ Date: ________ Weight: ____________________ Date: ________ Weight: ____________________ Date: ________ Weight: ____________________ Date: ________ Weight: ____________________ Date: ________ Weight: ____________________ Date: ________ Weight: ____________________ Date: ________ Weight: ____________________ Date: ________ Weight: ____________________ Date: ________ Weight: ____________________ Date: ________ Weight: ____________________ Date: ________ Weight: ____________________ Date: ________ Weight: ____________________ Date: ________ Weight: ____________________ Date: ________ Weight: ____________________ Date: ________ Weight: ____________________ Date: ________ Weight: ____________________ Date: ________ Weight: ____________________ Date: ________ Weight: ____________________ Date: ________ Weight:  ____________________ Date: ________ Weight: ____________________ Date: ________ Weight: ____________________ Date: ________ Weight: ____________________ Date: ________ Weight: ____________________ Date: ________ Weight: ____________________ Date: ________ Weight: ____________________ Date: ________ Weight: ____________________ Date: ________ Weight: ____________________ Date: ________ Weight: ____________________ Date: ________ Weight: ____________________ Date: ________ Weight: ____________________ This information is not intended to replace advice given to you by your health care provider. Make sure you discuss any questions you have with your health care provider. Document Revised: 06/15/2021 Document Reviewed: 06/15/2021 Elsevier Patient Education  2024 [employer].

## 2023-07-05 NOTE — Addendum Note (Signed)
Addended by: Alveta Heimlich on: 07/05/2023 05:17 PM   Modules accepted: Orders

## 2023-07-06 ENCOUNTER — Other Ambulatory Visit: Payer: Self-pay

## 2023-07-06 DIAGNOSIS — I5042 Chronic combined systolic (congestive) and diastolic (congestive) heart failure: Secondary | ICD-10-CM

## 2023-07-06 LAB — BASIC METABOLIC PANEL
BUN/Creatinine Ratio: 17 (ref 12–28)
BUN: 21 mg/dL (ref 10–36)
CO2: 21 mmol/L (ref 20–29)
Calcium: 9.2 mg/dL (ref 8.7–10.3)
Chloride: 102 mmol/L (ref 96–106)
Creatinine, Ser: 1.22 mg/dL — ABNORMAL HIGH (ref 0.57–1.00)
Glucose: 91 mg/dL (ref 70–99)
Potassium: 4.3 mmol/L (ref 3.5–5.2)
Sodium: 138 mmol/L (ref 134–144)
eGFR: 42 mL/min/{1.73_m2} — ABNORMAL LOW (ref >59)

## 2023-07-06 LAB — CBC
Hematocrit: 35.3 % (ref 34.0–46.6)
Hemoglobin: 11.5 g/dL (ref 11.1–15.9)
MCH: 31.6 pg (ref 26.6–33.0)
MCHC: 32.6 g/dL (ref 31.5–35.7)
MCV: 97 fL (ref 79–97)
Platelets: 242 10*3/uL (ref 150–450)
RBC: 3.64 x10E6/uL — ABNORMAL LOW (ref 3.77–5.28)
RDW: 14.5 % (ref 11.7–15.4)
WBC: 6 10*3/uL (ref 3.4–10.8)

## 2023-07-06 LAB — PRO B NATRIURETIC PEPTIDE: NT-Pro BNP: 3288 pg/mL — ABNORMAL HIGH (ref 0–738)

## 2023-07-14 ENCOUNTER — Other Ambulatory Visit: Payer: Self-pay

## 2023-07-14 DIAGNOSIS — I10 Essential (primary) hypertension: Secondary | ICD-10-CM

## 2023-07-14 DIAGNOSIS — I5042 Chronic combined systolic (congestive) and diastolic (congestive) heart failure: Secondary | ICD-10-CM

## 2023-07-15 LAB — BASIC METABOLIC PANEL
BUN/Creatinine Ratio: 22 (ref 12–28)
BUN: 25 mg/dL (ref 10–36)
CO2: 21 mmol/L (ref 20–29)
Calcium: 9.2 mg/dL (ref 8.7–10.3)
Chloride: 103 mmol/L (ref 96–106)
Creatinine, Ser: 1.14 mg/dL — ABNORMAL HIGH (ref 0.57–1.00)
Glucose: 106 mg/dL — ABNORMAL HIGH (ref 70–99)
Potassium: 5.2 mmol/L (ref 3.5–5.2)
Sodium: 141 mmol/L (ref 134–144)
eGFR: 46 mL/min/{1.73_m2} — ABNORMAL LOW (ref >59)

## 2023-07-15 LAB — CBC
Hematocrit: 37 % (ref 34.0–46.6)
Hemoglobin: 11.3 g/dL (ref 11.1–15.9)
MCH: 30.9 pg (ref 26.6–33.0)
MCHC: 30.5 g/dL — ABNORMAL LOW (ref 31.5–35.7)
MCV: 101 fL — ABNORMAL HIGH (ref 79–97)
Platelets: 206 10*3/uL (ref 150–450)
RBC: 3.66 x10E6/uL — ABNORMAL LOW (ref 3.77–5.28)
RDW: 14.5 % (ref 11.7–15.4)
WBC: 5.8 10*3/uL (ref 3.4–10.8)

## 2023-07-15 LAB — PRO B NATRIURETIC PEPTIDE: NT-Pro BNP: 3185 pg/mL — ABNORMAL HIGH (ref 0–738)

## 2023-07-17 ENCOUNTER — Other Ambulatory Visit: Payer: Self-pay

## 2023-07-17 DIAGNOSIS — I5042 Chronic combined systolic (congestive) and diastolic (congestive) heart failure: Secondary | ICD-10-CM

## 2023-07-17 DIAGNOSIS — R0609 Other forms of dyspnea: Secondary | ICD-10-CM

## 2023-07-17 DIAGNOSIS — I4821 Permanent atrial fibrillation: Secondary | ICD-10-CM

## 2023-07-17 DIAGNOSIS — K922 Gastrointestinal hemorrhage, unspecified: Secondary | ICD-10-CM

## 2023-07-17 DIAGNOSIS — Z79899 Other long term (current) drug therapy: Secondary | ICD-10-CM

## 2023-07-17 DIAGNOSIS — N1831 Chronic kidney disease, stage 3a: Secondary | ICD-10-CM

## 2023-07-17 MED ORDER — FUROSEMIDE 20 MG PO TABS: 20.0000 mg | ORAL_TABLET | Freq: Two times a day (BID) | ORAL | 3 refills | Status: DC

## 2023-07-17 MED ORDER — POTASSIUM CHLORIDE CRYS ER 20 MEQ PO TBCR: 20.0000 meq | EXTENDED_RELEASE_TABLET | Freq: Every day | ORAL | 3 refills | Status: DC

## 2023-07-26 NOTE — Telephone Encounter (Signed)
Called patient's son and adv that she should be taken to the ER for evaluation of her symptoms.  He is going to take her now. Appreciative for input provided.     Gaston Islam., NP  Lendon Ka, RN Please advise patient to take his mother to the ED for further evaluation due to her weight gain and desaturation of O2.

## 2023-08-08 ENCOUNTER — Telehealth: Payer: Self-pay | Admitting: Nurse Practitioner

## 2023-08-08 NOTE — Telephone Encounter (Signed)
Called and spoke with patient who states she has been taking Furosemide 20mg  twice daily and noticed swelling started roughly two week ago. Says this morning, it was "as bad as it usually is at the end of the day." States fluid does go down some at night, but not going away. Does not wear compression socks because they are uncomfortable. States she can still get on shoes. RLE is worse than LLE. Keeps feet elevated as much as possible during the day. Does use two pillows at night, but states they slip out from under her head. Her 02 level is 94%, but does condone some SOB. States her pants are tighter than usual. Advised that she continue what she's doing and will speak with Alden Server tomorrow about this. If sudden change, go to ED. She didn't get repeat labs after increasing her Lasix to twice daily and only has 1 kidney, so diuresis adjustments need to be made cautiously.

## 2023-08-08 NOTE — Telephone Encounter (Signed)
Spoke with son per DPR and he is aware of provider recommendations and he verbalized understanding

## 2023-08-08 NOTE — Telephone Encounter (Signed)
Pt c/o swelling/edema: STAT if pt has developed SOB within 24 hours  If swelling, where is the swelling located?   Feet and ankles, pitting 2+, mid calf  How much weight have you gained and in what time span?  Yes  Have you gained 2 pounds in a day or 5 pounds in a week?  5 lbs in a week  Do you have a log of your daily weights (if so, list)?  Not available  Are you currently taking a fluid pill?   Yes  Are you currently SOB?   Yes  Have you traveled recently in a car or plane for an extended period of time?   No  Caller Lyla Son) stated patient has been having increased peripheral edema and pitting.

## 2023-08-08 NOTE — Telephone Encounter (Signed)
Pt's son returning phone call needing to speak with Maralyn Sago. Please advise

## 2023-08-16 ENCOUNTER — Encounter (HOSPITAL_COMMUNITY): Payer: Self-pay | Admitting: Internal Medicine

## 2023-08-16 ENCOUNTER — Ambulatory Visit (HOSPITAL_COMMUNITY)
Admission: RE | Admit: 2023-08-16 | Discharge: 2023-08-16 | Disposition: A | Discharge status: Home / Self Care | Payer: Medicare Other | Source: Ambulatory Visit | Attending: Internal Medicine | Admitting: Internal Medicine

## 2023-08-16 VITALS — BP 178/92 | HR 87 | Wt 136.6 lb

## 2023-08-16 DIAGNOSIS — I361 Nonrheumatic tricuspid (valve) insufficiency: Secondary | ICD-10-CM | POA: Insufficient documentation

## 2023-08-16 DIAGNOSIS — I5042 Chronic combined systolic (congestive) and diastolic (congestive) heart failure: Secondary | ICD-10-CM

## 2023-08-16 DIAGNOSIS — N183 Chronic kidney disease, stage 3 unspecified: Secondary | ICD-10-CM | POA: Insufficient documentation

## 2023-08-16 DIAGNOSIS — I10 Essential (primary) hypertension: Secondary | ICD-10-CM | POA: Diagnosis not present

## 2023-08-16 DIAGNOSIS — I340 Nonrheumatic mitral (valve) insufficiency: Secondary | ICD-10-CM | POA: Diagnosis not present

## 2023-08-16 DIAGNOSIS — E785 Hyperlipidemia, unspecified: Secondary | ICD-10-CM | POA: Diagnosis not present

## 2023-08-16 DIAGNOSIS — I272 Pulmonary hypertension, unspecified: Secondary | ICD-10-CM | POA: Diagnosis not present

## 2023-08-16 DIAGNOSIS — I130 Hypertensive heart and chronic kidney disease with heart failure and stage 1 through stage 4 chronic kidney disease, or unspecified chronic kidney disease: Secondary | ICD-10-CM | POA: Insufficient documentation

## 2023-08-16 DIAGNOSIS — R9431 Abnormal electrocardiogram [ECG] [EKG]: Secondary | ICD-10-CM | POA: Insufficient documentation

## 2023-08-16 DIAGNOSIS — I4821 Permanent atrial fibrillation: Secondary | ICD-10-CM | POA: Insufficient documentation

## 2023-08-16 DIAGNOSIS — Z85528 Personal history of other malignant neoplasm of kidney: Secondary | ICD-10-CM | POA: Insufficient documentation

## 2023-08-16 DIAGNOSIS — I5032 Chronic diastolic (congestive) heart failure: Secondary | ICD-10-CM | POA: Diagnosis not present

## 2023-08-16 DIAGNOSIS — I739 Peripheral vascular disease, unspecified: Secondary | ICD-10-CM | POA: Insufficient documentation

## 2023-08-16 DIAGNOSIS — Z7901 Long term (current) use of anticoagulants: Secondary | ICD-10-CM | POA: Diagnosis not present

## 2023-08-16 LAB — BASIC METABOLIC PANEL
Anion gap: 8 (ref 5–15)
BUN: 49 mg/dL — ABNORMAL HIGH (ref 8–23)
CO2: 23 mmol/L (ref 22–32)
Calcium: 9.6 mg/dL (ref 8.9–10.3)
Chloride: 112 mmol/L — ABNORMAL HIGH (ref 98–111)
Creatinine, Ser: 1.4 mg/dL — ABNORMAL HIGH (ref 0.44–1.00)
GFR, Estimated: 36 mL/min — ABNORMAL LOW (ref >60)
Glucose, Bld: 101 mg/dL — ABNORMAL HIGH (ref 70–99)
Potassium: 5.6 mmol/L — ABNORMAL HIGH (ref 3.5–5.1)
Sodium: 143 mmol/L (ref 135–145)

## 2023-08-16 LAB — BRAIN NATRIURETIC PEPTIDE: B Natriuretic Peptide: 1093.8 pg/mL — ABNORMAL HIGH (ref 0.0–100.0)

## 2023-08-16 MED ORDER — TORSEMIDE 20 MG PO TABS: 40.0000 mg | ORAL_TABLET | Freq: Two times a day (BID) | ORAL | 3 refills | Status: DC

## 2023-08-16 MED ORDER — POTASSIUM CHLORIDE CRYS ER 20 MEQ PO TBCR: 20.0000 meq | EXTENDED_RELEASE_TABLET | Freq: Two times a day (BID) | ORAL | 3 refills | Status: DC

## 2023-08-16 NOTE — Progress Notes (Signed)
ADVANCED HF CLINIC CONSULT NOTE  Referring Physician: Olive Bass, MD Primary Care: Olive Bass, MD Primary Cardiologist: None  HPI:  Sara Griffin is a 89 y.o. female with chronic AF (on Xarelto), HTN, HL, renal cell CA s/p nephrectomy, HFrEF, PVD s/p popliteal occlusion with mechanical thrombectomy mild carotid disease (1-39%) , moderate MR/TR, CKD stage III who is referred by Dr. Blenda Nicely for further evaluation of her PH.   Sara Griffin was previously followed by Dr. Shari Prows. She had echo completed at [facility] that showed EF of 40% with apical akinesis and severely dilated LA/RA, mild AR, mild MR and severely elevated PASP of 70 mmHg. She was previously hospitalized on 01/2023 for anemia in the setting of a GI bleed and Xarelto was held several days.  During follow-up she reported not feeling well with occasional lower extremity edema.  Patient had requested to stop Xarelto due to continued GI bleed and advanced age.  She was admitted on 05/14/2023 with complaint of right lower extremity pain.  She had a CTA completed that showed short segment popliteal occlusion with critical stenosis of the TP trunk. She underwent a R LE angioplasty with suction thrombectomy.  She was discharged on Xarelto was reinitiated.  She was admitted to [facility] in 9/24  for CHF exacerbation and 10 pound weight gain with shortness of breath.  She was treated with Lasix and has been using compression stockings.   Here with her son. Says she feels terrible. Extremely fatigued. SOB with any exertion. Denies PND, orthopnea or CP. Wears oxygen at night. Marked edema in LE. + ab bloating. Taking lasix 20 bid     Past Medical History:  Diagnosis Date   Cardiomyopathy (HCC)    Echo 1/19: mild LVH, EF 40-45, ant-sept DK, mild AI, MAC, mod MR, severe BAE, mod TR   Chronic atrial fibrillation (HCC)    Chronic systolic CHF (congestive heart failure) (HCC)    a. dx 10/2017 but sx dating back to  08/2017.   CKD (chronic kidney disease), stage III (HCC)    a. ? CKD - baseline Cr 0.8-1.1.   Dysrhythmia    Heart murmur    Hx of colonic polyps    Hyperlipidemia    Hypertension    Hypothyroidism    Mild carotid artery disease (HCC)    a. Duplex 2014 mild carotid plaque 0-39% BICA.   Moderate mitral regurgitation 10/2017   Moderate tricuspid regurgitation 10/2017   PVC's (premature ventricular contractions)    Uterine cancer (HCC)    Varicose veins of leg with pain, bilateral     Current Outpatient Medications  Medication Sig Dispense Refill   aspirin EC 81 MG tablet Take 1 tablet by mouth daily.     Cholecalciferol 125 MCG (5000 UT) capsule Take 5,000 Units by mouth daily.     Cyanocobalamin (VITAMIN B-12 PO) Take 1 tablet by mouth daily at 6 (six) AM.     diltiazem (CARDIZEM CD) 120 MG 24 hr capsule Take 1 capsule (120 mg total) by mouth 2 (two) times daily. 180 capsule 3   ferrous sulfate 325 (65 FE) MG EC tablet Take 325 mg by mouth 2 (two) times daily.     furosemide (LASIX) 20 MG tablet Take 1 tablet (20 mg total) by mouth 2 (two) times daily. (Patient taking differently: Take 20 mg by mouth 2 (two) times daily. As needed) 180 tablet 3   levothyroxine (SYNTHROID) 25 MCG tablet Take 1 tablet (25 mcg  total) by mouth daily before breakfast. 90 tablet 2   potassium chloride SA (KLOR-CON M) 20 MEQ tablet Take 1 tablet (20 mEq total) by mouth daily. 90 tablet 3   Rivaroxaban (XARELTO) 15 MG TABS tablet Take 15 mg by mouth daily with supper.     No current facility-administered medications for this encounter.    Allergies  Allergen Reactions   Tegretol [Carbamazepine]     Almost comatose   Hydrocod Poli-Chlorphe Poli Er Other (See Comments)    Felt bad      Social History   Socioeconomic History   Marital status: Widowed    Spouse name: Not on file   Number of children: 5   Years of education: Not on file   Highest education level: Not on file  Occupational History    Not on file  Tobacco Use   Smoking status: Never   Smokeless tobacco: Never  Vaping Use   Vaping status: Never Used  Substance and Sexual Activity   Alcohol use: No   Drug use: No   Sexual activity: Not on file  Other Topics Concern   Not on file  Social History Narrative   Not on file   Social Determinants of Health   Financial Resource Strain: Not on file  Food Insecurity: Low Risk  (06/21/2023)   Received from [facility]   Hunger Vital Sign    Worried About Running Out of Food in the Last Year: Never true    Ran Out of Food in the Last Year: Never true  Transportation Needs: No Transportation Needs (06/21/2023)   Received from [facility]    In the past 12 months, has lack of reliable transportation kept you from medical appointments, meetings, work or from getting things needed for daily living? : No  Physical Activity: Not on file  Stress: Not on file  Social Connections: Not on file  Intimate Partner Violence: Not on file      Family History  Problem Relation Age of Onset   Heart attack Father 42       died   Heart attack Son 34       died   Diabetes Son    Heart Problems Son    Heart disease Other        famil,y hx of    Vitals:   08/16/23 1504  BP: (!) 178/92  Pulse: 87  SpO2: 92%  Weight: 62 kg (136 lb 9.6 oz)    PHYSICAL EXAM: General:  Elderly. SOB with any exertion HEENT: normal Neck: supple. JVP to ear. Carotids 2+ bilat; no bruits. No lymphadenopathy or thryomegaly appreciated. Cor: Irregular rate & rhythm. 2/6 TR Lungs: clear Abdomen: soft, nontender, nondistended. No hepatosplenomegaly. No bruits or masses. Good bowel sounds. Extremities: no cyanosis, clubbing, rash, 3+ edema Neuro: alert & oriented x 3, cranial nerves grossly intact. moves all 4 extremities w/o difficulty. Affect pleasant.  ECG: AF 70 IVCD Personally reviewed   ASSESSMENT & PLAN:  1.  Pulmonary HTN - Echo 2024 at [facility] 40%-45% with  apical akinesis and severely dilated LA/RA, mild AR, mild MR and severely elevated PASP of 70 mmHg.  - In reviewing echo she has clear evidence of restrictive cardiomyopathy with elevated filling pressures - suspect primarily WHO Group II PAH - NYHA IIIb with marked volume overload on exam - Can consider RHC down the road but wil first need to optimize volume status - Switch lasix to torsemide 40 bid.  -  Will use Furoscix x 3 days - Increase kcl to 20 bid - Check labs. - RTC next week for re-eval  2. Chronic diastolic HF - restrictive physiology on echo - diurese as above - Can consider SGLT2i in future though need to be careful in setting of advanced age   49.  Permanent AF: - Rate controlled on cardizem. - Now tolerating Xarelto - CHA2DS2-VASc Score = 6 [CHF History: 1, HTN History: 1, Diabetes History: 0, Stroke History: 0, Vascular Disease History: 1, Age Score: 2, Gender Score: 1].  Therefore, the patient's annual risk of stroke is 9.7 %.    - Labs today  4.  PAD s/p Right acute ischemic popliteal occlusion: -s/p mechanical thrombectomy on 05/15/2023 -Stable followed by [facility]   5.  Essential hypertension: - Blood pressure well controlled. Continue current regimen.   I spent a total of 50 minutes today: 1) reviewing the patient's medical records including previous charts, labs and recent notes from other providers; 2) examining the patient and counseling them on their medical issues/explaining the plan of care; 3) adjusting meds as needed and 4) ordering lab work or other needed tests.    Arvilla Meres, MD  3:41 PM

## 2023-08-16 NOTE — Patient Instructions (Addendum)
Medication Changes:  STOP Furosemide  START Torsemide 40 mg (2 tabs) Twice daily starting on Sunday 08/20/23  Increase Potassium to 20 meq Twice daily   Your provider has order Furoscix for you. This is an on-body infuser that gives you a dose of Furosemide.   It will be shipped to your home, we have provided you with 3 sample kits   Furoscix [employer] will call you to discuss before shipping so, PLEASE answer unknown calls  For questions regarding the device call Furoscix [employer] at [phone]  Ensure you write down the time you start your infusion so that if there is a problem you will know how long the infusion lasted  Use Furoscix only AS DIRECTED by our office  Dosing Directions:   Day 1= tomorrow, Thur 10/24 use 1 Furoscix Kit  Day 2= Friday 10/25, use 1 Furoscix Kit  Day 3= Saturday 10/26, use 1 Furoscix Kit  Day 4= Sunday 10/27, start Torsemide 40 mg Twice daily    Lab Work:  Labs done today, your results will be available in MyChart, we will contact you for abnormal readings.  Testing/Procedures:  none  Referrals:  none  Special Instructions // Education:  Do the following things EVERYDAY: Weigh yourself in the morning before breakfast. Write it down and keep it in a log. Take your medicines as prescribed Eat low salt foods--Limit salt (sodium) to 2000 mg per day.  Stay as active as you can everyday Limit all fluids for the day to less than 2 liters  Please wear your compression hose daily, place them on as soon as you get up in the morning and remove before you go to bed at night.   Follow-Up in: 1 week   At the [facility], you and your health needs are our priority. We have a designated team specialized in the treatment of Heart Failure. This Care Team includes your primary Heart Failure Specialized Cardiologist (physician), Advanced Practice Providers (APPs- Physician Assistants and Nurse Practitioners), and Pharmacist who all  work together to provide you with the care you need, when you need it.   You may see any of the following providers on your designated Care Team at your next follow up:  Dr. Arvilla Meres Dr. Marca Ancona Dr. Dorthula Nettles Dr. Theresia Bough Tonye Becket, NP Robbie Lis, [place] [facility] [place], [place] Brynda Peon, NP [place] Lee, NP Karle Plumber, PharmD   Please be sure to bring in all your medications bottles to every appointment.   Need to Contact us:  If you have any questions or concerns before your next appointment please send [place] a message through [place] or call our office at [phone].    TO LEAVE A MESSAGE FOR THE NURSE SELECT OPTION 2, PLEASE LEAVE A MESSAGE INCLUDING: YOUR NAME DATE OF BIRTH CALL BACK NUMBER REASON FOR CALL**this is important as we prioritize the call backs  YOU WILL RECEIVE A CALL BACK THE SAME DAY AS LONG AS YOU CALL BEFORE 4:00 PM

## 2023-08-16 NOTE — Progress Notes (Signed)
Provided Furoscix 80 mg samples:  LN [id number]; exp 09/22/24; 3 boxes

## 2023-08-18 ENCOUNTER — Telehealth (HOSPITAL_COMMUNITY): Payer: Self-pay | Admitting: Cardiology

## 2023-08-18 NOTE — Telephone Encounter (Signed)
  Above reviewed with Dr Gala Romney -if pt has not received full doses ?UOP if she is not feeling better should report to er, will need bmet  -if she is feeling better HH to draw bmet-no change to plan   [facility] aware -reports down 1 lb but not feeling much better -agree with ER   Family aware and voiced understanding Will report to [place]

## 2023-08-18 NOTE — Telephone Encounter (Signed)
Pt given  furoscixs kits x 3 08/16/23  Kit # 1 - stopped after 1.5 hr-called customer service  Kit #2 only ran 1.5 hr as well-HHRN confirmed   Due for kit #3 today  Since pt has not received a full dose in previous infusions HHRN wanted to confirm if there is a change to plan   [phone] [facility]

## 2023-08-21 NOTE — Telephone Encounter (Signed)
Pt was admitted to [facility]

## 2023-08-22 ENCOUNTER — Encounter (HOSPITAL_COMMUNITY): Payer: Medicare Other

## 2023-09-11 ENCOUNTER — Encounter (HOSPITAL_COMMUNITY): Payer: Self-pay

## 2023-09-11 ENCOUNTER — Ambulatory Visit (HOSPITAL_COMMUNITY)
Admission: RE | Admit: 2023-09-11 | Discharge: 2023-09-11 | Disposition: A | Discharge status: Home / Self Care | Payer: Medicare Other | Source: Ambulatory Visit | Attending: Family Medicine | Admitting: Family Medicine

## 2023-09-11 VITALS — BP 140/72 | HR 88 | Wt 126.4 lb

## 2023-09-11 DIAGNOSIS — I272 Pulmonary hypertension, unspecified: Secondary | ICD-10-CM | POA: Diagnosis not present

## 2023-09-11 DIAGNOSIS — I4821 Permanent atrial fibrillation: Secondary | ICD-10-CM | POA: Insufficient documentation

## 2023-09-11 DIAGNOSIS — I10 Essential (primary) hypertension: Secondary | ICD-10-CM

## 2023-09-11 DIAGNOSIS — Z7901 Long term (current) use of anticoagulants: Secondary | ICD-10-CM | POA: Insufficient documentation

## 2023-09-11 DIAGNOSIS — C662 Malignant neoplasm of left ureter: Secondary | ICD-10-CM

## 2023-09-11 DIAGNOSIS — R0602 Shortness of breath: Secondary | ICD-10-CM | POA: Insufficient documentation

## 2023-09-11 DIAGNOSIS — I425 Other restrictive cardiomyopathy: Secondary | ICD-10-CM | POA: Diagnosis not present

## 2023-09-11 DIAGNOSIS — R1900 Intra-abdominal and pelvic swelling, mass and lump, unspecified site: Secondary | ICD-10-CM | POA: Insufficient documentation

## 2023-09-11 DIAGNOSIS — Z9981 Dependence on supplemental oxygen: Secondary | ICD-10-CM | POA: Insufficient documentation

## 2023-09-11 DIAGNOSIS — I739 Peripheral vascular disease, unspecified: Secondary | ICD-10-CM | POA: Diagnosis not present

## 2023-09-11 DIAGNOSIS — Z905 Acquired absence of kidney: Secondary | ICD-10-CM | POA: Diagnosis not present

## 2023-09-11 DIAGNOSIS — N183 Chronic kidney disease, stage 3 unspecified: Secondary | ICD-10-CM | POA: Insufficient documentation

## 2023-09-11 DIAGNOSIS — Z79899 Other long term (current) drug therapy: Secondary | ICD-10-CM | POA: Insufficient documentation

## 2023-09-11 DIAGNOSIS — I5032 Chronic diastolic (congestive) heart failure: Secondary | ICD-10-CM | POA: Diagnosis not present

## 2023-09-11 DIAGNOSIS — I130 Hypertensive heart and chronic kidney disease with heart failure and stage 1 through stage 4 chronic kidney disease, or unspecified chronic kidney disease: Secondary | ICD-10-CM | POA: Diagnosis not present

## 2023-09-11 LAB — BASIC METABOLIC PANEL
Anion gap: 10 (ref 5–15)
BUN: 59 mg/dL — ABNORMAL HIGH (ref 8–23)
CO2: 24 mmol/L (ref 22–32)
Calcium: 8.8 mg/dL — ABNORMAL LOW (ref 8.9–10.3)
Chloride: 102 mmol/L (ref 98–111)
Creatinine, Ser: 2.23 mg/dL — ABNORMAL HIGH (ref 0.44–1.00)
GFR, Estimated: 20 mL/min — ABNORMAL LOW (ref >60)
Glucose, Bld: 94 mg/dL (ref 70–99)
Potassium: 3.6 mmol/L (ref 3.5–5.1)
Sodium: 136 mmol/L (ref 135–145)

## 2023-09-11 LAB — CBC
HCT: 29.3 % — ABNORMAL LOW (ref 36.0–46.0)
Hemoglobin: 9.4 g/dL — ABNORMAL LOW (ref 12.0–15.0)
MCH: 31.9 pg (ref 26.0–34.0)
MCHC: 32.1 g/dL (ref 30.0–36.0)
MCV: 99.3 fL (ref 80.0–100.0)
Platelets: 186 10*3/uL (ref 150–400)
RBC: 2.95 MIL/uL — ABNORMAL LOW (ref 3.87–5.11)
RDW: 16.2 % — ABNORMAL HIGH (ref 11.5–15.5)
WBC: 6.3 10*3/uL (ref 4.0–10.5)
nRBC: 0 % (ref 0.0–0.2)

## 2023-09-11 LAB — MAGNESIUM: Magnesium: 2.3 mg/dL (ref 1.7–2.4)

## 2023-09-11 LAB — BRAIN NATRIURETIC PEPTIDE: B Natriuretic Peptide: 1115.3 pg/mL — ABNORMAL HIGH (ref 0.0–100.0)

## 2023-09-11 MED ORDER — RIVAROXABAN 15 MG PO TABS: 15.0000 mg | ORAL_TABLET | Freq: Every day | ORAL | 6 refills | Status: DC

## 2023-09-11 NOTE — Patient Instructions (Signed)
Thank you for coming in today  If you had labs drawn today, any labs that are abnormal the clinic will call you No news is good news   [facility] [address] [place] [address] Dept: [phone] Loc: [phone]  Sara Griffin  09/11/2023  You are scheduled for a Cardiac Catheterization on Monday, December 9 with Dr. Arvilla Meres.  1. Please arrive at the [facility] (Main Entrance A) at [facility]: [address] [place], [place] [address] at 7:00 AM (This time is 2 hour(s) before your procedure to ensure your preparation).   Free valet parking service is available. You will check in at ADMITTING. The support person will be asked to wait in the waiting room.  It is OK to have someone drop you off and come back when you are ready to be discharged.    Special note: Every effort is made to have your procedure done on time. Please understand that emergencies sometimes delay scheduled procedures.  2. Diet: Do not eat solid foods after midnight.  The patient may have clear liquids until 5am upon the day of the procedure.    4. Medication instructions in preparation for your procedure:   Contrast Allergy: No   Current Outpatient Medications (Endocrine & Metabolic):    levothyroxine (SYNTHROID) 25 MCG tablet, Take 1 tablet (25 mcg total) by mouth daily before breakfast.  Current Outpatient Medications (Cardiovascular):    diltiazem (CARDIZEM CD) 120 MG 24 hr capsule, Take 1 capsule (120 mg total) by mouth 2 (two) times daily.   torsemide (DEMADEX) 20 MG tablet, Take 2 tablets (40 mg total) by mouth 2 (two) times daily.   Current Outpatient Medications (Analgesics):    ASPIRIN 81 PO, Take 1 tablet by mouth daily.  Current Outpatient Medications (Hematological):    ferrous sulfate 325 (65 FE) MG EC tablet, Take 325 mg by mouth 2 (two) times daily.   Rivaroxaban (XARELTO) 15 MG  TABS tablet, Take 15 mg by mouth daily with supper.  Current Outpatient Medications (Other):    Cholecalciferol 125 MCG (5000 UT) capsule, Take 5,000 Units by mouth daily.   polyethylene glycol (MIRALAX / GLYCOLAX) 17 g packet, Take 17 g by mouth as needed.   potassium chloride SA (KLOR-CON M) 20 MEQ tablet, Take 1 tablet (20 mEq total) by mouth 2 (two) times daily.   UNABLE TO FIND, Med Name: Benefiber 2 teaspoons by mouth daily *For reference purposes while preparing patient instructions.   Delete this med list prior to printing instructions for patient.*  Hold Xarelto the day before your procedure    On the morning of your procedure, take your Aspirin 81 mg and any morning medicines NOT listed above.  You may use sips of water.  5. Plan to go home the same day, you will only stay overnight if medically necessary. 6. Bring a current list of your medications and current insurance cards. 7. You MUST have a responsible person to drive you home. 8. Someone MUST be with you the first 24 hours after you arrive home or your discharge will be delayed. 9. Please wear clothes that are easy to get on and off and wear slip-on shoes.  Thank you for allowing [place] to care for you!   -- [facility]   Medications: No changes   Follow up appointments:  Your physician recommends that you schedule a follow-up appointment in:  4 weeks after heart catherization   Do the following things EVERYDAY: Weigh yourself in the morning before breakfast. Write it down and keep it in a log. Take your medicines as prescribed Eat low salt foods--Limit salt (sodium) to 2000 mg per day.  Stay as active as you can everyday Limit all fluids for the day to less than 2 liters   At the [facility], you and your health needs are our priority. As part of our continuing mission to provide you with exceptional heart care, we have created designated Provider Care Teams.  These Care Teams include your primary Cardiologist (physician) and Advanced Practice Providers (APPs- Physician Assistants and Nurse Practitioners) who all work together to provide you with the care you need, when you need it.   You may see any of the following providers on your designated Care Team at your next follow up: Dr Arvilla Meres Dr Marca Ancona Dr. Marcos Eke, NP Robbie Lis, [place] [facility] [place], [place] Brynda Peon, NP Karle Plumber, PharmD   Please be sure to bring in all your medications bottles to every appointment.    Thank you for choosing [facility]  If you have any questions or concerns before your next appointment please send [place] a message through [place] or call our office at [phone].    TO LEAVE A MESSAGE FOR THE NURSE SELECT OPTION 2, PLEASE LEAVE A MESSAGE INCLUDING: YOUR NAME DATE OF BIRTH CALL BACK NUMBER REASON FOR CALL**this is important as we prioritize the call backs  YOU WILL RECEIVE A CALL BACK THE SAME DAY AS LONG AS YOU CALL BEFORE 4:00 PM

## 2023-09-11 NOTE — Progress Notes (Signed)
ReDS Vest / Clip - 09/11/23 1500       ReDS Vest / Clip   Station Marker A    Ruler Value 21.5    ReDS Value Range Moderate volume overload    ReDS Actual Value 40

## 2023-09-11 NOTE — Progress Notes (Signed)
ADVANCED HF CLINIC NOTE  Primary Care: Sara Bass, MD Primary Cardiologist: Dr. Anne Griffin HF Cardiologist: Dr. Gala Griffin  HPI: Sara Griffin is a 89 y.o. female with chronic AF (on Xarelto), HTN, HL, renal cell CA s/p nephrectomy, HFrEF, PVD s/p popliteal occlusion with mechanical thrombectomy mild carotid disease (1-39%) , moderate MR/TR, CKD stage III who is referred by Dr. Blenda Griffin for further evaluation of her PH.   Sara Griffin was previously followed by Dr. Shari Griffin. She had echo completed at [facility] that showed EF of 40% with apical akinesis and severely dilated LA/RA, mild AR, mild MR and severely elevated PASP of 70 mmHg. She was previously hospitalized on 01/2023 for anemia in the setting of a GI bleed and Xarelto was held several days.  During follow-up she reported not feeling well with occasional lower extremity edema.  Patient had requested to stop Xarelto due to continued GI bleed and advanced age.  She was admitted on 05/14/2023 with complaint of right lower extremity pain.  She had a CTA completed that showed short segment popliteal occlusion with critical stenosis of the TP trunk. She underwent a R LE angioplasty with suction thrombectomy.  She was discharged on Xarelto was reinitiated.  She was admitted to [facility] in 9/24 for CHF exacerbation and 10 lb weight gain with shortness of breath.  She was treated with Lasix and has been using compression stockings.   Initial visit with Dr. Gala Griffin 10/24, NYHA IIIb with marked volume overload. Lasix switched to torsemide. Given Furoscix x 3 days.   Unfortunately, she was admitted to [facility] for a/c HF. Diuresed with IV lasix, required R thoracentesis.   Today she returns for post hospital HF follow up with her son and daughter in law. Overall feeling wiped out and "bad." She is not SOB at rest, she has SOB with ADLs and walking short distances on flat ground. Legs and abdomen swelling. She lives alone, has  a walker and a cane. Wears 2L oxygen continuously. She has mild BPBpR.  Denies palpitations, CP, dizziness, or PND/Orthopnea. Appetite ok. No fever or chills. Weight at home 115-119 pounds. Taking all medications. Wears oxygen at night.   Had PFTs at Pulmonary appt today Pre: FVC  1.28 (56%) FEV1 1.12 (67%) FEV1/FVC 88 (122%) DLCO 44% pred  Post: FVC 1.67 (73%) FEV1 1.18 (71%) FEV1/FVC 71 (98%)  Past Medical History:  Diagnosis Date   Cardiomyopathy (HCC)    Echo 1/19: mild LVH, EF 40-45, ant-sept DK, mild AI, MAC, mod MR, severe BAE, mod TR   Chronic atrial fibrillation (HCC)    Chronic systolic CHF (congestive heart failure) (HCC)    a. dx 10/2017 but sx dating back to 08/2017.   CKD (chronic kidney disease), stage III (HCC)    a. ? CKD - baseline Cr 0.8-1.1.   Dysrhythmia    Heart murmur    Hx of colonic polyps    Hyperlipidemia    Hypertension    Hypothyroidism    Mild carotid artery disease (HCC)    a. Duplex 2014 mild carotid plaque 0-39% BICA.   Moderate mitral regurgitation 10/2017   Moderate tricuspid regurgitation 10/2017   PVC's (premature ventricular contractions)    Uterine cancer (HCC)    Varicose veins of leg with pain, bilateral    Current Outpatient Medications  Medication Sig Dispense Refill   ASPIRIN 81 PO Take 1 tablet by mouth daily.     Cholecalciferol 125 MCG (5000 UT) capsule Take 5,000 Units  by mouth daily.     diltiazem (CARDIZEM CD) 120 MG 24 hr capsule Take 1 capsule (120 mg total) by mouth 2 (two) times daily. 180 capsule 3   ferrous sulfate 325 (65 FE) MG EC tablet Take 325 mg by mouth 2 (two) times daily.     levothyroxine (SYNTHROID) 25 MCG tablet Take 1 tablet (25 mcg total) by mouth daily before breakfast. 90 tablet 2   polyethylene glycol (MIRALAX / GLYCOLAX) 17 g packet Take 17 g by mouth as needed.     potassium chloride SA (KLOR-CON M) 20 MEQ tablet Take 1 tablet (20 mEq total) by mouth 2 (two) times daily. 180 tablet 3   Rivaroxaban  (XARELTO) 15 MG TABS tablet Take 15 mg by mouth daily with supper.     torsemide (DEMADEX) 20 MG tablet Take 2 tablets (40 mg total) by mouth 2 (two) times daily. 120 tablet 3   UNABLE TO FIND Med Name: Benefiber 2 teaspoons by mouth daily     No current facility-administered medications for this encounter.   Allergies  Allergen Reactions   Tegretol [Carbamazepine]     Almost comatose   Hydrocod Poli-Chlorphe Poli Er Other (See Comments)    Felt bad   Social History   Socioeconomic History   Marital status: Widowed    Spouse name: Not on file   Number of children: 5   Years of education: Not on file   Highest education level: Not on file  Occupational History   Not on file  Tobacco Use   Smoking status: Never   Smokeless tobacco: Never  Vaping Use   Vaping status: Never Used  Substance and Sexual Activity   Alcohol use: No   Drug use: No   Sexual activity: Not on file  Other Topics Concern   Not on file  Social History Narrative   Not on file   Social Determinants of Health   Financial Resource Strain: Not on file  Food Insecurity: Low Risk  (06/21/2023)   Received from [facility]   Hunger Vital Sign    Worried About Running Out of Food in the Last Year: Never true    Ran Out of Food in the Last Year: Never true  Transportation Needs: No Transportation Needs (06/21/2023)   Received from [facility]    In the past 12 months, has lack of reliable transportation kept you from medical appointments, meetings, work or from getting things needed for daily living? : No  Physical Activity: Not on file  Stress: Not on file  Social Connections: Not on file  Intimate Partner Violence: Not on file   Family History  Problem Relation Age of Onset   Heart attack Father 41       died   Heart attack Son 62       died   Diabetes Son    Heart Problems Son    Heart disease Other        famil,y hx of   BP (!) 140/72   Pulse 88   Wt 57.3 kg (126 lb 6.4  oz)   SpO2 93%   BMI 23.12 kg/m   Wt Readings from Last 3 Encounters:  09/11/23 57.3 kg (126 lb 6.4 oz)  08/16/23 62 kg (136 lb 9.6 oz)  07/05/23 58.7 kg (129 lb 6.4 oz)   PHYSICAL EXAM: General:  NAD. No resp difficulty, arrived in [facility], elderly on oxygen, fatigued-appearing HEENT: Normal Neck: Supple. No JVD, +cv  waves. Carotids 2+ bilat; no bruits. No lymphadenopathy or thryomegaly appreciated. Cor: PMI nondisplaced. Irregular rate & rhythm. No rubs, gallops, 2/6 TR Lungs: Clear Abdomen: Soft, nontender, nondistended. No hepatosplenomegaly. No bruits or masses. Good bowel sounds. Extremities: No cyanosis, clubbing, rash, trace ankle edema Neuro: Alert & oriented x 3, cranial nerves grossly intact. Moves all 4 extremities w/o difficulty. Affect pleasant.  ECG (personally reviewed): atrial fibrillation 81 bpm  ReDs: 40%  ASSESSMENT & PLAN: 1.  Pulmonary HTN - Echo 2024 at [facility] EF 40%-45% with apical akinesis and severely dilated LA/RA, mild AR, mild MR and severely elevated PASP of 70 mmHg.  - Dr. Gala Griffin reviewed her echo, she has clear evidence of restrictive cardiomyopathy with elevated filling pressures - Suspect primarily WHO Group II PAH - Had PFTs today with Dr. Esther Hardy, consistent with restrictive lung disease - NYHA III-IIIb, functional status confounded by physical deconditioning and advanced age. Volume improved. Weight down 10 lbs, ReDs 40%. Had recent thoracentesis. - Continue torsemide 40 mg bid + 20 KCL bid - ? Role for digoxin? - Continue compression hose - Check labs. - Discussed RHC vs volume management. She and family prefer cath. Will arrange with Dr. Gala Griffin - Hold off on SGLT2i until after cath. Informed Consent   Shared Decision Making/Informed Consent The risks [stroke (1 in 1000), death (1 in 1000), kidney failure [usually temporary] (1 in 500), bleeding (1 in 200), allergic reaction [possibly serious] (1 in 200)], benefits (diagnostic support  and management of coronary artery disease) and alternatives of a cardiac catheterization were discussed in detail with Ms. Maciver and she is willing to proceed.    2. Chronic diastolic HF - Restrictive physiology on echo. - Continue diuretics, as above. - Can consider SGLT2i in future, though need to be careful in setting of advanced age   38.  Permanent AF: - Rate controlled on cardizem 120 mg bid. - Continue Xarelto. No bleeding issues. - CHA2DS2-VASc Score = 6. Therefore, the patient's annual risk of stroke is 9.7 %.     4.  PAD - s/p Right acute ischemic popliteal occlusion - s/p mechanical thrombectomy on 05/15/2023 - Stable followed by [facility]   5.  Essential hypertension: - Blood pressure well controlled.  - Continue current regimen.  Arrange RHC with Dr. Gala Griffin. Follow up 3-4 weeks after cath with APP.  Jacklynn Ganong, FNP  3:14 PM  Greater than 50% of the (total minutes 40) visit spent in counseling/coordination of care regarding (PH, management and RHC risks/benefits, diuretic management).

## 2023-09-12 ENCOUNTER — Telehealth (HOSPITAL_COMMUNITY): Payer: Self-pay | Admitting: *Deleted

## 2023-09-12 DIAGNOSIS — Z79899 Other long term (current) drug therapy: Secondary | ICD-10-CM

## 2023-09-12 DIAGNOSIS — I5032 Chronic diastolic (congestive) heart failure: Secondary | ICD-10-CM

## 2023-09-12 MED ORDER — METOLAZONE 2.5 MG PO TABS: 2.5000 mg | ORAL_TABLET | Freq: Once | ORAL | 3 refills | Status: DC

## 2023-09-12 NOTE — Telephone Encounter (Signed)
Called patient's son per Prince Rome, NP with following lab results and instructions:  "BNP back up and renal function declined. Concern for on-going volume overload and cardio-renal syndrome. Will attempt to pull more fluid to see if this will make her feel better until she has her cath.  Give metolazone 2.5 mg + extra 40 KCL x 1 dose today (take with one of her doses of torsemide). This is a super pill that will help pull off more fluid. Repeat BMET in 1 week.  Son verbalized he had seen results in MyChart. He verbalized understanding of above. Rx sent to local pharmacy. Son requested repeat BMET be ordered at [facility] in [place]. Asked patient to call [phone] option 2 if any questions or concerns prior to next visit.

## 2023-09-13 ENCOUNTER — Encounter (HOSPITAL_COMMUNITY): Payer: Self-pay | Admitting: Cardiology

## 2023-09-13 ENCOUNTER — Other Ambulatory Visit (HOSPITAL_COMMUNITY): Payer: Self-pay | Admitting: Cardiology

## 2023-09-13 ENCOUNTER — Encounter (HOSPITAL_COMMUNITY): Payer: Self-pay

## 2023-09-13 DIAGNOSIS — Z79899 Other long term (current) drug therapy: Secondary | ICD-10-CM

## 2023-09-13 DIAGNOSIS — I5032 Chronic diastolic (congestive) heart failure: Secondary | ICD-10-CM

## 2023-09-18 NOTE — Telephone Encounter (Signed)
Questions addressed son Repeat labs to be drawn in [place] cvd

## 2023-09-25 ENCOUNTER — Telehealth (HOSPITAL_COMMUNITY): Payer: Self-pay | Admitting: Cardiology

## 2023-09-25 ENCOUNTER — Other Ambulatory Visit: Payer: Self-pay

## 2023-09-25 DIAGNOSIS — I5042 Chronic combined systolic (congestive) and diastolic (congestive) heart failure: Secondary | ICD-10-CM

## 2023-09-25 NOTE — Telephone Encounter (Signed)
Pts family called to cancel procedure Would like to schedule follow up with provider Rip Harbour with MD transition appt with Elwyn Lade   Scheduling to make fu appt  Procedure canceled with Clydie Braun in cath lab @ 1245

## 2023-09-26 LAB — BASIC METABOLIC PANEL
BUN/Creatinine Ratio: 26 (ref 12–28)
BUN: 58 mg/dL — ABNORMAL HIGH (ref 10–36)
CO2: 26 mmol/L (ref 20–29)
Calcium: 8.8 mg/dL (ref 8.7–10.3)
Chloride: 95 mmol/L — ABNORMAL LOW (ref 96–106)
Creatinine, Ser: 2.21 mg/dL — ABNORMAL HIGH (ref 0.57–1.00)
Glucose: 117 mg/dL — ABNORMAL HIGH (ref 70–99)
Potassium: 3.3 mmol/L — ABNORMAL LOW (ref 3.5–5.2)
Sodium: 141 mmol/L (ref 134–144)
eGFR: 21 mL/min/{1.73_m2} — ABNORMAL LOW (ref >59)

## 2023-09-27 ENCOUNTER — Encounter (HOSPITAL_COMMUNITY): Payer: Self-pay | Admitting: Cardiology

## 2023-09-27 ENCOUNTER — Ambulatory Visit (HOSPITAL_COMMUNITY)
Admission: RE | Admit: 2023-09-27 | Discharge: 2023-09-27 | Disposition: A | Discharge status: Home / Self Care | Payer: Medicare Other | Source: Ambulatory Visit | Attending: Cardiology | Admitting: Cardiology

## 2023-09-27 DIAGNOSIS — N1832 Chronic kidney disease, stage 3b: Secondary | ICD-10-CM | POA: Insufficient documentation

## 2023-09-27 DIAGNOSIS — Z79899 Other long term (current) drug therapy: Secondary | ICD-10-CM | POA: Diagnosis not present

## 2023-09-27 DIAGNOSIS — I272 Pulmonary hypertension, unspecified: Secondary | ICD-10-CM | POA: Diagnosis not present

## 2023-09-27 DIAGNOSIS — I130 Hypertensive heart and chronic kidney disease with heart failure and stage 1 through stage 4 chronic kidney disease, or unspecified chronic kidney disease: Secondary | ICD-10-CM | POA: Diagnosis not present

## 2023-09-27 DIAGNOSIS — E785 Hyperlipidemia, unspecified: Secondary | ICD-10-CM | POA: Diagnosis not present

## 2023-09-27 DIAGNOSIS — I739 Peripheral vascular disease, unspecified: Secondary | ICD-10-CM | POA: Insufficient documentation

## 2023-09-27 DIAGNOSIS — I482 Chronic atrial fibrillation, unspecified: Secondary | ICD-10-CM | POA: Diagnosis present

## 2023-09-27 DIAGNOSIS — I4821 Permanent atrial fibrillation: Secondary | ICD-10-CM | POA: Insufficient documentation

## 2023-09-27 DIAGNOSIS — Z7901 Long term (current) use of anticoagulants: Secondary | ICD-10-CM | POA: Insufficient documentation

## 2023-09-27 DIAGNOSIS — I5032 Chronic diastolic (congestive) heart failure: Secondary | ICD-10-CM | POA: Diagnosis not present

## 2023-09-27 DIAGNOSIS — I110 Hypertensive heart disease with heart failure: Secondary | ICD-10-CM | POA: Diagnosis present

## 2023-09-27 MED ORDER — METOLAZONE 2.5 MG PO TABS: 2.5000 mg | ORAL_TABLET | ORAL | 3 refills | Status: DC

## 2023-09-27 NOTE — Progress Notes (Signed)
ADVANCED HEART FAILURE FOLLOW UP CLINIC NOTE  Referring Physician: Olive Bass, MD  Primary Care: Olive Bass, MD Primary Cardiologist:  HPI: Sara Griffin is a 89 y.o. female with chronic AF (on Xarelto), HTN, HL, renal cell CA s/p nephrectomy, HFrEF, PVD s/p popliteal occlusion with mechanical thrombectomy mild carotid disease (1-39%) , moderate MR/TR, CKD stage III who presents for follow up of heart failure.      Sara Griffin was previously followed by Dr. Shari Prows. She had echo completed at [facility] that showed EF of 40% with apical akinesis and severely dilated LA/RA, mild AR, mild MR and severely elevated PASP of 70 mmHg. She was previously hospitalized on 01/2023 for anemia in the setting of a GI bleed and Xarelto was held several days.  During follow-up she reported not feeling well with occasional lower extremity edema.  Patient had requested to stop Xarelto due to continued GI bleed and advanced age.  She was admitted on 05/14/2023 with complaint of right lower extremity pain.  She had a CTA completed that showed short segment popliteal occlusion with critical stenosis of the TP trunk. She underwent a R LE angioplasty with suction thrombectomy.  She was discharged on Xarelto was reinitiated.  She was admitted to [facility] in 9/24 for CHF exacerbation and 10 lb weight gain with shortness of breath.  She was treated with Lasix and has been using compression stockings.    Initial visit with Dr. Gala Romney 10/24, NYHA IIIb with marked volume overload. Lasix switched to torsemide. Given Furoscix x 3 days.    Unfortunately, she was admitted to [facility] for a/c HF. Diuresed with IV lasix, required R thoracentesis.      SUBJECTIVE:  Patient notes that she is not feeling well today.  She is persistently fatigued, has a nonproductive cough, and is unable to lie flat at night.  She reports that she has been gaining weight and has more lower extremity swelling  over the past few days.  When she takes her torsemide she does not urinate as much as he used to.  She is accompanied by her youngest and oldest sons today.  We had a long discussion about her restrictive cardiomyopathy, worsening CKD, and decreasing response to diuretics.  We talked about the natural history of heart failure and how a right heart cath is unlikely to provide any additional information.  We will plan on escalating diuretic therapy with close follow-up.  PMH, current medications, allergies, social history, and family history reviewed in epic.  PHYSICAL EXAM: Vitals:   09/27/23 1111  BP: (!) 140/78  Pulse: 79  SpO2: 91%   GENERAL: Well nourished and in no apparent distress at rest.  HEENT: The mucous membranes are pink and moist.   PULM:  Normal work of breathing, clear to auscultation bilaterally. Respirations are unlabored.  CARDIAC:  JVP: Moderately elevated with v waves   ABDOMEN: Soft, non-tender, non-distended. NEUROLOGIC: Patient is oriented x3 with no focal or lateralizing neurologic deficits.  PSYCH: Patients affect is appropriate, there is no evidence of anxiety or depression.  SKIN: Warm and dry; no lesions or wounds. Warm and well perfused extremities.  DATA REVIEW  ECG: ***  As per my personal interpretation  ECHO: *** As per my personal interpretation  CATH: *** As per my personal interpretation  Heart failure review: - Classification: {HFCLASS:30917} - Etiology: {Cardiomyopathy:30918} - NYHA Class:  - Volume status: {volumechf:30919} - ACEi/ARB/ARNI: {HF:30752} - Aldosterone antagonist: {HF:30752} - Beta-blocker: {HF:30752} -  Digoxin: {HF:30752} - Hydralazine/Nitrates: {HF:30752} - SGLT2i: {HF:30752} - GLP-1: {GLP:30906} - Advanced therapies: {Advancedtherapies:30916} - ICD: {ICD:30901}  ASSESSMENT & PLAN:  ***    Clearnce Hasten, MD Advanced Heart Failure Mechanical Circulatory Support 09/27/23

## 2023-09-27 NOTE — Patient Instructions (Signed)
Take Metolazone 2.5 mg today and tomorrow, then change it to every Monday and Thursday.  Take an extra 2 potassium tablets on these days.  Your physician recommends that you schedule a follow-up appointment in: as scheduled.  If you have any questions or concerns before your next appointment please send [place] a message through [place] or call our office at [phone].    TO LEAVE A MESSAGE FOR THE NURSE SELECT OPTION 2, PLEASE LEAVE A MESSAGE INCLUDING: YOUR NAME DATE OF BIRTH CALL BACK NUMBER REASON FOR CALL**this is important as we prioritize the call backs  YOU WILL RECEIVE A CALL BACK THE SAME DAY AS LONG AS YOU CALL BEFORE 4:00 PM  At the [facility], you and your health needs are our priority. As part of our continuing mission to provide you with exceptional heart care, we have created designated Provider Care Teams. These Care Teams include your primary Cardiologist (physician) and Advanced Practice Providers (APPs- Physician Assistants and Nurse Practitioners) who all work together to provide you with the care you need, when you need it.   You may see any of the following providers on your designated Care Team at your next follow up: Dr Arvilla Meres Dr Marca Ancona Dr. Dorthula Nettles Dr. Clearnce Hasten Amy Filbert Schilder, NP Robbie Lis, [facility], [place] Brynda Peon, NP [place] Lee, NP Karle Plumber, PharmD   Please be sure to bring in all your medications bottles to every appointment.    Thank you for choosing [facility]

## 2023-09-28 ENCOUNTER — Ambulatory Visit: Payer: Medicare Other | Admitting: Cardiology

## 2023-10-02 ENCOUNTER — Ambulatory Visit (HOSPITAL_COMMUNITY): Admit: 2023-10-02 | Payer: Medicare Other | Admitting: Internal Medicine

## 2023-10-02 ENCOUNTER — Encounter (HOSPITAL_COMMUNITY): Payer: Self-pay

## 2023-10-02 SURGERY — RIGHT HEART CATH
Anesthesia: LOCAL

## 2023-10-23 ENCOUNTER — Encounter (HOSPITAL_COMMUNITY): Payer: Medicare Other

## 2023-10-30 ENCOUNTER — Encounter (HOSPITAL_COMMUNITY): Payer: Medicare Other

## 2023-10-31 ENCOUNTER — Ambulatory Visit: Payer: Medicare Other | Admitting: Cardiology

## 2023-11-25 DEATH — deceased
